# Patient Record
Sex: Female | Born: 1940 | Race: White | Hispanic: No | Smoking: Current every day smoker
Health system: Southern US, Community
[De-identification: ages and names within clinical notes are randomized; demographics above are authoritative.]

## PROBLEM LIST (undated history)

## (undated) DIAGNOSIS — E119 Type 2 diabetes mellitus without complications: Secondary | ICD-10-CM

## (undated) DIAGNOSIS — Z5189 Encounter for other specified aftercare: Secondary | ICD-10-CM

## (undated) DIAGNOSIS — I10 Essential (primary) hypertension: Secondary | ICD-10-CM

## (undated) DIAGNOSIS — I639 Cerebral infarction, unspecified: Secondary | ICD-10-CM

## (undated) DIAGNOSIS — J449 Chronic obstructive pulmonary disease, unspecified: Secondary | ICD-10-CM

## (undated) DIAGNOSIS — M1990 Unspecified osteoarthritis, unspecified site: Secondary | ICD-10-CM

## (undated) HISTORY — DX: Encounter for other specified aftercare: Z51.89

## (undated) HISTORY — DX: Type 2 diabetes mellitus without complications: E11.9

## (undated) HISTORY — DX: Unspecified osteoarthritis, unspecified site: M19.90

## (undated) HISTORY — DX: Essential (primary) hypertension: I10

## (undated) HISTORY — PX: CHOLECYSTECTOMY: SHX55

## (undated) HISTORY — PX: OTHER SURGICAL HISTORY: SHX169

---

## 2015-03-08 ENCOUNTER — Ambulatory Visit (INDEPENDENT_AMBULATORY_CARE_PROVIDER_SITE_OTHER): Payer: Medicare Other

## 2015-03-08 ENCOUNTER — Ambulatory Visit (INDEPENDENT_AMBULATORY_CARE_PROVIDER_SITE_OTHER): Payer: Medicare Other | Admitting: Family Medicine

## 2015-03-08 VITALS — BP 129/72 | HR 52 | Temp 98.1°F | Resp 20 | Ht 63.0 in | Wt 145.0 lb

## 2015-03-08 DIAGNOSIS — J189 Pneumonia, unspecified organism: Secondary | ICD-10-CM

## 2015-03-08 DIAGNOSIS — R05 Cough: Secondary | ICD-10-CM

## 2015-03-08 DIAGNOSIS — E119 Type 2 diabetes mellitus without complications: Secondary | ICD-10-CM | POA: Insufficient documentation

## 2015-03-08 DIAGNOSIS — J441 Chronic obstructive pulmonary disease with (acute) exacerbation: Secondary | ICD-10-CM | POA: Diagnosis not present

## 2015-03-08 DIAGNOSIS — I10 Essential (primary) hypertension: Secondary | ICD-10-CM | POA: Insufficient documentation

## 2015-03-08 DIAGNOSIS — Z8719 Personal history of other diseases of the digestive system: Secondary | ICD-10-CM | POA: Insufficient documentation

## 2015-03-08 DIAGNOSIS — R058 Other specified cough: Secondary | ICD-10-CM

## 2015-03-08 LAB — POCT CBC
GRANULOCYTE PERCENT: 52 % (ref 37–80)
HCT, POC: 37.1 % — AB (ref 37.7–47.9)
Hemoglobin: 12.3 g/dL (ref 12.2–16.2)
Lymph, poc: 1.9 (ref 0.6–3.4)
MCH, POC: 29.2 pg (ref 27–31.2)
MCHC: 33.1 g/dL (ref 31.8–35.4)
MCV: 88 fL (ref 80–97)
MID (CBC): 0.1 (ref 0–0.9)
MPV: 7.4 fL (ref 0–99.8)
PLATELET COUNT, POC: 162 10*3/uL (ref 142–424)
POC GRANULOCYTE: 2.1 (ref 2–6.9)
POC LYMPH %: 46.2 % (ref 10–50)
POC MID %: 1.8 % (ref 0–12)
RBC: 4.21 M/uL (ref 4.04–5.48)
RDW, POC: 13.9 %
WBC: 4.1 10*3/uL — AB (ref 4.6–10.2)

## 2015-03-08 MED ORDER — BENZONATATE 200 MG PO CAPS: 200.0000 mg | ORAL_CAPSULE | Freq: Three times a day (TID) | ORAL | Status: DC | PRN

## 2015-03-08 MED ORDER — IPRATROPIUM-ALBUTEROL 0.5-2.5 (3) MG/3ML IN SOLN
3.0000 mL | Freq: Once | RESPIRATORY_TRACT | Status: AC
  Administered 2015-03-08: 3 mL via RESPIRATORY_TRACT

## 2015-03-08 MED ORDER — METHYLPREDNISOLONE ACETATE 80 MG/ML IJ SUSP
80.0000 mg | Freq: Once | INTRAMUSCULAR | Status: AC
  Administered 2015-03-08: 80 mg via INTRAMUSCULAR

## 2015-03-08 MED ORDER — ALBUTEROL SULFATE HFA 108 (90 BASE) MCG/ACT IN AERS: 2.0000 | INHALATION_SPRAY | Freq: Four times a day (QID) | RESPIRATORY_TRACT | Status: DC | PRN

## 2015-03-08 MED ORDER — PREDNISONE 20 MG PO TABS: 20.0000 mg | ORAL_TABLET | Freq: Every day | ORAL | Status: DC

## 2015-03-08 MED ORDER — LEVOFLOXACIN 500 MG PO TABS: 500.0000 mg | ORAL_TABLET | Freq: Every day | ORAL | Status: DC

## 2015-03-08 MED ORDER — IPRATROPIUM BROMIDE 0.02 % IN SOLN
0.5000 mg | Freq: Once | RESPIRATORY_TRACT | Status: AC
  Administered 2015-03-08: 0.5 mg via RESPIRATORY_TRACT

## 2015-03-08 NOTE — Progress Notes (Signed)
Stefanie Vincent - 74 y.o. female MRN [id number]  Date of birth: 1941/01/06  SUBJECTIVE:  Including CC & ROS.  Patient is a 74 year old Caucasian female with past medical history significant for hypertension, type 2 diabetes poorly controlled, arthritis, seasonal allergies, and 50 year pack history of tobacco abuse. Patient presents office today complaining of upper respiratory congestion, lower respiratory cough productive in nature with thick sputum. History of pneumonia in the past last in September 2015 aren't hospitalization. Patient reports symptoms started on Sunday with increased fatigue, malaise, and cough and respiratory congestion. Continued to have the symptoms along with rhinorrhea, mild fever of 100.1, poor by mouth intake, nausea, and brief episode of vomiting on Monday. Patient is not taking any medications over-the-counter for the symptoms. Denies any severe shortness of breath or dyspnea on exertion, denies any chest pain, does report some pleuritic chest pain with productive cough. Patient has discontinue smoking briefly.   ROS:  Constitutional:  Yes fever, chills, malaise, and fatigue.  Respiratory:  No shortness of breath, yes cough, yes wheezing Cardiovascular:  No palpitations, chest pain or syncope Gastrointestinal: some vomiting, yes nausea, no abdominal pain Review of systems otherwise negative except for what is stated in HPI  HISTORY: Past Medical, Surgical, Social, and Family History Reviewed & Updated per EMR. Pertinent Historical Findings include: See history of present illness  PHYSICAL EXAM:  VS: BP:129/72 mmHg  HR:(!) 52bpm  TEMP:98.1 F (36.7 C)(Oral)  RESP:92 %  HT:5\' 3"  (160 cm)   WT:145 lb (65.772 kg)  BMI:25.7 PHYSICAL EXAM: General:  Alert and oriented, No acute distress.   HENT:  Normocephalic, Oral mucosa is moist. Eyes are equal and reactive to light, normal conjunctivae, normal hearing, mucous membrane is moist, no erythema, no exudate.  Bilateral  ears have fluid with bulging but no erythema, TMs are intact.  Both nasal passages are inflamed, erythematous, with clear drainage and inflamed turbinate.  Sinus passages are tender to palpation.  Submandibular glands are fluctuant mobile and soft.  Respiratory:  Bilateral lung auscultation reveals significant rhonchi, mild expiratory wheezing, mild labored breathing, symmetric lung expansion  Cardiovascular:  Normal rate, Regular rhythm, No murmur, Good pulses equal in all extremities, No edema.   Gastrointestinal:  Soft, Non-tender, Non-distended, Normal bowel sounds, No organomegaly.   Integumentary:  Warm, Dry, No rash.  Really not febrile Neurologic:  Alert, Oriented, No focal defects Psychiatric:  Cooperative, Appropriate mood & affect.    [facility] reading (PRIMARY) by Dr. Ollen Barges 2 view of the chest: Bilateral lower lobe pneumonia specifically in the lingula.   Results for orders placed or performed in visit on 03/08/15  POCT CBC  Result Value Ref Range   WBC 4.1 (A) 4.6 - 10.2 K/uL   Lymph, poc 1.9 0.6 - 3.4   POC LYMPH PERCENT 46.2 10 - 50 %L   MID (cbc) 0.1 0 - 0.9   POC MID % 1.8 0 - 12 %M   POC Granulocyte 2.1 2 - 6.9   Granulocyte percent 52.0 37 - 80 %G   RBC 4.21 4.04 - 5.48 M/uL   Hemoglobin 12.3 12.2 - 16.2 g/dL   HCT, POC 37.1 (A) 37.7 - 47.9 %   MCV 88.0 80 - 97 fL   MCH, POC 29.2 27 - 31.2 pg   MCHC 33.1 31.8 - 35.4 g/dL   RDW, POC 13.9 %   Platelet Count, POC 162 142 - 424 K/uL   MPV 7.4 0 - 99.8 fL    ASSESSMENT &  PLAN:  In office patient was treated with a DuoNeb and treated with 80 mg IM Depo-Medrol. Following DuoNeb treatment patient's oxygen level remained between 93 and 96% with ambulation.  Impression: Bilateral lower lobe pneumonia community-acquired No severe respiratory distress COPD acute exacerbation History of poorly controlled diabetes  Recommendations: -Started patient on community-acquired pneumonia treatment with Levaquin broad-spectrum  given patient's comorbidities type 2 diabetes and COPD. -Started on a prednisone dose obstructive lung disease. Also started on albuterol inhaler treatment every 6 hours. -Given precautions signs of worsening pneumonia and shortness of breath that would prompt emergency room evaluation -Patient will follow-up in 24-48 hours for reevaluation in our office. -CBC obtained shows no leukocytosis, normal H&H.

## 2015-03-08 NOTE — Patient Instructions (Addendum)
Antibiotic treatment with Levaquin 500 mg twice a day for 10 days  Obstructive lung disease treatment with prednisone 20 mg daily for 7 days  Obstructive lung disease treatment with albuterol inhaler give 2 puffs every 6 hours for 5 days  Precautions of symptoms worsening include increased fever, body aches, chills. Worsening cough respiratory breathing with shortness of breath.

## 2015-03-12 ENCOUNTER — Inpatient Hospital Stay (HOSPITAL_COMMUNITY)
Admission: EM | Admit: 2015-03-12 | Discharge: 2015-03-14 | DRG: 190 | Disposition: A | Discharge status: Home / Self Care | Payer: Medicare Other | Attending: Internal Medicine | Admitting: Internal Medicine

## 2015-03-12 ENCOUNTER — Encounter (HOSPITAL_COMMUNITY): Payer: Self-pay | Admitting: Emergency Medicine

## 2015-03-12 ENCOUNTER — Emergency Department (HOSPITAL_COMMUNITY): Payer: Medicare Other

## 2015-03-12 DIAGNOSIS — E11649 Type 2 diabetes mellitus with hypoglycemia without coma: Secondary | ICD-10-CM | POA: Diagnosis present

## 2015-03-12 DIAGNOSIS — E785 Hyperlipidemia, unspecified: Secondary | ICD-10-CM | POA: Diagnosis present

## 2015-03-12 DIAGNOSIS — E871 Hypo-osmolality and hyponatremia: Secondary | ICD-10-CM | POA: Diagnosis present

## 2015-03-12 DIAGNOSIS — F17210 Nicotine dependence, cigarettes, uncomplicated: Secondary | ICD-10-CM | POA: Diagnosis present

## 2015-03-12 DIAGNOSIS — M1990 Unspecified osteoarthritis, unspecified site: Secondary | ICD-10-CM | POA: Diagnosis present

## 2015-03-12 DIAGNOSIS — Z9049 Acquired absence of other specified parts of digestive tract: Secondary | ICD-10-CM | POA: Diagnosis present

## 2015-03-12 DIAGNOSIS — T502X5A Adverse effect of carbonic-anhydrase inhibitors, benzothiadiazides and other diuretics, initial encounter: Secondary | ICD-10-CM | POA: Diagnosis present

## 2015-03-12 DIAGNOSIS — Z794 Long term (current) use of insulin: Secondary | ICD-10-CM

## 2015-03-12 DIAGNOSIS — I10 Essential (primary) hypertension: Secondary | ICD-10-CM | POA: Diagnosis present

## 2015-03-12 DIAGNOSIS — J441 Chronic obstructive pulmonary disease with (acute) exacerbation: Secondary | ICD-10-CM | POA: Diagnosis not present

## 2015-03-12 DIAGNOSIS — J189 Pneumonia, unspecified organism: Secondary | ICD-10-CM | POA: Diagnosis present

## 2015-03-12 LAB — CBC WITH DIFFERENTIAL/PLATELET
Basophils Absolute: 0 10*3/uL (ref 0.0–0.1)
Basophils Relative: 0 % (ref 0–1)
EOS ABS: 0 10*3/uL (ref 0.0–0.7)
EOS PCT: 0 % (ref 0–5)
HCT: 35.7 % — ABNORMAL LOW (ref 36.0–46.0)
HEMOGLOBIN: 12.1 g/dL (ref 12.0–15.0)
LYMPHS ABS: 1.1 10*3/uL (ref 0.7–4.0)
Lymphocytes Relative: 16 % (ref 12–46)
MCH: 28.9 pg (ref 26.0–34.0)
MCHC: 33.9 g/dL (ref 30.0–36.0)
MCV: 85.2 fL (ref 78.0–100.0)
MONO ABS: 0.3 10*3/uL (ref 0.1–1.0)
MONOS PCT: 5 % (ref 3–12)
Neutro Abs: 5.2 10*3/uL (ref 1.7–7.7)
Neutrophils Relative %: 79 % — ABNORMAL HIGH (ref 43–77)
Platelets: 179 10*3/uL (ref 150–400)
RBC: 4.19 MIL/uL (ref 3.87–5.11)
RDW: 13.3 % (ref 11.5–15.5)
WBC: 6.6 10*3/uL (ref 4.0–10.5)

## 2015-03-12 LAB — BASIC METABOLIC PANEL
Anion gap: 12 (ref 5–15)
BUN: 15 mg/dL (ref 6–20)
CO2: 26 mmol/L (ref 22–32)
Calcium: 9 mg/dL (ref 8.9–10.3)
Chloride: 90 mmol/L — ABNORMAL LOW (ref 101–111)
Creatinine, Ser: 0.96 mg/dL (ref 0.44–1.00)
GFR calc non Af Amer: 56 mL/min — ABNORMAL LOW (ref >60)
GLUCOSE: 207 mg/dL — AB (ref 70–99)
POTASSIUM: 3.7 mmol/L (ref 3.5–5.1)
SODIUM: 128 mmol/L — AB (ref 135–145)

## 2015-03-12 LAB — BRAIN NATRIURETIC PEPTIDE: B NATRIURETIC PEPTIDE 5: 175.1 pg/mL — AB (ref 0.0–100.0)

## 2015-03-12 LAB — I-STAT TROPONIN, ED: Troponin i, poc: 0 ng/mL (ref 0.00–0.08)

## 2015-03-12 LAB — GLUCOSE, CAPILLARY
GLUCOSE-CAPILLARY: 228 mg/dL — AB (ref 70–99)
Glucose-Capillary: 295 mg/dL — ABNORMAL HIGH (ref 70–99)

## 2015-03-12 MED ORDER — ALBUTEROL SULFATE (2.5 MG/3ML) 0.083% IN NEBU: 2.5000 mg | INHALATION_SOLUTION | RESPIRATORY_TRACT | Status: DC | PRN

## 2015-03-12 MED ORDER — PREDNISONE 20 MG PO TABS
40.0000 mg | ORAL_TABLET | Freq: Every day | ORAL | Status: DC
  Administered 2015-03-13 – 2015-03-14 (×2): 40 mg via ORAL
  Filled 2015-03-12 (×4): qty 2

## 2015-03-12 MED ORDER — METHYLPREDNISOLONE SODIUM SUCC 125 MG IJ SOLR
125.0000 mg | Freq: Once | INTRAMUSCULAR | Status: AC
  Administered 2015-03-12: 125 mg via INTRAVENOUS
  Filled 2015-03-12: qty 2

## 2015-03-12 MED ORDER — ATENOLOL 50 MG PO TABS
50.0000 mg | ORAL_TABLET | Freq: Every day | ORAL | Status: DC
  Administered 2015-03-13 – 2015-03-14 (×2): 50 mg via ORAL
  Filled 2015-03-12 (×2): qty 1

## 2015-03-12 MED ORDER — ATENOLOL-CHLORTHALIDONE 50-25 MG PO TABS: 1.0000 | ORAL_TABLET | Freq: Every day | ORAL | Status: DC

## 2015-03-12 MED ORDER — TIOTROPIUM BROMIDE MONOHYDRATE 18 MCG IN CAPS: 18.0000 ug | ORAL_CAPSULE | Freq: Every day | RESPIRATORY_TRACT | Status: DC

## 2015-03-12 MED ORDER — INSULIN ASPART 100 UNIT/ML ~~LOC~~ SOLN
0.0000 [IU] | SUBCUTANEOUS | Status: DC
  Administered 2015-03-12: 3 [IU] via SUBCUTANEOUS
  Administered 2015-03-12 – 2015-03-13 (×2): 5 [IU] via SUBCUTANEOUS

## 2015-03-12 MED ORDER — SODIUM CHLORIDE 0.9 % IV BOLUS (SEPSIS)
1000.0000 mL | Freq: Once | INTRAVENOUS | Status: AC
  Administered 2015-03-12: 1000 mL via INTRAVENOUS

## 2015-03-12 MED ORDER — SIMVASTATIN 20 MG PO TABS
20.0000 mg | ORAL_TABLET | Freq: Every day | ORAL | Status: DC
  Administered 2015-03-12 – 2015-03-14 (×3): 20 mg via ORAL
  Filled 2015-03-12 (×3): qty 1

## 2015-03-12 MED ORDER — IPRATROPIUM-ALBUTEROL 0.5-2.5 (3) MG/3ML IN SOLN
3.0000 mL | RESPIRATORY_TRACT | Status: DC
  Administered 2015-03-12 – 2015-03-13 (×3): 3 mL via RESPIRATORY_TRACT
  Filled 2015-03-12 (×4): qty 3

## 2015-03-12 MED ORDER — ENOXAPARIN SODIUM 40 MG/0.4ML ~~LOC~~ SOLN
40.0000 mg | SUBCUTANEOUS | Status: DC
  Administered 2015-03-12 – 2015-03-14 (×3): 40 mg via SUBCUTANEOUS
  Filled 2015-03-12 (×3): qty 0.4

## 2015-03-12 MED ORDER — CHLORTHALIDONE 25 MG PO TABS
25.0000 mg | ORAL_TABLET | Freq: Every day | ORAL | Status: DC
  Filled 2015-03-12: qty 1

## 2015-03-12 MED ORDER — IPRATROPIUM-ALBUTEROL 0.5-2.5 (3) MG/3ML IN SOLN
3.0000 mL | RESPIRATORY_TRACT | Status: DC
  Administered 2015-03-12: 3 mL via RESPIRATORY_TRACT
  Filled 2015-03-12: qty 3

## 2015-03-12 NOTE — H&P (Signed)
History and Physical  Date: 03/12/2015               Patient Name:  Stefanie Vincent MRN: [id number]  DOB: [birth year] Age / Sex: 74 y.o., female   PCP: Provider Not In System         Medical Service: Internal Medicine Teaching Service         Attending Physician: Dr. Madilyn Fireman, MD    First Contact: Reynaldo Minium Pager: [phone]  Second Contact: Dr. Ronnald Ramp Pager: [phone]       After Hours (After 5p/  First Contact Pager: [phone]  weekends / holidays): Second Contact Pager: [phone]   Chief Complaint: shortness of breath  History of Present Illness: Stefanie Vincent is a 74 y.o. female with HTN, T2DM, and a 60 pack-year smoking hx who presents with an 8-day history of SOB and cough. The pt reports DOE as well as baseline SOB. She reports fever for the first 1-2 days of her illness only. She was seen 4 days ago in Urgent Care, where she was sent out with prednisone, levofloxacin, and albuterol inhaler for presumed COPD exacerbation. Her cough was initially productive of thick sputum but since being seen at Urgent Care she denies productive cough. She reports her overall SOB has been stable over the past 8 days. She endorses sick contacts (grandson) with similar illness.  Per the patient's son, the pt has also had difficulty with correct administration of her insulin lately. He reports that at times she will intend to take 3 units and will inject 30, and that on multiple occasions she has become confused and tremulous after administration of insulin. The pt reports one episode of passing out recently; unclear if this was associated with insulin administration.  Meds: Current Facility-Administered Medications  Medication Dose Route Frequency Provider Last Rate Last Dose  . ipratropium-albuterol (DUONEB) 0.5-2.5 (3) MG/3ML nebulizer solution 3 mL  3 mL Nebulization Q4H Benjamin Cartner, PA-C   3 mL at 03/12/15 1436  . ipratropium-albuterol (DUONEB) 0.5-2.5 (3) MG/3ML nebulizer solution 3 mL   3 mL Nebulization Q4H Comer Locket, PA-C   3 mL at 03/12/15 1541   Current Outpatient Prescriptions  Medication Sig Dispense Refill  . albuterol (PROVENTIL HFA;VENTOLIN HFA) 108 (90 BASE) MCG/ACT inhaler Inhale 2 puffs into the lungs every 6 (six) hours as needed for wheezing or shortness of breath (cough, shortness of breath or wheezing.). 1 Inhaler 1  . atenolol-chlorthalidone (TENORETIC) 50-25 MG per tablet Take 1 tablet by mouth daily.    . benzonatate (TESSALON) 200 MG capsule Take 1 capsule (200 mg total) by mouth 3 (three) times daily as needed for cough. 20 capsule 0  . insulin aspart protamine- aspart (NOVOLOG MIX 70/30) (70-30) 100 UNIT/ML injection Inject 30-40 Units into the skin 2 (two) times daily with a meal. TAKES 40 UNITS IN AM AND 30 UNITS IN PM    . levofloxacin (LEVAQUIN) 500 MG tablet Take 1 tablet (500 mg total) by mouth daily. 10 tablet 0  . loperamide (IMODIUM A-D) 2 MG tablet Take 2 mg by mouth 4 (four) times daily as needed for diarrhea or loose stools.    Marland Kitchen loratadine (CLARITIN) 10 MG tablet Take 10 mg by mouth daily as needed for allergies.    . Potassium 99 MG TABS Take 1 tablet by mouth daily as needed (FOR CRAMPING).    Marland Kitchen predniSONE (DELTASONE) 20 MG tablet Take 1 tablet (20 mg total) by mouth daily with breakfast. 7 tablet 0  .  rOPINIRole (REQUIP) 0.5 MG tablet Take 0.5 mg by mouth at bedtime as needed (FOR RESTLESS LEGS).    Marland Kitchen simvastatin (ZOCOR) 20 MG tablet Take 20 mg by mouth daily.    . vitamin D, CHOLECALCIFEROL, 400 UNITS tablet Take 400 Units by mouth daily.      Allergies: Allergies as of 03/12/2015  . (No Known Allergies)   Past Medical History  Diagnosis Date  . Arthritis   . Blood transfusion without reported diagnosis   . Hypertension   . Diabetes mellitus without complication    Past Surgical History  Procedure Laterality Date  . Cholecystectomy     Family History  Problem Relation Age of Onset  . Stroke Mother   . Cancer Father     History   Social History  . Marital Status: Widowed    Spouse Name: N/A  . Number of Children: N/A  . Years of Education: N/A   Occupational History  . Not on file.   Social History Main Topics  . Smoking status: Current Every Day Smoker -- 1.00 packs/day for 60 years    Types: Cigarettes  . Smokeless tobacco: Never Used  . Alcohol Use: No  . Drug Use: No  . Sexual Activity: Not on file   Other Topics Concern  . Not on file   Social History Narrative    Review of Systems: Pertinent items are noted in HPI.  Physical Exam: Blood pressure 154/62, pulse 57, temperature 97.9 F (36.6 C), temperature source Oral, resp. rate 17, height 5\' 2"  (1.575 m), weight 145 lb (65.772 kg), SpO2 92 %. BP 154/62 mmHg  Pulse 57  Temp(Src) 97.9 F (36.6 C) (Oral)  Resp 17  Ht 5\' 2"  (1.575 m)  Wt 145 lb (65.772 kg)  BMI 26.51 kg/m2  SpO2 92%  General Appearance:    Alert, conversant woman in no apparent distress, intermittently coughing  Head:    Normocephalic, without obvious abnormality, atraumatic  Eyes:    PERRL, conjunctiva/corneas clear  Throat:   Lips, mucosa, and tongue normal; teeth and gums normal  Neck:   No JVD, no hepatojugular reflux  Lungs:     Scattered rales and wheezing. Cough with deep inspiration   Heart:    Regular rate and rhythm, S1 and S2 normal, no murmur, rub   or gallop  Abdomen:     Soft, NT/ND  Extremities:   No peripheral edema   Lab results: Basic Metabolic Panel:  Recent Labs  03/12/15 1301  NA 128*  K 3.7  CL 90*  CO2 26  GLUCOSE 207*  BUN 15  CREATININE 0.96  CALCIUM 9.0   CBC:  Recent Labs  03/12/15 1301  WBC 6.6  NEUTROABS 5.2  HGB 12.1  HCT 35.7*  MCV 85.2  PLT 179   Imaging results:  Dg Chest 2 View  03/12/2015   CLINICAL DATA:  Weakness.  Productive cough.  EXAM: CHEST  2 VIEW  COMPARISON:  None.  FINDINGS: Normal sized heart. Clear lungs. Minimal diffuse peribronchial thickening and accentuation of the interstitial  markings. Mild scoliosis. Upper lumbar spine degenerative changes. Cholecystectomy clips.  IMPRESSION: Minimal bronchitic changes.   Electronically Signed   By: Claudie Revering M.D.   On: 03/12/2015 13:29   Other results: EKG: normal sinus rhythm, 59 BPM.  Assessment & Plan by Problem: Active Problems:   COPD exacerbation  COPD Exacerbation: Pt has 60 pack-year smoking history and approximately 1 similar episode per year. She has never had PFTs.  She is s/p 5 days of daily 500 mg levofloxacin and 20 mg prednisone with improvement in her cough but no change in her shortness of breath. No chest pain, no DVT sx, no hemoptysis, no tachycardia make PE unlikely. CXR demonstrates minimal bronchitic changes, no effusion/mass/infiltrate. - Methylprednisolone 125 mg x 1, then prednisone burst 40 mg x 5 days - Duonebs q4hrs - Spiriva inhaler daily, to be continued after discharge - Albuterol rescue inhaler PRN - Continuous SpO2 monitoring; O2 via [place] for SpO2 > 92% - Pt has no local primary care - establish primary care for PFTs after d/c  Type II Diabetes Mellitus: Pt previously taking Novolog 70/30, 30-40 units BID with meals. Per pt's son, pt does not have good understanding of how much insulin to take and has had episodes of confusion and tremor after insulin injection. - Hgb A1c - SSI-R while admitted, reassess insulin requirement - Diabetes educator c/s for teaching  Hyponatremia: Pt presented with serum sodium of 128. s/p 1L NS in ED. Etiology unclear - pt clinically euvolemic, potentially SIADH. No previous serum sodium values in EHR. No e/o intraparenchymal lung pathology a/w SIADH. - Serum osmolality, urine osmolality, urine sodium, re-check BMP  Hypertension: Continue home Tenoretic 50-25 mg daily  Dispo: Disposition is deferred at this time, awaiting improvement of current medical problems. Anticipated discharge in approximately 1-2 day(s).   The patient does not have a current PCP (Provider  Not In System) and does need an [facility] hospital follow-up appointment after discharge.  The patient does not have transportation limitations that hinder transportation to clinic appointments.  Signed: Susa Day, Med Student 03/12/2015, 4:44 PM

## 2015-03-12 NOTE — ED Provider Notes (Signed)
CSN: [id number]     Arrival date & time 03/12/15  1221 History   First MD Initiated Contact with Patient 03/12/15 1234     Chief Complaint  Patient presents with  . Pneumonia     (Consider location/radiation/quality/duration/timing/severity/associated sxs/prior Treatment) HPI Stefanie Vincent is a 74 y.o. female with a history of type 2 diabetes, COPD who comes in for evaluation for pneumonia. Patient was seen at urgent care facility on 5/4 and diagnosed with "pneumonia. She was started on Levaquin, steroid taper and discharged with albuterol inhaler. Patient states she has not improved and is still having a productive cough, shortness of breath, rhinorrhea and nasal congestion and "general cruminess". States that she has had pneumonia in the past and has been hospitalized for it. Denies fevers chills, chest pain, nausea or vomiting, leg swelling, dizziness, significantly, rash, headache, facial pain. No other aggravating or modifying factors.  Past Medical History  Diagnosis Date  . Arthritis   . Blood transfusion without reported diagnosis   . Hypertension   . Diabetes mellitus without complication    Past Surgical History  Procedure Laterality Date  . Cholecystectomy     Family History  Problem Relation Age of Onset  . Stroke Mother   . Cancer Father    History  Substance Use Topics  . Smoking status: Current Every Day Smoker -- 1.00 packs/day for 60 years    Types: Cigarettes  . Smokeless tobacco: Never Used  . Alcohol Use: No   OB History    No data available     Review of Systems A 10 point review of systems was completed and was negative except for pertinent positives and negatives as mentioned in the history of present illness     Allergies  Review of patient's allergies indicates no known allergies.  Home Medications   Prior to Admission medications   Medication Sig Start Date End Date Taking? Authorizing Provider  albuterol (PROVENTIL HFA;VENTOLIN HFA) 108  (90 BASE) MCG/ACT inhaler Inhale 2 puffs into the lungs every 6 (six) hours as needed for wheezing or shortness of breath (cough, shortness of breath or wheezing.). 03/08/15  Yes Deanna M Didiano, DO  atenolol-chlorthalidone (TENORETIC) 50-25 MG per tablet Take 1 tablet by mouth daily.   Yes Historical Provider, MD  benzonatate (TESSALON) 200 MG capsule Take 1 capsule (200 mg total) by mouth 3 (three) times daily as needed for cough. 03/08/15  Yes Deanna M Didiano, DO  insulin aspart protamine- aspart (NOVOLOG MIX 70/30) (70-30) 100 UNIT/ML injection Inject 30-40 Units into the skin 2 (two) times daily with a meal. TAKES 40 UNITS IN AM AND 30 UNITS IN PM   Yes Historical Provider, MD  levofloxacin (LEVAQUIN) 500 MG tablet Take 1 tablet (500 mg total) by mouth daily. 03/08/15  Yes Deanna M Didiano, DO  loperamide (IMODIUM A-D) 2 MG tablet Take 2 mg by mouth 4 (four) times daily as needed for diarrhea or loose stools.   Yes Historical Provider, MD  loratadine (CLARITIN) 10 MG tablet Take 10 mg by mouth daily as needed for allergies.   Yes Historical Provider, MD  Potassium 99 MG TABS Take 1 tablet by mouth daily as needed (FOR CRAMPING).   Yes Historical Provider, MD  predniSONE (DELTASONE) 20 MG tablet Take 1 tablet (20 mg total) by mouth daily with breakfast. 03/08/15  Yes Deanna M Didiano, DO  rOPINIRole (REQUIP) 0.5 MG tablet Take 0.5 mg by mouth at bedtime as needed (FOR RESTLESS LEGS).  Yes Historical Provider, MD  simvastatin (ZOCOR) 20 MG tablet Take 20 mg by mouth daily.   Yes Historical Provider, MD  vitamin D, CHOLECALCIFEROL, 400 UNITS tablet Take 400 Units by mouth daily.   Yes Historical Provider, MD   BP 137/53 mmHg  Pulse 58  Temp(Src) 97.9 F (36.6 C) (Oral)  Resp 17  Ht 5\' 2"  (1.575 m)  Wt 145 lb (65.772 kg)  BMI 26.51 kg/m2  SpO2 91% Physical Exam  Constitutional: She is oriented to person, place, and time. She appears well-developed and well-nourished. No distress.  HENT:  Head:  Normocephalic and atraumatic.  Mouth/Throat: Oropharynx is clear and moist.  Eyes: Conjunctivae are normal. Pupils are equal, round, and reactive to light. Right eye exhibits no discharge. Left eye exhibits no discharge. No scleral icterus.  Neck: Normal range of motion. Neck supple.  Cardiovascular: Normal rate, regular rhythm and normal heart sounds.   Pulmonary/Chest: Effort normal. No respiratory distress. She has wheezes. She has rales.  Abdominal: Soft. She exhibits no distension and no mass. There is no tenderness. There is no rebound and no guarding.  Musculoskeletal: Normal range of motion. She exhibits no edema or tenderness.  Neurological: She is alert and oriented to person, place, and time.  Cranial Nerves II-XII grossly intact  Skin: Skin is warm and dry. No rash noted. She is not diaphoretic.  Psychiatric: She has a normal mood and affect.  Nursing note and vitals reviewed.   ED Course  Procedures (including critical care time) Labs Review Labs Reviewed  BASIC METABOLIC PANEL - Abnormal; Notable for the following:    Sodium 128 (*)    Chloride 90 (*)    Glucose, Bld 207 (*)    GFR calc non Af Amer 56 (*)    All other components within normal limits  CBC WITH DIFFERENTIAL/PLATELET - Abnormal; Notable for the following:    HCT 35.7 (*)    Neutrophils Relative % 79 (*)    All other components within normal limits  BRAIN NATRIURETIC PEPTIDE - Abnormal; Notable for the following:    B Natriuretic Peptide 175.1 (*)    All other components within normal limits  I-STAT TROPOININ, ED    Imaging Review Dg Chest 2 View  03/12/2015   CLINICAL DATA:  Weakness.  Productive cough.  EXAM: CHEST  2 VIEW  COMPARISON:  None.  FINDINGS: Normal sized heart. Clear lungs. Minimal diffuse peribronchial thickening and accentuation of the interstitial markings. Mild scoliosis. Upper lumbar spine degenerative changes. Cholecystectomy clips.  IMPRESSION: Minimal bronchitic changes.    Electronically Signed   By: Claudie Revering M.D.   On: 03/12/2015 13:29     EKG Interpretation   Date/Time:  Sunday Mar 12 2015 12:49:11 EDT Ventricular Rate:  59 PR Interval:  160 QRS Duration: 90 QT Interval:  435 QTC Calculation: 431 R Axis:   60 Text Interpretation:  Sinus rhythm Low voltage, precordial leads No old  tracing to compare Confirmed by [facility]  MD, TREY ([id number]) on 03/12/2015  3:33:41 PM     Meds given in ED:  Medications  ipratropium-albuterol (DUONEB) 0.5-2.5 (3) MG/3ML nebulizer solution 3 mL (3 mLs Nebulization Given 03/12/15 1436)  ipratropium-albuterol (DUONEB) 0.5-2.5 (3) MG/3ML nebulizer solution 3 mL (3 mLs Nebulization Given 03/12/15 1541)  sodium chloride 0.9 % bolus 1,000 mL (1,000 mLs Intravenous New Bag/Given 03/12/15 1437)  methylPREDNISolone sodium succinate (SOLU-MEDROL) 125 mg/2 mL injection 125 mg (125 mg Intravenous Given 03/12/15 1541)    New Prescriptions  No medications on file   Filed Vitals:   03/12/15 1245 03/12/15 1330 03/12/15 1430 03/12/15 1500  BP: 110/45 99/68 146/59 137/53  Pulse: 56 51 51 58  Temp:      TempSrc:      Resp:   20 17  Height:      Weight:      SpO2: 90% 93% 92% 91%    MDM  Vitals stable, afebrile. Oxygen saturations in low 90s. Pt resting comfortably in ED. She maintained oxygen saturations during ambulation she was increasingly dyspneic during ambulation and somewhat during conversation. Diffuse adventitious lung sounds. Labwork--labs noncontributory Imaging--chest x-ray shows  bronchitic changes.  DDX--patient feels like she is not improving on outpatient Levaquin. Persistently dyspneic with wheezing on exam. Patient with likely COPD exacerbation. Has received 2 breathing treatments, 125 mg Solu-Medrol in the ED. Will consult internal medicine for admission for COPD exacerbation. Internal medicine to see in the ED. Patient admitted.  Prior to patient admission, I discussed and reviewed this case with my attending,  Dr. Doy Mince who also saw and evaluated the patient.  Final diagnoses:  COPD exacerbation       Comer Locket, PA-C 03/13/15 [place], MD 03/16/15 [phone]

## 2015-03-12 NOTE — ED Notes (Signed)
Stefanie Vincent, [place] at bedside.

## 2015-03-12 NOTE — H&P (Signed)
Date: 03/12/2015               Patient Name:  Stefanie Vincent MRN: [id number]  DOB: 1941/03/09 Age / Sex: 74 y.o., female   PCP: Provider Not In System         Medical Service: Internal Medicine Teaching Service         Attending Physician: Dr. Madilyn Fireman, MD    First Contact: Reynaldo Minium, MS 4 Pager: [phone]  Second Contact: Dr. Ronnald Ramp Pager: [phone]       After Hours (After 5p/  First Contact Pager: [phone]  weekends / holidays): Second Contact Pager: [phone]   Chief Complaint: Short of breath  History of Present Illness: 74 y/o F w/ PMHx of DM type II, HTN, COPD, and 66 pack/year smoking history, presents to the ED w/ worsening shortness of breath. Patient states she had URI symptoms about 7 days ago (grandson sick contact) and developed worsening cough, SOB, and significant DOE. Patient states she went to urgent care about 3-4 days ago at which time she was told she had a "pneumonia" and was sent home w/ Levaquin, Prednisone 20 mg daily, and a Ventolin rescue inhaler. Patient states her cough improved, however, she still feels quite short of breath especially w/ ambulation. She denies increased sputum volume or purulence, no fever, chills, nausea, vomiting, diarrhea, chest pain, palpitations, dizziness, lightheadedness, PND, orthopnea, or recent weight gain.   In ED, patient noted to have decreased SpO2 to 88-92% on room air at rest and further decreased w/ ambulation per ED provider. Given breathing treatments x2 and SoluMedrol 125 mg x1.  Also of note, patient takes 70/30 Insulin at home, son states she has had several hypoglycemic episodes involving tremulousness, diaphoresis, and even change in mental status and difficulty ambulating. Patient was apparently started on Insulin by another provider and not given adequate instruction on use and appropriate dosing.   Meds: Current Facility-Administered Medications  Medication Dose Route Frequency Provider Last Rate Last Dose   . ipratropium-albuterol (DUONEB) 0.5-2.5 (3) MG/3ML nebulizer solution 3 mL  3 mL Nebulization Q4H Benjamin Cartner, PA-C   3 mL at 03/12/15 1436  . ipratropium-albuterol (DUONEB) 0.5-2.5 (3) MG/3ML nebulizer solution 3 mL  3 mL Nebulization Q4H Comer Locket, PA-C   3 mL at 03/12/15 1541   Current Outpatient Prescriptions  Medication Sig Dispense Refill  . albuterol (PROVENTIL HFA;VENTOLIN HFA) 108 (90 BASE) MCG/ACT inhaler Inhale 2 puffs into the lungs every 6 (six) hours as needed for wheezing or shortness of breath (cough, shortness of breath or wheezing.). 1 Inhaler 1  . atenolol-chlorthalidone (TENORETIC) 50-25 MG per tablet Take 1 tablet by mouth daily.    . benzonatate (TESSALON) 200 MG capsule Take 1 capsule (200 mg total) by mouth 3 (three) times daily as needed for cough. 20 capsule 0  . insulin aspart protamine- aspart (NOVOLOG MIX 70/30) (70-30) 100 UNIT/ML injection Inject 30-40 Units into the skin 2 (two) times daily with a meal. TAKES 40 UNITS IN AM AND 30 UNITS IN PM    . levofloxacin (LEVAQUIN) 500 MG tablet Take 1 tablet (500 mg total) by mouth daily. 10 tablet 0  . loperamide (IMODIUM A-D) 2 MG tablet Take 2 mg by mouth 4 (four) times daily as needed for diarrhea or loose stools.    Marland Kitchen loratadine (CLARITIN) 10 MG tablet Take 10 mg by mouth daily as needed for allergies.    . Potassium 99 MG TABS Take 1 tablet by mouth daily  as needed (FOR CRAMPING).    Marland Kitchen predniSONE (DELTASONE) 20 MG tablet Take 1 tablet (20 mg total) by mouth daily with breakfast. 7 tablet 0  . rOPINIRole (REQUIP) 0.5 MG tablet Take 0.5 mg by mouth at bedtime as needed (FOR RESTLESS LEGS).    Marland Kitchen simvastatin (ZOCOR) 20 MG tablet Take 20 mg by mouth daily.    . vitamin D, CHOLECALCIFEROL, 400 UNITS tablet Take 400 Units by mouth daily.      Allergies: Allergies as of 03/12/2015  . (No Known Allergies)   Past Medical History  Diagnosis Date  . Arthritis   . Blood transfusion without reported diagnosis     . Hypertension   . Diabetes mellitus without complication    Past Surgical History  Procedure Laterality Date  . Cholecystectomy     Family History  Problem Relation Age of Onset  . Stroke Mother   . Cancer Father    History   Social History  . Marital Status: Widowed    Spouse Name: N/A  . Number of Children: N/A  . Years of Education: N/A   Occupational History  . Not on file.   Social History Main Topics  . Smoking status: Current Every Day Smoker -- 1.00 packs/day for 60 years    Types: Cigarettes  . Smokeless tobacco: Never Used  . Alcohol Use: No  . Drug Use: No  . Sexual Activity: Not on file   Other Topics Concern  . Not on file   Social History Narrative   Review of Systems  General: Positive for fatigue. Denies fever, diaphoresis, appetite change.  Respiratory: Positive for SOB/DOE, and wheezing. Denies cough, PND, or orthopnea.  Cardiovascular: Denies chest pain and palpitations.  Gastrointestinal: Denies nausea, vomiting, abdominal pain, and diarrhea Musculoskeletal: Denies myalgias, arthralgias, back pain, and gait problem.  Neurological: Denies dizziness, syncope, weakness, lightheadedness, and headaches.  Psychiatric/Behavioral: Denies mood changes, sleep disturbance, and agitation.   Physical Exam: Blood pressure 137/53, pulse 58, temperature 97.9 F (36.6 C), temperature source Oral, resp. rate 17, height 5\' 2"  (1.575 m), weight 145 lb (65.772 kg), SpO2 91 %.  General: Elderly white female, alert, cooperative, NAD. HEENT: PERRL, EOMI. Moist mucus membranes Neck: Full range of motion without pain, supple, no lymphadenopathy or carotid bruits Lungs: Air entry equal bilaterally, scattered rhonchi and wheezes. Transmitted sounds from upper airway.  Heart: RRR, no murmurs, gallops, or rubs Abdomen: Soft, non-tender, non-distended, BS + Extremities: No cyanosis, clubbing, or edema Neurologic: Alert & oriented x3, cranial nerves II-XII intact,  strength grossly intact, sensation intact to light touch   Lab results: Basic Metabolic Panel:  Recent Labs  03/12/15 1301  NA 128*  K 3.7  CL 90*  CO2 26  GLUCOSE 207*  BUN 15  CREATININE 0.96  CALCIUM 9.0   CBC:  Recent Labs  03/12/15 1301  WBC 6.6  NEUTROABS 5.2  HGB 12.1  HCT 35.7*  MCV 85.2  PLT 179    Imaging results:  Dg Chest 2 View  03/12/2015   CLINICAL DATA:  Weakness.  Productive cough.  EXAM: CHEST  2 VIEW  COMPARISON:  None.  FINDINGS: Normal sized heart. Clear lungs. Minimal diffuse peribronchial thickening and accentuation of the interstitial markings. Mild scoliosis. Upper lumbar spine degenerative changes. Cholecystectomy clips.  IMPRESSION: Minimal bronchitic changes.   Electronically Signed   By: Claudie Revering M.D.   On: 03/12/2015 13:29    Other results: EKG: NSR; low voltage  Assessment & Plan by Problem:  74 y/o F w/ PMHx of HTN, DM type II, COPD, and extensive smoking history, admitted for COPD exacerbation.   COPD Exacerbation: Patient w/ recent URI symptoms, treated at urgent care for pneumonia and COPD exacerbation 5 days ago, sent home w/ Prednisone 20 mg daily + Levaquin. Has taken ~5 days of Levaquin. No longer w/ cough. Denies increased sputum production or purulence. States she has about 1-2 exacerbations of her COPD per year, typically brought on by upper respiratory illness. Denies fever, chills, vomiting, myalgias, or diarrhea. Also denies recent increase in weight, PND, or orthopnea. CXR in ED significant only for minor bronchitic changes. No findings suggestive of infiltrate or CHF. Patient w/ no tachycardia, no lower extremity swelling. On exam, scattered wheezes and rhonchi. SpO2 88% on room air, improved to 100% w/ 2-3L while examining. Given SoluMedrol 125 mg once in ED. No leukocytosis.  -Admit to med-surg -Continue Prednisone 40 mg daily starting tomorrow -Hold further ABx therapy; given CXR findings and clinical exam, do not feel  this is necessary -Duonebs q4h + Albuterol neb q2h prn for SOB -Start Spiriva qd -Supplemental O2 prn; SpO2 >92%. No evidence of chronic CO2 retention -Ambulate w/out O2 in AM -Recommend outpatient low dose CT scan of the chest given age and 55 pack/year smoking history  -Outpatient PFT's; has not had in the past  Hyponatremia: Na 128 on admission. No previous labs to suggest hyponatremia in the past. Patient does admit to recent fatigue and weakness. No apparent medications that could be responsible for hyponatremia. Chlorthalidone has been shown to cause low Na in <1% of patients. Patient appears euvolemic, no previous h/o liver disease, Cr wnl. Some concern of SIADH in the setting of Lung CA given her 59 pack/year smoking history, however, this is relatively unlikely.  -Repeat BMP in AM -Urine Na, urine Osm -Serum Osm  DM type II: Per son, patient has had several issues w/ hypoglycemia since started on Insulin 70/30 and says she has received little to no education on how to use insulin properly at home. States she is supposed to take 30/40 units, however, son is sure she is only to take 3/4 units. He states she has had tremulousness, diaphoresis, and change in mental status following insulin use in the recent past.  -HOLD Insulin 70/30 for now -ISS-S w/ CBG's AC/HS -HbA1c pending -Consult diabetes coordinator for assistance w/ insulin use (if necessary pending CBG trend) and hypoglycemia awareness.  -May also consider only po medications if CBG's + HBA1c are moderately well controlled.   HTN: Moderately elevated BP on admission.  -Continue Atenolol-Chlorthalidone 50-25 mg daily.   HLD: Stable.  -Continue Zocor  DVT/PE PPx: Lovenox [place]  Dispo: Disposition is deferred at this time, awaiting improvement of current medical problems. Anticipated discharge in approximately 1-2 day(s).   The patient does not have a current PCP (Provider Not In System) and does not need an [facility] hospital  follow-up appointment after discharge.  The patient does not have transportation limitations that hinder transportation to clinic appointments.  Signed: Corky Sox, MD 03/12/2015, 4:09 PM

## 2015-03-12 NOTE — Progress Notes (Signed)
Report attempted. Waited for transfer to RN. No answer.

## 2015-03-12 NOTE — ED Notes (Signed)
Pt c/o has pneumonia and not getting any better.

## 2015-03-12 NOTE — ED Notes (Signed)
Report attempted 

## 2015-03-12 NOTE — ED Notes (Signed)
Ambulated in hallway. 02 @ 97% HR 67. Did very well

## 2015-03-13 LAB — BASIC METABOLIC PANEL
Anion gap: 12 (ref 5–15)
BUN: 16 mg/dL (ref 6–20)
CHLORIDE: 89 mmol/L — AB (ref 101–111)
CO2: 25 mmol/L (ref 22–32)
Calcium: 8.9 mg/dL (ref 8.9–10.3)
Creatinine, Ser: 0.94 mg/dL (ref 0.44–1.00)
GFR calc Af Amer: >60 mL/min (ref >60)
GFR calc non Af Amer: 58 mL/min — ABNORMAL LOW (ref >60)
Glucose, Bld: 253 mg/dL — ABNORMAL HIGH (ref 70–99)
Potassium: 3.5 mmol/L (ref 3.5–5.1)
Sodium: 126 mmol/L — ABNORMAL LOW (ref 135–145)

## 2015-03-13 LAB — HEMOGLOBIN A1C
Hgb A1c MFr Bld: 8.2 % — ABNORMAL HIGH (ref 4.8–5.6)
MEAN PLASMA GLUCOSE: 189 mg/dL

## 2015-03-13 LAB — GLUCOSE, CAPILLARY
Glucose-Capillary: 263 mg/dL — ABNORMAL HIGH (ref 70–99)
Glucose-Capillary: 326 mg/dL — ABNORMAL HIGH (ref 70–99)
Glucose-Capillary: 278 mg/dL — ABNORMAL HIGH (ref 70–99)

## 2015-03-13 LAB — OSMOLALITY: Osmolality: 280 mOsm/kg (ref 275–300)

## 2015-03-13 LAB — SODIUM, URINE, RANDOM: SODIUM UR: 82 mmol/L

## 2015-03-13 LAB — OSMOLALITY, URINE: OSMOLALITY UR: 561 mosm/kg (ref 390–1090)

## 2015-03-13 LAB — TSH: TSH: 0.484 u[IU]/mL (ref 0.350–4.500)

## 2015-03-13 MED ORDER — RAMELTEON 8 MG PO TABS
8.0000 mg | ORAL_TABLET | Freq: Every day | ORAL | Status: DC
  Filled 2015-03-13 (×2): qty 1

## 2015-03-13 MED ORDER — INSULIN GLARGINE 100 UNIT/ML ~~LOC~~ SOLN
15.0000 [IU] | Freq: Every day | SUBCUTANEOUS | Status: DC
  Administered 2015-03-13: 15 [IU] via SUBCUTANEOUS
  Filled 2015-03-13 (×2): qty 0.15

## 2015-03-13 MED ORDER — INSULIN ASPART 100 UNIT/ML ~~LOC~~ SOLN
0.0000 [IU] | Freq: Three times a day (TID) | SUBCUTANEOUS | Status: DC
  Administered 2015-03-13 – 2015-03-14 (×2): 8 [IU] via SUBCUTANEOUS
  Administered 2015-03-14: 3 [IU] via SUBCUTANEOUS
  Administered 2015-03-14: 5 [IU] via SUBCUTANEOUS

## 2015-03-13 MED ORDER — INSULIN ASPART 100 UNIT/ML ~~LOC~~ SOLN
0.0000 [IU] | Freq: Every day | SUBCUTANEOUS | Status: DC
  Administered 2015-03-13: 3 [IU] via SUBCUTANEOUS

## 2015-03-13 MED ORDER — IPRATROPIUM-ALBUTEROL 0.5-2.5 (3) MG/3ML IN SOLN
3.0000 mL | Freq: Four times a day (QID) | RESPIRATORY_TRACT | Status: DC
  Administered 2015-03-13 – 2015-03-14 (×5): 3 mL via RESPIRATORY_TRACT
  Filled 2015-03-13 (×5): qty 3

## 2015-03-13 MED ORDER — INSULIN ASPART 100 UNIT/ML ~~LOC~~ SOLN
0.0000 [IU] | Freq: Three times a day (TID) | SUBCUTANEOUS | Status: DC
  Administered 2015-03-13: 9 [IU] via SUBCUTANEOUS

## 2015-03-13 MED ORDER — INSULIN ASPART 100 UNIT/ML ~~LOC~~ SOLN: 0.0000 [IU] | Freq: Every day | SUBCUTANEOUS | Status: DC

## 2015-03-13 NOTE — Progress Notes (Signed)
Pt satting 90% on 2L O2 via n.c.  When O2 removed, pt satting 85-86% on RA.  Pt recovers to 90% when O2 replaced at 2L o2 via n.c.

## 2015-03-13 NOTE — Progress Notes (Signed)
Rt protocol assessment done. Pt scored a 5. I modified the frequency of HHN treatments to QID accordingly. The order for Q2 hours PRN has NOT been modified. Please see assessment documentation.

## 2015-03-13 NOTE — Progress Notes (Signed)
Subjective: Doing well this AM. Says her breathing is about the same as yesterday, maybe somewhat improved. Decreased wheezing on exam.   Objective: Vital signs in last 24 hours: Filed Vitals:   03/12/15 1945 03/13/15 0556 03/13/15 0740 03/13/15 0840  BP: 150/39 126/48  124/41  Pulse: 59 59  70  Temp: 97.7 F (36.5 C) 97.7 F (36.5 C)  97.3 F (36.3 C)  TempSrc: Oral Oral  Oral  Resp: 18   18  Height:      Weight:      SpO2: 89% 92% 89% 97%   Weight change:   Intake/Output Summary (Last 24 hours) at 03/13/15 1150 Last data filed at 03/13/15 0800  Gross per 24 hour  Intake    240 ml  Output    400 ml  Net   -160 ml   Physical Exam: General: Elderly white female, alert, cooperative, NAD. HEENT: PERRL, EOMI. Moist mucus membranes Neck: Full range of motion without pain, supple, no lymphadenopathy or carotid bruits Lungs: Air entry equal bilaterally, faint scattered rhonchi and wheezes, improved from yesterday.  Heart: RRR, no murmurs, gallops, or rubs Abdomen: Soft, non-tender, non-distended, BS + Extremities: No cyanosis, clubbing, or edema Neurologic: Alert & oriented x3, cranial nerves II-XII intact, strength grossly intact, sensation intact to light touch    Lab Results: Basic Metabolic Panel:  Recent Labs Lab 03/12/15 1301 03/13/15 0527  NA 128* 126*  K 3.7 3.5  CL 90* 89*  CO2 26 25  GLUCOSE 207* 253*  BUN 15 16  CREATININE 0.96 0.94  CALCIUM 9.0 8.9   CBC:  Recent Labs Lab 03/08/15 2207 03/12/15 1301  WBC 4.1* 6.6  NEUTROABS  --  5.2  HGB 12.3 12.1  HCT 37.1* 35.7*  MCV 88.0 85.2  PLT  --  179   CBG:  Recent Labs Lab 03/12/15 1821 03/12/15 2130 03/13/15 0740 03/13/15 1109  GLUCAP 228* 295* 263* 326*    Studies/Results: Dg Chest 2 View  03/12/2015   CLINICAL DATA:  Weakness.  Productive cough.  EXAM: CHEST  2 VIEW  COMPARISON:  None.  FINDINGS: Normal sized heart. Clear lungs. Minimal diffuse peribronchial thickening and  accentuation of the interstitial markings. Mild scoliosis. Upper lumbar spine degenerative changes. Cholecystectomy clips.  IMPRESSION: Minimal bronchitic changes.   Electronically Signed   By: Claudie Revering M.D.   On: 03/12/2015 13:29   Medications: I have reviewed the patient's current medications. Scheduled Meds: . atenolol  50 mg Oral Daily  . enoxaparin (LOVENOX) injection  40 mg Subcutaneous Q24H  . insulin aspart  0-5 Units Subcutaneous QHS  . insulin aspart  0-9 Units Subcutaneous TID WC  . insulin glargine  15 Units Subcutaneous Daily  . ipratropium-albuterol  3 mL Nebulization QID  . predniSONE  40 mg Oral Q breakfast  . simvastatin  20 mg Oral q1800   Continuous Infusions:  PRN Meds:.albuterol   Assessment/Plan: 74 y/o F w/ PMHx of HTN, DM type II, COPD, and extensive smoking history, admitted for COPD exacerbation.   COPD Exacerbation: Breathing somewhat improved, lung exam better than yesterday. Decreased wheezing bilaterally. -Continue Prednisone 40 mg daily (end date 03/16/15) -Ambulate w/ pulse oximetry -Duonebs q4h + Albuterol neb q2h prn for SOB -Start Spiriva qd on discharge  -Supplemental O2 prn; SpO2 >92%. No evidence of chronic CO2 retention -Recommend outpatient low dose CT scan of the chest given age and 1 pack/year smoking history  -Outpatient PFT's; has not had in the past  Hyponatremia: Na 128 on admission, decreased to 126 this AM. No previous labs to suggest hyponatremia in the past. Patient does admit to recent fatigue and weakness. Chlorthalidone has been shown to cause low Na in <1% of patients. Patient appears euvolemic, no previous h/o liver disease, Cr wnl. Some concern of SIADH in the setting of Lung CA given her 66 pack/year smoking history, however, this is relatively unlikely. Urine Osm 561, Urine Na 82. Most likely related to diuretic use at this time.  -Hold Chlorthalidone -Repeat BMP in AM -Serum Osm pending  DM type II: HbA1c 8.2. CBG's  trended as follows:   Recent Labs Lab 03/12/15 1821 03/12/15 2130 03/13/15 0740 03/13/15 1109  GLUCAP 228* 295* 263* 326*  -Start Lantus 15 units daily -Change to ISS-M + HS coverge -Appreciate DM coordinator assistance  HTN: BP stable.  -Hold Chlorthalidone -Continue Atenolol  HLD: Stable.  -Continue Zocor  DVT/PE PPx: Lovenox   Dispo: Disposition is deferred at this time, awaiting improvement of current medical problems.  Anticipated discharge in approximately 1 day(s).   The patient does not have a current PCP (Provider Not In System) and does need an [facility] hospital follow-up appointment after discharge.  The patient does not have transportation limitations that hinder transportation to clinic appointments.  .Services Needed at time of discharge: Y = Yes, Blank = No PT:   OT:   RN:   Equipment:   Other:     LOS: 1 day   Corky Sox, MD 03/13/2015, 11:50 AM

## 2015-03-13 NOTE — Progress Notes (Signed)
Inpatient Progress Note - Internal Medicine  Subjective: Today the pt is resting comfortably and has no acute complaints. She feels stable from yesterday. She denies any change in her SOB; she does not feel SOB in the bed and has been unable to assess DOE because she has been in bed since coming up to her room yesterday. She denies cough, chest pain, abdominal pain.   Objective: Vital signs in last 24 hours: Filed Vitals:   03/12/15 1945 03/13/15 0556 03/13/15 0740 03/13/15 0840  BP: 150/39 126/48  124/41  Pulse: 59 59  70  Temp: 97.7 F (36.5 C) 97.7 F (36.5 C)  97.3 F (36.3 C)  TempSrc: Oral Oral  Oral  Resp: 18   18  Height:      Weight:      SpO2: 89% 92% 89% 97%   Weight change:   Intake/Output Summary (Last 24 hours) at 03/13/15 1129 Last data filed at 03/13/15 0800  Gross per 24 hour  Intake    240 ml  Output    400 ml  Net   -160 ml   BP 124/41 mmHg  Pulse 70  Temp(Src) 97.3 F (36.3 C) (Oral)  Resp 18  Ht 5\' 2"  (1.575 m)  Wt 145 lb (65.772 kg)  BMI 26.51 kg/m2  SpO2 97%  General Appearance:    Alert, cooperative, no distress, appears stated age  Lungs:     Scattered rhonchi, mildly distant breath sounds bilaterally.  Normal WOB.   Heart:    Regular rate and rhythm, S1 and S2 normal, no murmur, rub   or gallop  Abdomen:     Soft, non-tender, non-distended, +BS  Extremities:   Extremities normal, atraumatic, no cyanosis or edema   Lab Results: Basic Metabolic Panel:  Recent Labs Lab 03/12/15 1301 03/13/15 0527  NA 128* 126*  K 3.7 3.5  CL 90* 89*  CO2 26 25  GLUCOSE 207* 253*  BUN 15 16  CREATININE 0.96 0.94  CALCIUM 9.0 8.9   CBG:  Recent Labs Lab 03/12/15 1821 03/12/15 2130 03/13/15 0740 03/13/15 1109  GLUCAP 228* 295* 263* 326*   Studies/Results: Dg Chest 2 View  03/12/2015   CLINICAL DATA:  Weakness.  Productive cough.  EXAM: CHEST  2 VIEW  COMPARISON:  None.  FINDINGS: Normal sized heart. Clear lungs. Minimal diffuse peribronchial  thickening and accentuation of the interstitial markings. Mild scoliosis. Upper lumbar spine degenerative changes. Cholecystectomy clips.  IMPRESSION: Minimal bronchitic changes.   Electronically Signed   By: Claudie Revering M.D.   On: 03/12/2015 13:29   Medications: I have reviewed the patient's current medications. Scheduled Meds: . atenolol  50 mg Oral Daily  . enoxaparin (LOVENOX) injection  40 mg Subcutaneous Q24H  . insulin aspart  0-5 Units Subcutaneous QHS  . insulin aspart  0-9 Units Subcutaneous TID WC  . insulin glargine  15 Units Subcutaneous Daily  . ipratropium-albuterol  3 mL Nebulization QID  . predniSONE  40 mg Oral Q breakfast  . simvastatin  20 mg Oral q1800   Continuous Infusions:  PRN Meds:.albuterol Assessment/Plan: Active Problems:   COPD exacerbation  COPD Exacerbation: Pt has 60 pack-year smoking history and approximately 1 similar episode per year. She has never had PFTs. She is s/p 5 days of daily 500 mg levofloxacin and 20 mg prednisone with improvement in her cough but no change in her shortness of breath. CXR demonstrates minimal bronchitic changes, no effusion/mass/infiltrate. - s/p Methylprednisolone 125 mg x 1,  -  prednisone burst 40 mg x 5 days - Duonebs q4hrs - Albuterol rescue inhaler q2hrs PRN - O2 via [place] for SpO2 >92% - ambulate w/o O2 today, check ambulatory SpO2 - Pt has no local primary care - establish primary care for: PFTs, LDCT yearly, +/- pulmonary rehab - At d/c, start Spiriva inhaler daily, continue Albuterol rescue inhaler PRN  Type II Diabetes Mellitus: Pt previously taking Novolog 70/30, 30-40 units BID with meals. Per pt's son, pt does not have good understanding of how much insulin to take and has had episodes of confusion and tremor after insulin injection. - Hgb A1c pending - 15 units insulin glargine daily, + SSI-R - Diabetes educator c/s for teaching - CC diet  Hyponatremia: Pt presented with serum sodium of 128. Clinically  euvolemic. Urine sodium inappropriately high at 82, urine osmolality 561. DDx includes medication effect, SIADH/reset osmostat, hypothyroidism, adrenal insufficiency. - Hold home chlorthalidone, recheck BMP this evening - TSH to r/o hypothyroidism  HTN: Home meds = Tenoretic 50-25 mg daily - Continue atenolol 50 mg daily, hold chlorthalidone for hyponatremia  HLD: Continue home simvastatin 20 mg daily  PPX: lovenox for DVT PPX  Dispo: Disposition is deferred at this time, awaiting improvement of current medical problems.  Anticipated discharge in approximately 1 day.   The patient does not have a current PCP (Provider Not In System) and does need an [facility] hospital follow-up appointment after discharge.  The patient does not have transportation limitations that hinder transportation to clinic appointments.  .Services Needed at time of discharge: Y = Yes, Blank = No PT:   OT:   RN:   Equipment:   Other:     LOS: 1 day   Susa Day, Med Student 03/13/2015, 11:29 AM

## 2015-03-13 NOTE — Progress Notes (Signed)
Inpatient Diabetes Program Recommendations  AACE/ADA: New Consensus Statement on Inpatient Glycemic Control (2013)  Target Ranges:  Prepandial:   less than 140 mg/dL      Peak postprandial:   less than 180 mg/dL (1-2 hours)      Critically ill patients:  140 - 180 mg/dL     Results for SHARAE, ZAPPULLA (MRN [id number]) as of 03/13/2015 12:02  Ref. Range 03/13/2015 07:40 03/13/2015 11:09  Glucose-Capillary Latest Ref Range: 70-99 mg/dL 263 (H) 326 (H)     Admit with: COPD Flare  History: DM, HTN  Home DM Meds: 70/30 insulin- 40 units AM/ 30 units PM  Current DM Orders: Lantus 15 units daily (started today)            Novolog Moderate SSI tid ac + HS (increased today)     **Per MD notes, patient has been having issues with Hypoglycemia at home.  Patient and son have many questions about 70/30 insulin (how to take, when to take, Hypoglycemia, etc).  **Will speak with patient and son today to answer questions.  **Note Lantus 15 units daily started today.  Note MD plans to assess patient's insulin requirements in an attempt to make sure patient is on a appropriate dose of insulin at home.  **I assume patient has Medicare coverage even though no insurance is listed in [facility] at this time.    Will follow Wyn Quaker RN, MSN, CDE Diabetes Coordinator Inpatient Diabetes Program Team Pager: [phone] (8a-5p)

## 2015-03-13 NOTE — Progress Notes (Signed)
Inpatient Diabetes Program Recommendations  AACE/ADA: New Consensus Statement on Inpatient Glycemic Control (2013)  Target Ranges:  Prepandial:   less than 140 mg/dL      Peak postprandial:   less than 180 mg/dL (1-2 hours)      Critically ill patients:  140 - 180 mg/dL    Results for Stefanie Vincent, Stefanie Vincent (MRN [id number]) as of 03/13/2015 15:13  Ref. Range 03/13/2015 07:40 03/13/2015 11:09  Glucose-Capillary Latest Ref Range: 70-99 mg/dL 263 (H) 326 (H)     Results for Stefanie Vincent, Stefanie Vincent (MRN [id number]) as of 03/13/2015 15:13  Ref. Range 03/12/2015 18:26  Hemoglobin A1C Latest Ref Range: 4.8-5.6 % 8.2 (H)     Admit with: COPD Flare  History: DM, HTN  Home DM Meds: 70/30 insulin- 40 units AM/ 30 units PM  Current DM Orders: Lantus 15 units daily (started today)  Novolog Moderate SSI tid ac + HS (increased today)    **Per MD notes, patient has been having issues with Hypoglycemia at home. Patient and son have many questions about 70/30 insulin (how to take, when to take, Hypoglycemia, etc).  **Note Lantus 15 units daily started today. Note MD plans to assess patient's insulin requirements in an attempt to make sure patient is on a appropriate dose of insulin at home.    Spoke with patient this afternoon about her insulin regimen at home.  Patient told me she was diagnosed with DM "a long time ago" and that she initially started on Metformin to control her CBGs.  Patient told me she was shocked when she found out she had DM b/c no one in her family ever had a diagnosis of DM.  Patient went on to tell me that she has been taking 70/30 insulin for a long time (could not remember exactly how many years) and that her doctor at home adjusted her 70/30 insulin to their current doses about 1 year ago when she was hospitalized. (Stefanie Vincent with [facility]).  Attempted to speak with Stefanie Vincent office to confirm patient's current insulin doses,  however, I could not reach any one today.  Stefanie Vincent- [phone].  MD- Patient did state she was concerned about the cost of medicines with her [employer] and would like to stay on 70/30 insulin at home if possible.  Explained to patient that the team may want to assess her insulin needs to make sure she goes home on the right amount of insulin.  Reviewed Hypoglycemia signs and symptoms with patient and appropriate treatment.  Patient told me she usually eats a [place] cookie when she feels shaky.  Discussed with patient the appropriate treatment options (fruit juice, regular soda, glucose tablets, jelly or honey) and encouraged patient to keep a record of all her blood sugars at home.  Discussed normal blood sugar goals for home and also reviewed the importance of rotation of her insulin injection sites when giving insulin at home.  Patient stated she just recently started rotating her sites and was glad that I reminded her to rotate during this admission.  Gave patient two educational pamphlets to use at home and to share with her family (one handout on Hypoglycemia- Signs/Symptoms and Treatment and one handout on insulin injection, blood sugar goals, etc).  Also encouraged patient to get established with a PCP here in [place] if she is planning to stay here permanently.  Patient is currently living with her daughter in [place].  Have placed a care management consult so  that patient and her daughter can get some assistance finding a PCP in the area.  Also wrote down the names of several local Endocrinology practice here in [place] as well (Stefanie Vincent with [facility], [facility]ie [facility] with [facility], and Stefanie Vincent with [facility]).    Will follow Stefanie Quaker RN, MSN, CDE Diabetes Coordinator Inpatient Diabetes Program Team Pager: [phone] (8a-5p)

## 2015-03-14 LAB — BASIC METABOLIC PANEL
ANION GAP: 12 (ref 5–15)
BUN: 21 mg/dL — AB (ref 6–20)
CHLORIDE: 91 mmol/L — AB (ref 101–111)
CO2: 26 mmol/L (ref 22–32)
Calcium: 9.4 mg/dL (ref 8.9–10.3)
Creatinine, Ser: 0.92 mg/dL (ref 0.44–1.00)
GFR calc Af Amer: >60 mL/min (ref >60)
GFR calc non Af Amer: 59 mL/min — ABNORMAL LOW (ref >60)
GLUCOSE: 210 mg/dL — AB (ref 70–99)
Potassium: 3.3 mmol/L — ABNORMAL LOW (ref 3.5–5.1)
Sodium: 129 mmol/L — ABNORMAL LOW (ref 135–145)

## 2015-03-14 LAB — GLUCOSE, CAPILLARY
Glucose-Capillary: 203 mg/dL — ABNORMAL HIGH (ref 70–99)
Glucose-Capillary: 191 mg/dL — ABNORMAL HIGH (ref 70–99)
Glucose-Capillary: 255 mg/dL — ABNORMAL HIGH (ref 70–99)

## 2015-03-14 LAB — MAGNESIUM: Magnesium: 1.4 mg/dL — ABNORMAL LOW (ref 1.7–2.4)

## 2015-03-14 MED ORDER — TIOTROPIUM BROMIDE MONOHYDRATE 18 MCG IN CAPS: 18.0000 ug | ORAL_CAPSULE | Freq: Every day | RESPIRATORY_TRACT | Status: AC

## 2015-03-14 MED ORDER — PREDNISONE 20 MG PO TABS: 40.0000 mg | ORAL_TABLET | Freq: Every day | ORAL | Status: DC

## 2015-03-14 MED ORDER — LOSARTAN POTASSIUM 25 MG PO TABS: 25.0000 mg | ORAL_TABLET | Freq: Every day | ORAL | Status: DC

## 2015-03-14 MED ORDER — POTASSIUM CHLORIDE CRYS ER 20 MEQ PO TBCR
40.0000 meq | EXTENDED_RELEASE_TABLET | Freq: Once | ORAL | Status: AC
Start: 2015-03-14 — End: 2015-03-14
  Administered 2015-03-14: 40 meq via ORAL
  Filled 2015-03-14: qty 2

## 2015-03-14 MED ORDER — TIOTROPIUM BROMIDE MONOHYDRATE 18 MCG IN CAPS
18.0000 ug | ORAL_CAPSULE | Freq: Every day | RESPIRATORY_TRACT | Status: DC
  Filled 2015-03-14: qty 5

## 2015-03-14 MED ORDER — ATENOLOL 50 MG PO TABS: 50.0000 mg | ORAL_TABLET | Freq: Every day | ORAL | Status: DC

## 2015-03-14 MED ORDER — LOSARTAN POTASSIUM 25 MG PO TABS
25.0000 mg | ORAL_TABLET | Freq: Every day | ORAL | Status: DC
  Administered 2015-03-14: 25 mg via ORAL
  Filled 2015-03-14: qty 1

## 2015-03-14 MED ORDER — MAGNESIUM SULFATE 2 GM/50ML IV SOLN
2.0000 g | Freq: Once | INTRAVENOUS | Status: AC
  Administered 2015-03-14: 2 g via INTRAVENOUS
  Filled 2015-03-14: qty 50

## 2015-03-14 MED ORDER — INSULIN ASPART PROT & ASPART (70-30 MIX) 100 UNIT/ML ~~LOC~~ SUSP: 10.0000 [IU] | Freq: Two times a day (BID) | SUBCUTANEOUS | Status: AC

## 2015-03-14 MED ORDER — INSULIN ASPART PROT & ASPART (70-30 MIX) 100 UNIT/ML ~~LOC~~ SUSP
10.0000 [IU] | Freq: Two times a day (BID) | SUBCUTANEOUS | Status: DC
  Administered 2015-03-14 (×2): 10 [IU] via SUBCUTANEOUS
  Filled 2015-03-14: qty 10

## 2015-03-14 NOTE — Evaluation (Signed)
Physical Therapy Evaluation Patient Details Name: Stefanie Vincent MRN: [id number] DOB: 1941-01-06 Today's Date: 03/14/2015   History of Present Illness  74 y/o F w/ PMHx of HTN, DM type II, COPD, and extensive smoking history, admitted for COPD exacerbation.   Clinical Impression  Patient evaluated by Physical Therapy with no further acute PT needs identified. All education has been completed and the patient has no further questions.  PT is signing off. Thank you for this referral.  Session conducted on Room Air and O2 sats remained greater than or equal to 93%; much of the session she was close to 100%      Follow Up Recommendations No PT follow up    Equipment Recommendations  None recommended by PT    Recommendations for Other Services       Precautions / Restrictions        Mobility  Bed Mobility Overal bed mobility: Modified Independent                Transfers Overall transfer level: Modified independent Equipment used: None (and pushing Dinamap)                Ambulation/Gait Ambulation/Gait assistance: Modified independent (Device/Increase time) Ambulation Distance (Feet): 300 Feet (greater tahn) Assistive device: None (and pushing Dinamap) Gait Pattern/deviations: WFL(Within Functional Limits)     General Gait Details: Cues to self-monitor for activity tolerance  Stairs            Wheelchair Mobility    Modified Rankin (Stroke Patients Only)       Balance Overall balance assessment: No apparent balance deficits (not formally assessed)                                           Pertinent Vitals/Pain Pain Assessment: No/denies pain    Home Living Family/patient expects to be discharged to:: Private residence Living Arrangements: Children Available Help at Discharge: Family;Available PRN/intermittently Type of Home: House (Daughter's home) Home Access: Level entry     Home Layout: One level         Prior Function Level of Independence: Independent         Comments: did report 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     Hand Dominance        Extremity/Trunk Assessment   Upper Extremity Assessment: Overall WFL for tasks assessed           Lower Extremity Assessment: Overall WFL for tasks assessed         Communication   Communication: No difficulties  Cognition Arousal/Alertness: Awake/alert Behavior During Therapy: WFL for tasks assessed/performed Overall Cognitive Status: Within Functional Limits for tasks assessed                      General Comments General comments (skin integrity, edema, etc.): Session conducted on Room Air and O2 sats remained greater than or equal to 93%; much of the session she was close to 100%    Exercises        Assessment/Plan    PT Assessment Patent does not need any further PT services  PT Diagnosis Generalized weakness   PT Problem List    PT Treatment Interventions     PT Goals (Current goals can be found in the Care Plan section) Acute Rehab PT Goals Patient Stated Goal: Hopes to  get home today PT Goal Formulation: All assessment and education complete, DC therapy    Frequency     Barriers to discharge        Co-evaluation               End of Session   Activity Tolerance: Patient tolerated treatment well Patient left: in chair;with call bell/phone within reach Nurse Communication: Mobility status         Time: 0263-7858 PT Time Calculation (min) (ACUTE ONLY): 20 min   Charges:   PT Evaluation $Initial PT Evaluation Tier I: 1 Procedure     PT G CodesRoney Marion Hamff 03/14/2015, 4:04 PM  Roney Marion, PT  Acute Rehabilitation Services Pager [phone] Office [phone]

## 2015-03-14 NOTE — Progress Notes (Signed)
Subjective: Frustrated this AM. Breathing more or less unchanged. Decreased SpO2 into the 80's off oxygen yesterday.   Objective: Vital signs in last 24 hours: Filed Vitals:   03/13/15 1658 03/13/15 2028 03/13/15 2113 03/14/15 0552  BP:  148/48  163/78  Pulse:  61  61  Temp:  97.6 F (36.4 C)  97.4 F (36.3 C)  TempSrc:  Oral  Oral  Resp:  16  18  Height:  5\' 2"  (1.575 m)    Weight:  142 lb 10.2 oz (64.7 kg)    SpO2: 92% 93% 94% 97%   Weight change: -2 lb 5.8 oz (-1.072 kg)  Intake/Output Summary (Last 24 hours) at 03/14/15 0751 Last data filed at 03/13/15 1747  Gross per 24 hour  Intake    720 ml  Output      0 ml  Net    720 ml   Physical Exam: General: Elderly white female, alert, cooperative, NAD. HEENT: PERRL, EOMI. Moist mucus membranes Neck: Full range of motion without pain, supple, no lymphadenopathy or carotid bruits Lungs: Air entry equal bilaterally, faint scattered rhonchi and wheezes, improved from yesterday.  Heart: RRR, no murmurs, gallops, or rubs Abdomen: Soft, non-tender, non-distended, BS + Extremities: No cyanosis, clubbing, or edema Neurologic: Alert & oriented x3, cranial nerves II-XII intact, strength grossly intact, sensation intact to light touch    Lab Results: Basic Metabolic Panel:  Recent Labs Lab 03/13/15 0527 03/14/15 0558  NA 126* 129*  K 3.5 3.3*  CL 89* 91*  CO2 25 26  GLUCOSE 253* 210*  BUN 16 21*  CREATININE 0.94 0.92  CALCIUM 8.9 9.4  MG  --  1.4*   CBC:  Recent Labs Lab 03/08/15 2207 03/12/15 1301  WBC 4.1* 6.6  NEUTROABS  --  5.2  HGB 12.3 12.1  HCT 37.1* 35.7*  MCV 88.0 85.2  PLT  --  179   CBG:  Recent Labs Lab 03/12/15 1821 03/12/15 2130 03/13/15 0740 03/13/15 1109 03/13/15 2215  GLUCAP 228* 295* 263* 326* 278*    Studies/Results: Dg Chest 2 View  03/12/2015   CLINICAL DATA:  Weakness.  Productive cough.  EXAM: CHEST  2 VIEW  COMPARISON:  None.  FINDINGS: Normal sized heart. Clear lungs.  Minimal diffuse peribronchial thickening and accentuation of the interstitial markings. Mild scoliosis. Upper lumbar spine degenerative changes. Cholecystectomy clips.  IMPRESSION: Minimal bronchitic changes.   Electronically Signed   By: Claudie Revering M.D.   On: 03/12/2015 13:29   Medications: I have reviewed the patient's current medications. Scheduled Meds: . atenolol  50 mg Oral Daily  . enoxaparin (LOVENOX) injection  40 mg Subcutaneous Q24H  . insulin aspart  0-15 Units Subcutaneous TID WC  . insulin aspart  0-5 Units Subcutaneous QHS  . insulin aspart protamine- aspart  10 Units Subcutaneous BID WC  . ipratropium-albuterol  3 mL Nebulization QID  . predniSONE  40 mg Oral Q breakfast  . ramelteon  8 mg Oral QHS  . simvastatin  20 mg Oral q1800   Continuous Infusions:  PRN Meds:.albuterol   Assessment/Plan: 74 y/o F w/ PMHx of HTN, DM type II, COPD, and extensive smoking history, admitted for COPD exacerbation.   COPD Exacerbation: Taken off O2 yesterday, SpO2 decreased to 85-86% on room air.  -Continue Prednisone 40 mg daily (end date 03/16/15) -Ambulate w/ pulse oximetry today. Patient may require home O2.  -Duonebs q4h + Albuterol neb q2h prn for SOB -Start Spiriva qd on discharge  -  Supplemental O2 prn; SpO2 >92%. No evidence of chronic CO2 retention -Recommend outpatient low dose CT scan of the chest given age and 32 pack/year smoking history  -Outpatient PFT's; has not had in the past  Euvolemic Isotonic Hyponatremia: Held Chlorthalidone, Na increased to 129 this AM. Urine osm 500's, urine sodium 80's, serum osm 280. Most likely related to diuretics.  -Repeat BMP in outpatient clinic  DM type II: HbA1c 8.2. Patient expressed interest in continuing Insulin 70/30 given cost. CBG's trended as follows:   Recent Labs Lab 03/12/15 1821 03/12/15 2130 03/13/15 0740 03/13/15 1109 03/13/15 2215  GLUCAP 228* 295* 263* 326* 278*  -Change insulin to 70/30 10 mg bid -ISS-M + HS  coverge -Appreciate DM coordinator assistance  HTN: BP increased, 180/58 this AM.  -Continue Atenolol 50 mg  -Add Losartan 25 mg daily (given h/o DM type II)  HLD: Stable.  -Continue Zocor  DVT/PE PPx: Lovenox [place]  Dispo: Disposition is deferred at this time, awaiting improvement of current medical problems.  Anticipated discharge in approximately 1 day(s).   The patient does not have a current PCP (Provider Not In System) and does need an [facility] hospital follow-up appointment after discharge.  The patient does not have transportation limitations that hinder transportation to clinic appointments.  .Services Needed at time of discharge: Y = Yes, Blank = No PT:   OT:   RN:   Equipment:   Other:     LOS: 2 days   Corky Sox, MD 03/14/2015, 7:51 AM

## 2015-03-14 NOTE — Discharge Summary (Signed)
Name: Stefanie Vincent MRN: [id number] DOB: 10/14/1941 74 y.o. PCP: Provider Not In System  Date of Admission: 03/12/2015 12:22 PM Date of Discharge: 03/14/2015 Attending Physician: Madilyn Fireman, MD  Discharge Diagnosis: 1. COPD Exacerbation 2. Hyponatremia 3. DM type II 4. HTN  Discharge Medications:   Medication List    STOP taking these medications        atenolol-chlorthalidone 50-25 MG per tablet  Commonly known as:  TENORETIC     benzonatate 200 MG capsule  Commonly known as:  TESSALON     levofloxacin 500 MG tablet  Commonly known as:  LEVAQUIN     loperamide 2 MG tablet  Commonly known as:  IMODIUM A-D     Potassium 99 MG Tabs      TAKE these medications        albuterol 108 (90 BASE) MCG/ACT inhaler  Commonly known as:  PROVENTIL HFA;VENTOLIN HFA  Inhale 2 puffs into the lungs every 6 (six) hours as needed for wheezing or shortness of breath (cough, shortness of breath or wheezing.).     atenolol 50 MG tablet  Commonly known as:  TENORMIN  Take 1 tablet (50 mg total) by mouth daily.     insulin aspart protamine- aspart (70-30) 100 UNIT/ML injection  Commonly known as:  NOVOLOG MIX 70/30  Inject 0.1 mLs (10 Units total) into the skin 2 (two) times daily with a meal.     loratadine 10 MG tablet  Commonly known as:  CLARITIN  Take 10 mg by mouth daily as needed for allergies.     losartan 25 MG tablet  Commonly known as:  COZAAR  Take 1 tablet (25 mg total) by mouth daily.     predniSONE 20 MG tablet  Commonly known as:  DELTASONE  Take 2 tablets (40 mg total) by mouth daily with breakfast.     rOPINIRole 0.5 MG tablet  Commonly known as:  REQUIP  Take 0.5 mg by mouth at bedtime as needed (FOR RESTLESS LEGS).     simvastatin 20 MG tablet  Commonly known as:  ZOCOR  Take 20 mg by mouth daily.     tiotropium 18 MCG inhalation capsule  Commonly known as:  SPIRIVA  Place 1 capsule (18 mcg total) into inhaler and inhale daily.     vitamin D  (CHOLECALCIFEROL) 400 UNITS tablet  Take 400 Units by mouth daily.        Disposition and follow-up:   Stefanie Vincent was [facility] in Good condition.  At the hospital follow up visit please address:  1.  COPD; Patient needs PFT's and low dose CT scan of the chest given her extensive smoking history. How is her breathing? SOB while ambulating? Cough?   Hyponatremia; Most likely related to chlorthalidone use. Need to recheck BMP  2.  Labs / imaging needed at time of follow-up: BMP, low dose CT chest, PFT's  3.  Pending labs/ test needing follow-up: None  Follow-up Appointments:     Follow-up Information    Follow up with Clinton Gallant, MD On 03/22/2015.   Specialty:  Internal Medicine   Why:  3:15 PM   Contact information:   [place] [phone]       Discharge Instructions: Discharge Instructions    Discharge instructions    Complete by:  As directed   Please follow up with the Internal Medicine Clinic. Ground Floor [place] [phone] in 1 week  Take care  Please pick up prescriptions from [facility]     Increase activity slowly    Complete by:  As directed            Procedures Performed:  Dg Chest 2 View  03/12/2015   CLINICAL DATA:  Weakness.  Productive cough.  EXAM: CHEST  2 VIEW  COMPARISON:  None.  FINDINGS: Normal sized heart. Clear lungs. Minimal diffuse peribronchial thickening and accentuation of the interstitial markings. Mild scoliosis. Upper lumbar spine degenerative changes. Cholecystectomy clips.  IMPRESSION: Minimal bronchitic changes.   Electronically Signed   By: Claudie Revering M.D.   On: 03/12/2015 13:29   Dg Chest 2 View  03/08/2015   CLINICAL DATA:  Pneumonia  EXAM: CHEST  2 VIEW  COMPARISON:  None.  FINDINGS: The heart size and mediastinal contours are within normal limits. Both lungs are clear. The visualized skeletal structures are unremarkable.  IMPRESSION: No active  cardiopulmonary disease.   Electronically Signed   By: Andreas Newport M.D.   On: 03/08/2015 22:11    Admission HPI: Stefanie Vincent is a 74 y.o. female with HTN, T2DM, and a 60 pack-year smoking hx who presented to [facility] ED on 03/12/2015 with an 8-day history of SOB and cough. The pt reported DOE as well as baseline SOB. She reported fever for the first 1-2 days of her illness only. She was seen 4 days prior to admission in Urgent Care, where she was sent out with prednisone, levofloxacin, and albuterol inhaler for presumed COPD exacerbation. Her cough was initially productive of thick sputum but since being seen at Urgent Care she denied productive cough. She reporte her overall SOB had been stable over the 8 days prior to admission. She endorsed sick contacts (grandson) with similar illness. Per the patient's son, the pt was also having difficulty with correct administration of her insulin lately. He reported that at times she would intend to take 3 units and will inject 30, and that on multiple occasions she became confused and tremulous after administration of insulin. The pt reported one episode of passing out recently; unclear if this was associated with insulin administration. The patient was admitted to the med-surg service on 03/12/2015 for management of COPD exacerbation. The patient received methylprednisolone followed by initiation of prednisone burst, as well as DuoNebs and albuterol nebulizer as needed. She received oxygen by nasal cannula for maintenance of adequate oxygen saturation. She also received diabetes education for insulin administration. On the day of discharge, the patient was breathing comfortably with no cough and had no acute complaints. She was able to ambulate well with adequate oxygen saturation and no need for supplemental oxygen. She was discharged to home on 03/14/2015.  No procedures were performed during the course of this hospitalization.  Hospital Course by problem  list:  1. COPD Exacerbation- The patient received 125 mg methylprednisolone, after which she was started on 5 day course of prednisone. She received DuoNebs and albuterol nebulizers as needed while admitted. She will be discharged with Spiriva, Albuterol rescue inhaler, as well as the remainder of her prednisone course. On the day of discharge, she ambulated without oxygen and demonstrated no need for supplemental oxygen.   2. Hyponatremia- On admission the patient was found to be hyponatremic but asymptomatic from this perspective. The patient's home chlorthalidone was discontinued, with subsequent improvement of her sodium concentration.   3. DM type II- The patient received diabetes education during this hospitalization, and her dose of 70/30 insulin was  decreased to 10 units twice a day. She will follow up as an outpatient for further management of her diabetes regimen.  4. HTN- The patient's home chlorthalidone was discontinued as this was likely causing her hyponatremia. Her home atenolol was continued. Losartan was added for blood pressure control given her diabetes.    Discharge Vitals:   BP 180/58 mmHg  Pulse 51  Temp(Src) 98.1 F (36.7 C) (Oral)  Resp 18  Ht 5\' 2"  (1.575 m)  Wt 142 lb 10.2 oz (64.7 kg)  BMI 26.08 kg/m2  SpO2 98%  Discharge Labs:  Results for orders placed or performed during the hospital encounter of 03/12/15 (from the past 24 hour(s))  Glucose, capillary     Status: Abnormal   Collection Time: 03/13/15 10:15 PM  Result Value Ref Range   Glucose-Capillary 278 (H) 70 - 99 mg/dL  Basic metabolic panel     Status: Abnormal   Collection Time: 03/14/15  5:58 AM  Result Value Ref Range   Sodium 129 (L) 135 - 145 mmol/L   Potassium 3.3 (L) 3.5 - 5.1 mmol/L   Chloride 91 (L) 101 - 111 mmol/L   CO2 26 22 - 32 mmol/L   Glucose, Bld 210 (H) 70 - 99 mg/dL   BUN 21 (H) 6 - 20 mg/dL   Creatinine, Ser 0.92 0.44 - 1.00 mg/dL   Calcium 9.4 8.9 - 10.3 mg/dL   GFR calc  non Af Amer 59 (L) >60 mL/min   GFR calc Af Amer >60 >60 mL/min   Anion gap 12 5 - 15  Magnesium     Status: Abnormal   Collection Time: 03/14/15  5:58 AM  Result Value Ref Range   Magnesium 1.4 (L) 1.7 - 2.4 mg/dL  Glucose, capillary     Status: Abnormal   Collection Time: 03/14/15  7:51 AM  Result Value Ref Range   Glucose-Capillary 203 (H) 70 - 99 mg/dL  Glucose, capillary     Status: Abnormal   Collection Time: 03/14/15 11:43 AM  Result Value Ref Range   Glucose-Capillary 191 (H) 70 - 99 mg/dL  Glucose, capillary     Status: Abnormal   Collection Time: 03/14/15  4:15 PM  Result Value Ref Range   Glucose-Capillary 255 (H) 70 - 99 mg/dL    Signed: Corky Sox, MD 03/14/2015, 4:58 PM    Services Ordered on Discharge: none Equipment Ordered on Discharge: none

## 2015-03-14 NOTE — Discharge Instructions (Signed)
1. You have a follow up appointment as follows:  Stefanie Vincent  On 03/22/2015 3:15 PM  [address] [place] [address] [phone]  2. Please take all medications as previously prescribed with the following changes:  Start using Spiriva (inhaler) one time daily  Use Albuterol when you have shortness of breath.   STOP taking Atenolol-Chlorthalidone. You will now take Atenolol 50 mg daily + Losartan 25 mg daily (2 different pills).   Use Insulin 70/30, 10 units twice daily.   3. If you have worsening of your symptoms or new symptoms arise, please call the clinic ([phone]), or go to the ER immediately if symptoms are severe.      Chronic Obstructive Pulmonary Disease Chronic obstructive pulmonary disease (COPD) is a common lung condition in which airflow from the lungs is limited. COPD is a general term that can be used to describe many different lung problems that limit airflow, including both chronic bronchitis and emphysema. If you have COPD, your lung function will probably never return to normal, but there are measures you can take to improve lung function and make yourself feel better.  CAUSES   Smoking (common).   Exposure to secondhand smoke.   Genetic problems.  Chronic inflammatory lung diseases or recurrent infections. SYMPTOMS   Shortness of breath, especially with physical activity.   Deep, persistent (chronic) cough with a large amount of thick mucus.   Wheezing.   Rapid breaths (tachypnea).   Gray or bluish discoloration (cyanosis) of the skin, especially in fingers, toes, or lips.   Fatigue.   Weight loss.   Frequent infections or episodes when breathing symptoms become much worse (exacerbations).   Chest tightness. DIAGNOSIS  Your health care provider will take a medical history and perform a physical examination to make the initial diagnosis. Additional tests for COPD may include:   Lung (pulmonary) function tests.  Chest  X-ray.  CT scan.  Blood tests. TREATMENT  Treatment available to help you feel better when you have COPD includes:   Inhaler and nebulizer medicines. These help manage the symptoms of COPD and make your breathing more comfortable.  Supplemental oxygen. Supplemental oxygen is only helpful if you have a low oxygen level in your blood.   Exercise and physical activity. These are beneficial for nearly all people with COPD. Some people may also benefit from a pulmonary rehabilitation program. HOME CARE INSTRUCTIONS   Take all medicines (inhaled or pills) as directed by your health care provider.  Avoid over-the-counter medicines or cough syrups that dry up your airway (such as antihistamines) and slow down the elimination of secretions unless instructed otherwise by your health care provider.   If you are a smoker, the most important thing that you can do is stop smoking. Continuing to smoke will cause further lung damage and breathing trouble. Ask your health care provider for help with quitting smoking. He or she can direct you to community resources or hospitals that provide support.  Avoid exposure to irritants such as smoke, chemicals, and fumes that aggravate your breathing.  Use oxygen therapy and pulmonary rehabilitation if directed by your health care provider. If you require home oxygen therapy, ask your health care provider whether you should purchase a pulse oximeter to measure your oxygen level at home.   Avoid contact with individuals who have a contagious illness.  Avoid extreme temperature and humidity changes.  Eat healthy foods. Eating smaller, more frequent meals and resting before meals may help you maintain  your strength.  Stay active, but balance activity with periods of rest. Exercise and physical activity will help you maintain your ability to do things you want to do.  Preventing infection and hospitalization is very important when you have COPD. Make sure to  receive all the vaccines your health care provider recommends, especially the pneumococcal and influenza vaccines. Ask your health care provider whether you need a pneumonia vaccine.  Learn and use relaxation techniques to manage stress.  Learn and use controlled breathing techniques as directed by your health care provider. Controlled breathing techniques include:   Pursed lip breathing. Start by breathing in (inhaling) through your nose for 1 second. Then, purse your lips as if you were going to whistle and breathe out (exhale) through the pursed lips for 2 seconds.   Diaphragmatic breathing. Start by putting one hand on your abdomen just above your waist. Inhale slowly through your nose. The hand on your abdomen should move out. Then purse your lips and exhale slowly. You should be able to feel the hand on your abdomen moving in as you exhale.   Learn and use controlled coughing to clear mucus from your lungs. Controlled coughing is a series of short, progressive coughs. The steps of controlled coughing are:  1. Lean your head slightly forward.  2. Breathe in deeply using diaphragmatic breathing.  3. Try to hold your breath for 3 seconds.  4. Keep your mouth slightly open while coughing twice.  5. Spit any mucus out into a tissue.  6. Rest and repeat the steps once or twice as needed. SEEK MEDICAL CARE IF:   You are coughing up more mucus than usual.   There is a change in the color or thickness of your mucus.   Your breathing is more labored than usual.   Your breathing is faster than usual.  SEEK IMMEDIATE MEDICAL CARE IF:   You have shortness of breath while you are resting.   You have shortness of breath that prevents you from:  Being able to talk.   Performing your usual physical activities.   You have chest pain lasting longer than 5 minutes.   Your skin color is more cyanotic than usual.  You measure low oxygen saturations for longer than 5 minutes  with a pulse oximeter. MAKE SURE YOU:   Understand these instructions.  Will watch your condition.  Will get help right away if you are not doing well or get worse. Document Released: 07/31/2005 Document Revised: 03/07/2014 Document Reviewed: 06/17/2013 [facility] Patient Information 2015 [place], [place]. This information is not intended to replace advice given to you by your health care provider. Make sure you discuss any questions you have with your health care provider.

## 2015-03-14 NOTE — Progress Notes (Signed)
Pt requested sleeping medicine. Pt has been in bed, eyes closed with no sign of distress or anxiety since medicine has come to the floor. Will continue to monitor.

## 2015-03-14 NOTE — Progress Notes (Signed)
Inpatient Progress Note - Internal Medicine  Subjective: This morning the patient still feels tired. She denies productive cough. She was able to sleep well and her appetite is good this morning. She is concerned about changes in her medication, new PCP, and insurance coverage of her hospital stay. She is amenable with discharge today if her ambulatory SpO2 is reassuring.  Objective: Vital signs in last 24 hours: Filed Vitals:   03/13/15 2028 03/13/15 2113 03/14/15 0552 03/14/15 0753  BP: 148/48  163/78 180/58  Pulse: 61  61 51  Temp: 97.6 F (36.4 C)  97.4 F (36.3 C) 98.1 F (36.7 C)  TempSrc: Oral  Oral Oral  Resp: 16  18 18   Height: 5\' 2"  (1.575 m)     Weight: 142 lb 10.2 oz (64.7 kg)     SpO2: 93% 94% 97% 98%   Weight change: -2 lb 5.8 oz (-1.072 kg)  Intake/Output Summary (Last 24 hours) at 03/14/15 0844 Last data filed at 03/14/15 0753  Gross per 24 hour  Intake    480 ml  Output      0 ml  Net    480 ml   BP 180/58 mmHg  Pulse 51  Temp(Src) 98.1 F (36.7 C) (Oral)  Resp 18  Ht 5\' 2"  (1.575 m)  Wt 142 lb 10.2 oz (64.7 kg)  BMI 26.08 kg/m2  SpO2 98% on 2L [place]  General Appearance:    Alert, conversant woman eating breakfast  Eyes:    Conjunctiva/corneas clear  Throat:   MMM  Lungs:     Mild scattered rhonchi, improved from yesterday. Normal work of breathing.   Heart:    Regular rate and rhythm, S1 and S2 normal, no murmur, rub   or gallop  Abdomen:     Soft, non-tender, non-distended  Extremities:   No pedal edema   Lab Results: Basic Metabolic Panel:  Recent Labs Lab 03/13/15 0527 03/14/15 0558  NA 126* 129*  K 3.5 3.3*  CL 89* 91*  CO2 25 26  GLUCOSE 253* 210*  BUN 16 21*  CREATININE 0.94 0.92  CALCIUM 8.9 9.4  MG  --  1.4*   CBG:  Recent Labs Lab 03/12/15 1821 03/12/15 2130 03/13/15 0740 03/13/15 1109 03/13/15 2215 03/14/15 0751  GLUCAP 228* 295* 263* 326* 278* 203*   Studies/Results: Dg Chest 2 View  03/12/2015   CLINICAL DATA:   Weakness.  Productive cough.  EXAM: CHEST  2 VIEW  COMPARISON:  None.  FINDINGS: Normal sized heart. Clear lungs. Minimal diffuse peribronchial thickening and accentuation of the interstitial markings. Mild scoliosis. Upper lumbar spine degenerative changes. Cholecystectomy clips.  IMPRESSION: Minimal bronchitic changes.   Electronically Signed   By: Claudie Revering M.D.   On: 03/12/2015 13:29   Medications: I have reviewed the patient's current medications. Scheduled Meds: . atenolol  50 mg Oral Daily  . enoxaparin (LOVENOX) injection  40 mg Subcutaneous Q24H  . insulin aspart  0-15 Units Subcutaneous TID WC  . insulin aspart  0-5 Units Subcutaneous QHS  . insulin aspart protamine- aspart  10 Units Subcutaneous BID WC  . losartan  25 mg Oral Daily  . magnesium sulfate 1 - 4 g bolus IVPB  2 g Intravenous Once  . potassium chloride  40 mEq Oral Once  . predniSONE  40 mg Oral Q breakfast  . ramelteon  8 mg Oral QHS  . simvastatin  20 mg Oral q1800   Continuous Infusions:  PRN Meds:.albuterol Assessment/Plan: Active Problems:  COPD exacerbation  COPD Exacerbation: Pt has 60 pack-year smoking history and approximately 1 similar episode per year. She has never had PFTs. She is s/p 5 days of daily 500 mg levofloxacin and 20 mg prednisone with improvement in her cough but no change in her shortness of breath. CXR demonstrates minimal bronchitic changes, no effusion/mass/infiltrate. - s/p Methylprednisolone 125 mg x 1,  - prednisone burst 40 mg x 5 days (D1 = 5/9) - Duonebs q4hrs - Albuterol rescue inhaler q2hrs PRN - O2 via [place] for SpO2 >92%. Currently on 2L - ambulate w/o O2 today, check ambulatory SpO2. Potentially d/c to home if ambulation off O2 reassuring. - Pt has no local primary care - establish primary care for: PFTs, LDCT yearly, +/- pulmonary rehab - At d/c, start Spiriva inhaler daily, continue Albuterol rescue inhaler PRN  Type II Diabetes Mellitus: Pt previously taking Novolog  70/30, 30-40 units BID with meals. Per pt's son, pt does not have good understanding of how much insulin to take and has had episodes of confusion and tremor after insulin injection. - Hgb A1c 8.2 on admission - 10 units insulin 70/30 BID, + SSI-R - Diabetes educator has seen pt for insulin teaching - CC diet  Hyponatremia: Pt presented with serum sodium of 128. Clinically euvolemic. Urine sodium inappropriately high at 82, urine osmolality 561. DDx includes medication effect, SIADH/reset osmostat, hypothyroidism, adrenal insufficiency. - D/C home chlorthalidone - Repeat BMP 03/13/15 PM demonstrates low K at 3.3, low Mg at 1.4. Replete Mg (2g x 2), K (40 mEq PO x2) - TSH WNL at 0.484  HTN: Home meds = Tenoretic 50-25 mg daily - Continue atenolol 50 mg daily, hold chlorthalidone for hyponatremia - Start losartan 25 mg daily for BP control in T2DM  HLD: Continue home simvastatin 20 mg daily  PPX: lovenox for DVT PPX  Dispo: Disposition is deferred at this time, awaiting improvement of current medical problems.  Anticipated discharge in approximately 1 day or less.   The patient does not have a current PCP (Provider Not In System) and does need an [facility] hospital follow-up appointment after discharge.  The patient does not have transportation limitations that hinder transportation to clinic appointments.  .Services Needed at time of discharge: Y = Yes, Blank = No PT:   OT:   RN:   Equipment:   Other:     LOS: 2 days   Susa Day, Med Student 03/14/2015, 8:44 AM

## 2015-03-22 ENCOUNTER — Ambulatory Visit (INDEPENDENT_AMBULATORY_CARE_PROVIDER_SITE_OTHER): Payer: Medicare Other | Admitting: Internal Medicine

## 2015-03-22 ENCOUNTER — Encounter: Payer: Self-pay | Admitting: Internal Medicine

## 2015-03-22 VITALS — BP 160/50 | HR 57 | Temp 97.7°F | Wt 137.8 lb

## 2015-03-22 DIAGNOSIS — J449 Chronic obstructive pulmonary disease, unspecified: Secondary | ICD-10-CM | POA: Diagnosis not present

## 2015-03-22 DIAGNOSIS — F17210 Nicotine dependence, cigarettes, uncomplicated: Secondary | ICD-10-CM

## 2015-03-22 DIAGNOSIS — E1165 Type 2 diabetes mellitus with hyperglycemia: Secondary | ICD-10-CM

## 2015-03-22 DIAGNOSIS — I10 Essential (primary) hypertension: Secondary | ICD-10-CM

## 2015-03-22 DIAGNOSIS — E871 Hypo-osmolality and hyponatremia: Secondary | ICD-10-CM | POA: Diagnosis not present

## 2015-03-22 DIAGNOSIS — Z09 Encounter for follow-up examination after completed treatment for conditions other than malignant neoplasm: Secondary | ICD-10-CM | POA: Diagnosis not present

## 2015-03-22 DIAGNOSIS — J441 Chronic obstructive pulmonary disease with (acute) exacerbation: Secondary | ICD-10-CM

## 2015-03-22 DIAGNOSIS — E119 Type 2 diabetes mellitus without complications: Secondary | ICD-10-CM

## 2015-03-22 DIAGNOSIS — R634 Abnormal weight loss: Secondary | ICD-10-CM | POA: Diagnosis not present

## 2015-03-22 DIAGNOSIS — Z794 Long term (current) use of insulin: Secondary | ICD-10-CM | POA: Diagnosis not present

## 2015-03-22 LAB — BASIC METABOLIC PANEL
BUN: 15 mg/dL (ref 6–23)
CO2: 28 mEq/L (ref 19–32)
Calcium: 9 mg/dL (ref 8.4–10.5)
Chloride: 93 mEq/L — ABNORMAL LOW (ref 96–112)
Creat: 0.83 mg/dL (ref 0.50–1.10)
GLUCOSE: 91 mg/dL (ref 70–99)
Potassium: 3.2 mEq/L — ABNORMAL LOW (ref 3.5–5.3)
Sodium: 130 mEq/L — ABNORMAL LOW (ref 135–145)

## 2015-03-22 NOTE — Assessment & Plan Note (Signed)
Lab Results  Component Value Date   HGBA1C 8.2* 03/12/2015     Assessment: Diabetes control:   Progress toward A1C goal:    Comments: pt not following insulin correctly and has had blackouts before likely in the setting of hypoglycemia. Pt will take anywhere between 10-30 units of insulin at a time based on her own perception of control.   Plan: Medications:  ONLY NovoLog 70/3010 units twice a day Home glucose monitoring: Frequency:   Timing:   Instruction/counseling given: reminded to bring blood glucose meter & log to each visit, reminded to bring medications to each visit and discussed diet Educational resources provided:   Self management tools provided:   Other plans: Patient will follow-up in one month with meter readings for titration

## 2015-03-22 NOTE — Assessment & Plan Note (Signed)
This has resolved since her hospitalization. The patient does continue to smoke and has not established care here therefore the patient does not have any PFTs. There is significant concern for underlying malignancy given the hyponatremia, significant weight loss and ongoing smoking. The patient has not been using her inhalers as prescribed and extensive education into proper use was discussed with the patient. -Continue albuterol and Spiriva -referral to pulm for PFTs

## 2015-03-22 NOTE — Progress Notes (Signed)
Subjective:   Patient ID: Stefanie Vincent female   DOB: 1941/03/29 74 y.o.   MRN: [id number]  HPI: [name] is a 74 y.o. woman with a past medical history as listed below who presents for hospital follow-up.  Patient was discharged on 03/14/15 with COPD exacerbation, hyponatremia 2/2 chlorthalidone and this was discontinued, diabetes and hypertension. Patient has extensive smoking history and is still smoking about 1ppd now.   DM - Patient checking blood sugars 1-3 times daily, she does "whenever I feel I need to." Currently taking novolog 70/30 10-30 units BID not as prescribed. The patient feels she needs to compensate for when she "gets too high" altohough pt doesn't have a cutoff value that she uses to make this decision. No hypoglycemic episodes since last visit. denies polyuria, polydipsia, nausea, vomiting, diarrhea.  does not request refills today.  She has felt fatigued, low energy, decreased appetite and with significant weight loss (unintentional) since discharge. She is frustrated with the Spiriva inhaler given the capsule method and has her grandson doing this but not consistently. She has some on and off wheezing that is relieved with her albuterol which she is using anywhere between one to 2 times per day.  Hypertension ROS: not taking medications regularly as instructed, no medication side effects noted, patient does not perform home BP monitoring, no TIA's, no chest pain on exertion, notes stable dyspnea on exertion, no change and no swelling of ankles. She has not been taking the atenolol.    Past Medical History  Diagnosis Date  . Arthritis   . Blood transfusion without reported diagnosis   . Hypertension   . Diabetes mellitus without complication    Current Outpatient Prescriptions  Medication Sig Dispense Refill  . albuterol (PROVENTIL HFA;VENTOLIN HFA) 108 (90 BASE) MCG/ACT inhaler Inhale 2 puffs into the lungs every 6 (six) hours as needed for wheezing or  shortness of breath (cough, shortness of breath or wheezing.). 1 Inhaler 1  . atenolol (TENORMIN) 50 MG tablet Take 1 tablet (50 mg total) by mouth daily. 30 tablet 0  . insulin aspart protamine- aspart (NOVOLOG MIX 70/30) (70-30) 100 UNIT/ML injection Inject 0.1 mLs (10 Units total) into the skin 2 (two) times daily with a meal. 10 mL 11  . loratadine (CLARITIN) 10 MG tablet Take 10 mg by mouth daily as needed for allergies.    Marland Kitchen losartan (COZAAR) 25 MG tablet Take 1 tablet (25 mg total) by mouth daily. 30 tablet 0  . predniSONE (DELTASONE) 20 MG tablet Take 2 tablets (40 mg total) by mouth daily with breakfast. 2 tablet 0  . rOPINIRole (REQUIP) 0.5 MG tablet Take 0.5 mg by mouth at bedtime as needed (FOR RESTLESS LEGS).    Marland Kitchen simvastatin (ZOCOR) 20 MG tablet Take 20 mg by mouth daily.    Marland Kitchen tiotropium (SPIRIVA) 18 MCG inhalation capsule Place 1 capsule (18 mcg total) into inhaler and inhale daily. 30 capsule 2  . vitamin D, CHOLECALCIFEROL, 400 UNITS tablet Take 400 Units by mouth daily.     No current facility-administered medications for this visit.   Family History  Problem Relation Age of Onset  . Stroke Mother   . Cancer Father    History   Social History  . Marital Status: Widowed    Spouse Name: N/A  . Number of Children: N/A  . Years of Education: N/A   Social History Main Topics  . Smoking status: Current Every Day Smoker -- 1.00 packs/day for 60  years    Types: Cigarettes  . Smokeless tobacco: Never Used  . Alcohol Use: No  . Drug Use: No  . Sexual Activity: Not on file   Other Topics Concern  . Not on file   Social History Narrative   Review of Systems: Pertinent items are noted in HPI. Objective:  Physical Exam: Filed Vitals:   03/22/15 1548  BP: 160/50  Pulse: 57  Temp: 97.7 F (36.5 C)  TempSrc: Oral  Weight: 137 lb 12.8 oz (62.506 kg)  SpO2: 100%   General: sitting in chair, NAD Cardiac: RRR, no rubs, murmurs or gallops Pulm: prolonged expiratory  phase, some expiratory wheezes, moving normal volumes of air Abd: soft, nontender, nondistended, BS present Ext: warm and well perfused, no pedal edema  Assessment & Plan:  Please see problem oriented charting  Pt discussed with Dr. Daryll Drown

## 2015-03-22 NOTE — Assessment & Plan Note (Addendum)
BMP Latest Ref Rng 03/14/2015 03/13/2015 03/12/2015  Glucose 70 - 99 mg/dL 210(H) 253(H) 207(H)  BUN 6 - 20 mg/dL 21(H) 16 15  Creatinine 0.44 - 1.00 mg/dL 0.92 0.94 0.96  Sodium 135 - 145 mmol/L 129(L) 126(L) 128(L)  Potassium 3.5 - 5.1 mmol/L 3.3(L) 3.5 3.7  Chloride 101 - 111 mmol/L 91(L) 89(L) 90(L)  CO2 22 - 32 mmol/L 26 25 26   Calcium 8.9 - 10.3 mg/dL 9.4 8.9 9.0   Pt has had low Na. This was originally thought to be secondary to chlorthalidone which was stopped inpatient. The patient is unsure if she has stopped taking this medication. Repeat labs done today. Again this is concerning for possible paraneoplastic syndrome in the setting of 60+ pack year and current smoking status along with weight loss.

## 2015-03-22 NOTE — Assessment & Plan Note (Signed)
BP Readings from Last 3 Encounters:  03/22/15 160/50  03/14/15 142/54  03/08/15 129/72    Lab Results  Component Value Date   NA 129* 03/14/2015   K 3.3* 03/14/2015   CREATININE 0.92 03/14/2015    Assessment: Blood pressure control:   Progress toward BP goal:    Comments: pt has not been taking medications as prescribed and states "doesn't really know if she threw out the chlorthalidone that was stopped inpt"  Plan: Medications:  Continue atenolol and cozaar (mayneed to consider alternative to BB given COPD hx) Educational resources provided:   Self management tools provided:   Other plans: f/u in 1 month, repeat Bmet obtained today

## 2015-03-22 NOTE — Patient Instructions (Signed)
General Instructions:   Please bring your medicines with you each time you come to clinic.  Medicines may include prescription medications, over-the-counter medications, herbal remedies, eye drops, vitamins, or other pills.   For your diabetes: ONLY take 10 units of insulin twice a day, follow up in 1 month   For your lungs: take the Spiriva and albuterol, we will get you to a lung doctor    Progress Toward Treatment Goals:  No flowsheet data found.  Self Care Goals & Plans:  No flowsheet data found.  No flowsheet data found.   Care Management & Community Referrals:  No flowsheet data found.

## 2015-03-22 NOTE — Assessment & Plan Note (Signed)
Wt Readings from Last 3 Encounters:  03/22/15 137 lb 12.8 oz (62.506 kg)  03/13/15 142 lb 10.2 oz (64.7 kg)  03/08/15 145 lb (65.772 kg)   Pt has had significant weight loss with 5 lbs in the last 9 days. This is concerning for underlying malignancy.  -low dose CT chest for lung CA screening

## 2015-03-23 NOTE — Progress Notes (Signed)
Internal Medicine Clinic Attending  Case discussed with Dr. Sadek soon after the resident saw the patient.  We reviewed the resident's history and exam and pertinent patient test results.  I agree with the assessment, diagnosis, and plan of care documented in the resident's note. 

## 2015-03-27 ENCOUNTER — Ambulatory Visit (HOSPITAL_COMMUNITY): Payer: Medicare Other

## 2015-03-27 LAB — GLUCOSE, CAPILLARY: GLUCOSE-CAPILLARY: 121 mg/dL — AB (ref 65–99)

## 2015-03-29 ENCOUNTER — Encounter: Payer: Self-pay | Admitting: *Deleted

## 2015-03-30 ENCOUNTER — Ambulatory Visit (HOSPITAL_COMMUNITY): Payer: Medicare Other | Attending: Internal Medicine

## 2015-04-10 ENCOUNTER — Observation Stay (HOSPITAL_COMMUNITY)
Admission: EM | Admit: 2015-04-10 | Discharge: 2015-04-11 | Disposition: A | Discharge status: Home / Self Care | Payer: Medicare Other | Attending: Internal Medicine | Admitting: Internal Medicine

## 2015-04-10 ENCOUNTER — Encounter (HOSPITAL_COMMUNITY): Payer: Self-pay | Admitting: Emergency Medicine

## 2015-04-10 DIAGNOSIS — R109 Unspecified abdominal pain: Secondary | ICD-10-CM

## 2015-04-10 DIAGNOSIS — K868 Other specified diseases of pancreas: Secondary | ICD-10-CM | POA: Insufficient documentation

## 2015-04-10 DIAGNOSIS — Z794 Long term (current) use of insulin: Secondary | ICD-10-CM | POA: Diagnosis not present

## 2015-04-10 DIAGNOSIS — E1165 Type 2 diabetes mellitus with hyperglycemia: Secondary | ICD-10-CM

## 2015-04-10 DIAGNOSIS — R55 Syncope and collapse: Secondary | ICD-10-CM | POA: Diagnosis present

## 2015-04-10 DIAGNOSIS — R140 Abdominal distension (gaseous): Secondary | ICD-10-CM

## 2015-04-10 DIAGNOSIS — J449 Chronic obstructive pulmonary disease, unspecified: Secondary | ICD-10-CM | POA: Insufficient documentation

## 2015-04-10 DIAGNOSIS — E785 Hyperlipidemia, unspecified: Secondary | ICD-10-CM | POA: Diagnosis not present

## 2015-04-10 DIAGNOSIS — E871 Hypo-osmolality and hyponatremia: Secondary | ICD-10-CM | POA: Diagnosis not present

## 2015-04-10 DIAGNOSIS — D72829 Elevated white blood cell count, unspecified: Secondary | ICD-10-CM | POA: Insufficient documentation

## 2015-04-10 DIAGNOSIS — Z7951 Long term (current) use of inhaled steroids: Secondary | ICD-10-CM | POA: Insufficient documentation

## 2015-04-10 DIAGNOSIS — K5900 Constipation, unspecified: Secondary | ICD-10-CM | POA: Diagnosis not present

## 2015-04-10 DIAGNOSIS — Z886 Allergy status to analgesic agent status: Secondary | ICD-10-CM | POA: Diagnosis not present

## 2015-04-10 DIAGNOSIS — Z79899 Other long term (current) drug therapy: Secondary | ICD-10-CM | POA: Insufficient documentation

## 2015-04-10 DIAGNOSIS — E119 Type 2 diabetes mellitus without complications: Secondary | ICD-10-CM | POA: Diagnosis not present

## 2015-04-10 DIAGNOSIS — J441 Chronic obstructive pulmonary disease with (acute) exacerbation: Secondary | ICD-10-CM | POA: Diagnosis present

## 2015-04-10 DIAGNOSIS — I10 Essential (primary) hypertension: Secondary | ICD-10-CM | POA: Insufficient documentation

## 2015-04-10 DIAGNOSIS — Z9049 Acquired absence of other specified parts of digestive tract: Secondary | ICD-10-CM | POA: Insufficient documentation

## 2015-04-10 DIAGNOSIS — F17210 Nicotine dependence, cigarettes, uncomplicated: Secondary | ICD-10-CM | POA: Insufficient documentation

## 2015-04-10 DIAGNOSIS — R197 Diarrhea, unspecified: Secondary | ICD-10-CM | POA: Insufficient documentation

## 2015-04-10 DIAGNOSIS — E876 Hypokalemia: Principal | ICD-10-CM | POA: Insufficient documentation

## 2015-04-10 MED ORDER — SODIUM CHLORIDE 0.9 % IV BOLUS (SEPSIS)
1000.0000 mL | Freq: Once | INTRAVENOUS | Status: AC
Start: 2015-04-11 — End: 2015-04-11
  Administered 2015-04-11: 1000 mL via INTRAVENOUS

## 2015-04-10 NOTE — ED Provider Notes (Signed)
CSN: [id number]     Arrival date & time 04/10/15  2325 History  This chart was scribed for Stefanie Hacker, MD by Stefanie Vincent, ED Scribe. This patient was seen in room B14C/B14C and the patient's care was started at 11:49 PM.    Chief Complaint  Patient presents with  . Bloated   The history is provided by the patient. No language interpreter was used.     HPI Comments: Stefanie Vincent is a 74 y.o. female with past medical history of DM, HTN who presents to the Emergency Department complaining of near-syncope with nausea and vomiting just PTA this evening. Patient explains her symptoms began with sudden onset right calf cramping after rising to standing this evening. She states she became dizzy, lightheaded, and nauseated just after her pain began; she notes a small episode of vomiting at that time. She reports mild abdominal discomfort throughout the day today which she attributes to constipation; states she has not had a bowel movement in several days which is unusual for her. She believes that her abdomen is distended. Patient states she feels well at present. Denies CP, SOB.  She is a 1ppd smoker; has been for the past 60 years. Prior surgical history includes cholecystectomy.   Past Medical History  Diagnosis Date  . Arthritis   . Blood transfusion without reported diagnosis   . Hypertension   . Diabetes mellitus without complication    Past Surgical History  Procedure Laterality Date  . Cholecystectomy     Family History  Problem Relation Age of Onset  . Stroke Mother   . Cancer Father    History  Substance Use Topics  . Smoking status: Current Every Day Smoker -- 1.00 packs/day for 60 years    Types: Cigarettes  . Smokeless tobacco: Never Used     Comment: Has cut back lately.  . Alcohol Use: No   OB History    No data available     Review of Systems  Constitutional: Negative for fever.  Respiratory: Negative for chest tightness and shortness of breath.    Cardiovascular: Negative for chest pain and leg swelling.  Gastrointestinal: Positive for nausea, vomiting and abdominal distention. Negative for abdominal pain and diarrhea.  Genitourinary: Negative for dysuria.  Musculoskeletal: Positive for myalgias. Negative for back pain.  Neurological: Positive for light-headedness. Negative for headaches.  Psychiatric/Behavioral: Negative for confusion.  All other systems reviewed and are negative.     Allergies  Codeine  Home Medications   Prior to Admission medications   Medication Sig Start Date End Date Taking? Authorizing Provider  albuterol (PROVENTIL HFA;VENTOLIN HFA) 108 (90 BASE) MCG/ACT inhaler Inhale 2 puffs into the lungs every 6 (six) hours as needed for wheezing or shortness of breath (cough, shortness of breath or wheezing.). 03/08/15  Yes Deanna M Didiano, DO  atenolol-chlorthalidone (TENORETIC) 50-25 MG per tablet Take 1 tablet by mouth daily.   Yes Historical Provider, MD  insulin aspart protamine- aspart (NOVOLOG MIX 70/30) (70-30) 100 UNIT/ML injection Inject 0.1 mLs (10 Units total) into the skin 2 (two) times daily with a meal. 03/14/15  Yes Corky Sox, MD  loratadine (CLARITIN) 10 MG tablet Take 10 mg by mouth daily.    Yes Historical Provider, MD  omeprazole (PRILOSEC) 20 MG capsule Take 20 mg by mouth daily as needed (allergies).   Yes Historical Provider, MD  rOPINIRole (REQUIP) 0.5 MG tablet Take 0.5 mg by mouth at bedtime as needed (FOR RESTLESS LEGS).  Yes Historical Provider, MD  simvastatin (ZOCOR) 20 MG tablet Take 20 mg by mouth daily.   Yes Historical Provider, MD  tiotropium (SPIRIVA) 18 MCG inhalation capsule Place 1 capsule (18 mcg total) into inhaler and inhale daily. 03/14/15  Yes Cresenciano Genre, MD  vitamin D, CHOLECALCIFEROL, 400 UNITS tablet Take 400 Units by mouth daily.   Yes Historical Provider, MD  atenolol (TENORMIN) 50 MG tablet Take 1 tablet (50 mg total) by mouth daily. Patient not taking: Reported  on 04/11/2015 03/14/15   Cresenciano Genre, MD  losartan (COZAAR) 25 MG tablet Take 1 tablet (25 mg total) by mouth daily. Patient not taking: Reported on 04/11/2015 03/14/15   Cresenciano Genre, MD   BP 129/53 mmHg  Pulse 68  Temp(Src) 97.7 F (36.5 C) (Oral)  Resp 17  SpO2 100% Physical Exam  Constitutional: She is oriented to person, place, and time. No distress.  HENT:  Head: Normocephalic and atraumatic.  Eyes: Pupils are equal, round, and reactive to light.  Cardiovascular: Normal rate, regular rhythm and normal heart sounds.   No murmur heard. Pulmonary/Chest: Effort normal. No respiratory distress. She has wheezes.  Abdominal: Soft. Bowel sounds are normal. She exhibits distension. There is no tenderness. There is no rebound and no guarding.  Mild bruising noted over the mid to lower abdomen  Musculoskeletal: She exhibits no edema.  Neurological: She is alert and oriented to person, place, and time.  No dysmetria to finger-nose-finger, cranial nerves II through XII intact  Skin: Skin is warm and dry.  Psychiatric: She has a normal mood and affect.  Nursing note and vitals reviewed.   ED Course  Procedures   DIAGNOSTIC STUDIES: Oxygen Saturation is 100% on RA, normal by my interpretation.    COORDINATION OF CARE: 11:55 PM Discussed treatment plan with pt at bedside and pt agreed to plan.   Labs Review Labs Reviewed  CBC WITH DIFFERENTIAL/PLATELET - Abnormal; Notable for the following:    WBC 17.7 (*)    Neutrophils Relative % 79 (*)    Neutro Abs 13.8 (*)    All other components within normal limits  COMPREHENSIVE METABOLIC PANEL - Abnormal; Notable for the following:    Sodium 131 (*)    Potassium 2.5 (*)    Chloride 95 (*)    Glucose, Bld 117 (*)    Calcium 8.5 (*)    Total Protein 6.3 (*)    GFR calc non Af Amer 54 (*)    All other components within normal limits  URINALYSIS, ROUTINE W REFLEX MICROSCOPIC (NOT AT [facility]) - Abnormal; Notable for the following:     Leukocytes, UA MODERATE (*)    All other components within normal limits  URINE MICROSCOPIC-ADD ON  I-STAT CG4 LACTIC ACID, ED  I-STAT TROPOININ, ED    Imaging Review Ct Abdomen Pelvis W Contrast  04/11/2015   CLINICAL DATA:  Nausea, vomiting and bloating.  Constipation.  EXAM: CT ABDOMEN AND PELVIS WITH CONTRAST  TECHNIQUE: Multidetector CT imaging of the abdomen and pelvis was performed using the standard protocol following bolus administration of intravenous contrast.  CONTRAST:  110mL OMNIPAQUE IOHEXOL 300 MG/ML  SOLN  COMPARISON:  Radiographs earlier this day.  FINDINGS: No acute pleural parenchymal abnormality. Vascular calcifications are seen.  Clips in the gallbladder fossa from cholecystectomy. Minimal expected biliary prominence. No calcified choledocholithiasis. There is a tiny 3 mm hypodensity in the left lobe of the liver, too small to characterize. The spleen and adrenal glands are  normal. Multiple calcifications about the pancreatic tail without ductal dilatation. Pancreatic atrophy.  Kidneys demonstrate symmetric enhancement and excretion. There is a 1.4 cm cyst in the interpolar left kidney.  Stomach is physiologically distended with contrast. There are no dilated or thickened bowel loops. The appendix is normal. There is small volume of stool in the ascending colon, with liquid stool at the hepatic and splenic flexures and transverse colon. Small volume of stool and distal colon. No colonic wall thickening. No free air, free fluid, or intra-abdominal fluid collection.  No retroperitoneal adenopathy. Abdominal aorta is normal in caliber. Dense atherosclerosis without aneurysm.  Within the pelvis the bladder is distended. The uterus is atrophic, normal for age. The ovaries are atrophic, normal for age. There is a peripherally calcified density in the right ovary. No pelvic free fluid. No pelvic adenopathy.  Scattered soft tissue stranding in the anterior abdominal wall in a pattern suggestive  of injection sites.  Multilevel degenerative disc disease and facet arthropathy throughout the lumbar spine. There are no acute or suspicious osseous abnormalities. Diffuse bony under mineralization.  IMPRESSION: 1. Fluid-filled transverse colon without associated colonic wall thickening. Findings may reflect a diarrheal illness. 2. There is otherwise no acute abnormality in the abdomen/pelvis. Pancreatic calcifications suggesting sequela of prior pancreatitis. Diffuse atherosclerosis. Postcholecystectomy without disproportionate biliary dilatation.   Electronically Signed   By: Jeb Levering M.D.   On: 04/11/2015 02:58   Dg Abd Acute W/chest  04/11/2015   CLINICAL DATA:  Mid abdominal pain with bloating today.  EXAM: DG ABDOMEN ACUTE W/ 1V CHEST  COMPARISON:  Chest radiograph 03/12/2015  FINDINGS: The cardiomediastinal contours are normal. The lungs are clear. There is no free intra-abdominal air. Scattered air-fluid levels throughout bowel loops in the upper abdomen, likely within colon. Surgical clips in the right upper quadrant of the abdomen likely from appendectomy. Pelvic phleboliths and vascular calcifications are seen. Small volume of stool throughout the colon. No radiopaque calculi. No acute osseous abnormalities are seen. There is mild scoliotic curvature and degenerative change in the lumbar spine.  IMPRESSION: 1. Scattered air-fluid levels throughout bowel in the upper abdomen, likely colon. This can be seen in the setting of diarrhea/liquid stool or mild colitis. No free intra-abdominal air. 2. No acute pulmonary process.   Electronically Signed   By: Jeb Levering M.D.   On: 04/11/2015 00:57     EKG Interpretation   Date/Time:  Monday April 10 2015 23:58:26 EDT Ventricular Rate:  58 PR Interval:  185 QRS Duration: 99 QT Interval:  454 QTC Calculation: 446 R Axis:   27 Text Interpretation:  Sinus rhythm No significant change was found  Confirmed by HORTON  MD, COURTNEY ([id number]) on  04/11/2015 12:25:52 AM      MDM   Final diagnoses:  Hypokalemia  Near syncope  Abdominal distention    Patient presents with a near syncopal episode followed by emesis and abdominal bloating.  Vital signs are reassuring.  Orthostatic per EMS. No evidence of orthostasis here.  Patient given fluids.  Workup was notable for leukocytosis to 17. Also notable for a potassium of 2.5.  EKG is nonischemic without evidence of arrhythmia.  Patient given 20 mEq IV potassium with 2 g of magnesium and was started on normal saline with potassium.  This could be why patient had leg cramping prior to near-syncopal episode. Given leukocytosis, acute abdominal series and urinalysis obtained. No obvious infectious source. There are mild air-fluid levels on acute abdominal series. For this reason,  CT scan the abdomen obtained given bloating. No acute intra-abdominal pathology. Mild dilation and thickening of bowel wall which could be suggestive of a diarrheal illness; however, patient actually reports constipation over the last several days.  Patient continues to feel better. However, given the severity of her hypokalemia and the fact that she is aready taking potassium at home, will admit for potassium supplementation. Suspect this may be contributing to her near-syncopal event.  I personally performed the services described in this documentation, which was scribed in my presence. The recorded information has been reviewed and is accurate.      Stefanie Hacker, MD 04/11/15 [phone]

## 2015-04-11 ENCOUNTER — Encounter (HOSPITAL_COMMUNITY): Payer: Self-pay | Admitting: Radiology

## 2015-04-11 ENCOUNTER — Emergency Department (HOSPITAL_COMMUNITY): Payer: Medicare Other

## 2015-04-11 DIAGNOSIS — Z794 Long term (current) use of insulin: Secondary | ICD-10-CM

## 2015-04-11 DIAGNOSIS — R42 Dizziness and giddiness: Secondary | ICD-10-CM

## 2015-04-11 DIAGNOSIS — I10 Essential (primary) hypertension: Secondary | ICD-10-CM

## 2015-04-11 DIAGNOSIS — J449 Chronic obstructive pulmonary disease, unspecified: Secondary | ICD-10-CM

## 2015-04-11 DIAGNOSIS — E871 Hypo-osmolality and hyponatremia: Secondary | ICD-10-CM

## 2015-04-11 DIAGNOSIS — Z7951 Long term (current) use of inhaled steroids: Secondary | ICD-10-CM

## 2015-04-11 DIAGNOSIS — R55 Syncope and collapse: Secondary | ICD-10-CM | POA: Diagnosis present

## 2015-04-11 DIAGNOSIS — K861 Other chronic pancreatitis: Secondary | ICD-10-CM

## 2015-04-11 DIAGNOSIS — E119 Type 2 diabetes mellitus without complications: Secondary | ICD-10-CM

## 2015-04-11 DIAGNOSIS — E785 Hyperlipidemia, unspecified: Secondary | ICD-10-CM

## 2015-04-11 DIAGNOSIS — B9689 Other specified bacterial agents as the cause of diseases classified elsewhere: Secondary | ICD-10-CM

## 2015-04-11 DIAGNOSIS — D72829 Elevated white blood cell count, unspecified: Secondary | ICD-10-CM

## 2015-04-11 DIAGNOSIS — E876 Hypokalemia: Secondary | ICD-10-CM | POA: Diagnosis not present

## 2015-04-11 DIAGNOSIS — F17210 Nicotine dependence, cigarettes, uncomplicated: Secondary | ICD-10-CM

## 2015-04-11 DIAGNOSIS — R109 Unspecified abdominal pain: Secondary | ICD-10-CM

## 2015-04-11 DIAGNOSIS — K529 Noninfective gastroenteritis and colitis, unspecified: Secondary | ICD-10-CM

## 2015-04-11 DIAGNOSIS — Z9049 Acquired absence of other specified parts of digestive tract: Secondary | ICD-10-CM

## 2015-04-11 DIAGNOSIS — N390 Urinary tract infection, site not specified: Secondary | ICD-10-CM

## 2015-04-11 DIAGNOSIS — J309 Allergic rhinitis, unspecified: Secondary | ICD-10-CM

## 2015-04-11 LAB — CBC WITH DIFFERENTIAL/PLATELET
BASOS PCT: 0 % (ref 0–1)
Basophils Absolute: 0 10*3/uL (ref 0.0–0.1)
EOS ABS: 0.3 10*3/uL (ref 0.0–0.7)
EOS PCT: 1 % (ref 0–5)
HCT: 37.2 % (ref 36.0–46.0)
Hemoglobin: 13 g/dL (ref 12.0–15.0)
Lymphocytes Relative: 15 % (ref 12–46)
Lymphs Abs: 2.7 10*3/uL (ref 0.7–4.0)
MCH: 30.1 pg (ref 26.0–34.0)
MCHC: 34.9 g/dL (ref 30.0–36.0)
MCV: 86.1 fL (ref 78.0–100.0)
MONOS PCT: 5 % (ref 3–12)
Monocytes Absolute: 0.9 10*3/uL (ref 0.1–1.0)
NEUTROS ABS: 13.8 10*3/uL — AB (ref 1.7–7.7)
Neutrophils Relative %: 79 % — ABNORMAL HIGH (ref 43–77)
Platelets: 283 10*3/uL (ref 150–400)
RBC: 4.32 MIL/uL (ref 3.87–5.11)
RDW: 13.6 % (ref 11.5–15.5)
WBC: 17.7 10*3/uL — ABNORMAL HIGH (ref 4.0–10.5)

## 2015-04-11 LAB — BASIC METABOLIC PANEL
Anion gap: 8 (ref 5–15)
BUN: 9 mg/dL (ref 6–20)
CALCIUM: 8 mg/dL — AB (ref 8.9–10.3)
CO2: 25 mmol/L (ref 22–32)
Chloride: 98 mmol/L — ABNORMAL LOW (ref 101–111)
Creatinine, Ser: 0.86 mg/dL (ref 0.44–1.00)
GFR calc Af Amer: >60 mL/min (ref >60)
GFR calc non Af Amer: >60 mL/min (ref >60)
Glucose, Bld: 222 mg/dL — ABNORMAL HIGH (ref 65–99)
Potassium: 3.4 mmol/L — ABNORMAL LOW (ref 3.5–5.1)
Sodium: 131 mmol/L — ABNORMAL LOW (ref 135–145)

## 2015-04-11 LAB — URINALYSIS, ROUTINE W REFLEX MICROSCOPIC
BILIRUBIN URINE: NEGATIVE
GLUCOSE, UA: NEGATIVE mg/dL
Hgb urine dipstick: NEGATIVE
Ketones, ur: NEGATIVE mg/dL
NITRITE: NEGATIVE
PH: 6.5 (ref 5.0–8.0)
Protein, ur: NEGATIVE mg/dL
Specific Gravity, Urine: 1.009 (ref 1.005–1.030)
UROBILINOGEN UA: 0.2 mg/dL (ref 0.0–1.0)

## 2015-04-11 LAB — COMPREHENSIVE METABOLIC PANEL
ALT: 22 U/L (ref 14–54)
AST: 22 U/L (ref 15–41)
Albumin: 3.6 g/dL (ref 3.5–5.0)
Alkaline Phosphatase: 75 U/L (ref 38–126)
Anion gap: 10 (ref 5–15)
BUN: 14 mg/dL (ref 6–20)
CO2: 26 mmol/L (ref 22–32)
Calcium: 8.5 mg/dL — ABNORMAL LOW (ref 8.9–10.3)
Chloride: 95 mmol/L — ABNORMAL LOW (ref 101–111)
Creatinine, Ser: 1 mg/dL (ref 0.44–1.00)
GFR calc Af Amer: >60 mL/min (ref >60)
GFR calc non Af Amer: 54 mL/min — ABNORMAL LOW (ref >60)
GLUCOSE: 117 mg/dL — AB (ref 65–99)
Potassium: 2.5 mmol/L — CL (ref 3.5–5.1)
Sodium: 131 mmol/L — ABNORMAL LOW (ref 135–145)
TOTAL PROTEIN: 6.3 g/dL — AB (ref 6.5–8.1)
Total Bilirubin: 0.4 mg/dL (ref 0.3–1.2)

## 2015-04-11 LAB — MAGNESIUM: MAGNESIUM: 1.9 mg/dL (ref 1.7–2.4)

## 2015-04-11 LAB — I-STAT TROPONIN, ED: Troponin i, poc: 0 ng/mL (ref 0.00–0.08)

## 2015-04-11 LAB — URINE MICROSCOPIC-ADD ON

## 2015-04-11 LAB — CBG MONITORING, ED
GLUCOSE-CAPILLARY: 140 mg/dL — AB (ref 65–99)
Glucose-Capillary: 203 mg/dL — ABNORMAL HIGH (ref 65–99)

## 2015-04-11 LAB — TROPONIN I

## 2015-04-11 LAB — I-STAT CG4 LACTIC ACID, ED: LACTIC ACID, VENOUS: 1.47 mmol/L (ref 0.5–2.0)

## 2015-04-11 MED ORDER — ACETAMINOPHEN 325 MG PO TABS: 650.0000 mg | ORAL_TABLET | Freq: Four times a day (QID) | ORAL | Status: DC | PRN

## 2015-04-11 MED ORDER — TIOTROPIUM BROMIDE MONOHYDRATE 18 MCG IN CAPS
18.0000 ug | ORAL_CAPSULE | Freq: Every day | RESPIRATORY_TRACT | Status: DC
  Administered 2015-04-11: 18 ug via RESPIRATORY_TRACT
  Filled 2015-04-11: qty 5

## 2015-04-11 MED ORDER — POTASSIUM CHLORIDE IN NACL 20-0.9 MEQ/L-% IV SOLN
Freq: Once | INTRAVENOUS | Status: AC
  Administered 2015-04-11: 05:00:00 via INTRAVENOUS
  Filled 2015-04-11: qty 1000

## 2015-04-11 MED ORDER — CHOLESTYRAMINE 4 G PO PACK: 4.0000 g | PACK | Freq: Two times a day (BID) | ORAL | Status: DC

## 2015-04-11 MED ORDER — POTASSIUM CHLORIDE 10 MEQ/100ML IV SOLN
10.0000 meq | Freq: Once | INTRAVENOUS | Status: AC
  Administered 2015-04-11: 10 meq via INTRAVENOUS
  Filled 2015-04-11: qty 100

## 2015-04-11 MED ORDER — ALBUTEROL SULFATE HFA 108 (90 BASE) MCG/ACT IN AERS: 2.0000 | INHALATION_SPRAY | Freq: Four times a day (QID) | RESPIRATORY_TRACT | Status: DC | PRN

## 2015-04-11 MED ORDER — SULFAMETHOXAZOLE-TRIMETHOPRIM 800-160 MG PO TABS: 1.0000 | ORAL_TABLET | Freq: Two times a day (BID) | ORAL | Status: DC

## 2015-04-11 MED ORDER — ATENOLOL 50 MG PO TABS
50.0000 mg | ORAL_TABLET | Freq: Every day | ORAL | Status: AC
Start: 2015-04-11 — End: ?

## 2015-04-11 MED ORDER — MAGNESIUM SULFATE 2 GM/50ML IV SOLN
2.0000 g | Freq: Once | INTRAVENOUS | Status: AC
  Administered 2015-04-11: 2 g via INTRAVENOUS
  Filled 2015-04-11: qty 50

## 2015-04-11 MED ORDER — ACETAMINOPHEN 650 MG RE SUPP: 650.0000 mg | Freq: Four times a day (QID) | RECTAL | Status: DC | PRN

## 2015-04-11 MED ORDER — SULFAMETHOXAZOLE-TRIMETHOPRIM 800-160 MG PO TABS
1.0000 | ORAL_TABLET | Freq: Two times a day (BID) | ORAL | Status: DC
  Administered 2015-04-11: 1 via ORAL
  Filled 2015-04-11: qty 1

## 2015-04-11 MED ORDER — IOHEXOL 300 MG/ML  SOLN
100.0000 mL | Freq: Once | INTRAMUSCULAR | Status: AC | PRN
  Administered 2015-04-11: 100 mL via INTRAVENOUS

## 2015-04-11 MED ORDER — FLUTICASONE PROPIONATE 50 MCG/ACT NA SUSP
1.0000 | Freq: Every day | NASAL | Status: DC
  Administered 2015-04-11: 1 via NASAL
  Filled 2015-04-11: qty 16

## 2015-04-11 MED ORDER — ENOXAPARIN SODIUM 40 MG/0.4ML ~~LOC~~ SOLN
40.0000 mg | SUBCUTANEOUS | Status: DC
  Administered 2015-04-11: 40 mg via SUBCUTANEOUS
  Filled 2015-04-11 (×2): qty 0.4

## 2015-04-11 MED ORDER — IOHEXOL 300 MG/ML  SOLN
25.0000 mL | Freq: Once | INTRAMUSCULAR | Status: AC | PRN
  Administered 2015-04-11: 25 mL via ORAL

## 2015-04-11 MED ORDER — PANTOPRAZOLE SODIUM 40 MG PO TBEC
40.0000 mg | DELAYED_RELEASE_TABLET | Freq: Every day | ORAL | Status: DC
  Administered 2015-04-11: 40 mg via ORAL
  Filled 2015-04-11: qty 1

## 2015-04-11 MED ORDER — SIMVASTATIN 20 MG PO TABS
20.0000 mg | ORAL_TABLET | Freq: Every day | ORAL | Status: DC
  Administered 2015-04-11: 20 mg via ORAL
  Filled 2015-04-11: qty 1

## 2015-04-11 MED ORDER — POTASSIUM CHLORIDE CRYS ER 20 MEQ PO TBCR
40.0000 meq | EXTENDED_RELEASE_TABLET | Freq: Once | ORAL | Status: AC
  Administered 2015-04-11: 40 meq via ORAL
  Filled 2015-04-11: qty 2

## 2015-04-11 MED ORDER — FLUTICASONE PROPIONATE 50 MCG/ACT NA SUSP: 1.0000 | Freq: Every day | NASAL | Status: DC

## 2015-04-11 MED ORDER — POTASSIUM CHLORIDE ER 20 MEQ PO TBCR: 40.0000 meq | EXTENDED_RELEASE_TABLET | Freq: Every day | ORAL | Status: DC

## 2015-04-11 MED ORDER — INSULIN ASPART 100 UNIT/ML ~~LOC~~ SOLN
0.0000 [IU] | Freq: Three times a day (TID) | SUBCUTANEOUS | Status: DC
  Administered 2015-04-11: 3 [IU] via SUBCUTANEOUS
  Filled 2015-04-11 (×2): qty 1

## 2015-04-11 MED ORDER — CHOLESTYRAMINE 4 G PO PACK
4.0000 g | PACK | Freq: Two times a day (BID) | ORAL | Status: DC
  Administered 2015-04-11: 4 g via ORAL
  Filled 2015-04-11 (×2): qty 1

## 2015-04-11 MED ORDER — LOSARTAN POTASSIUM 25 MG PO TABS: 25.0000 mg | ORAL_TABLET | Freq: Every day | ORAL | Status: AC

## 2015-04-11 MED ORDER — POTASSIUM CHLORIDE CRYS ER 20 MEQ PO TBCR
40.0000 meq | EXTENDED_RELEASE_TABLET | ORAL | Status: DC
  Administered 2015-04-11: 40 meq via ORAL
  Filled 2015-04-11: qty 2

## 2015-04-11 MED ORDER — ATENOLOL 50 MG PO TABS
50.0000 mg | ORAL_TABLET | Freq: Every day | ORAL | Status: DC
  Administered 2015-04-11: 50 mg via ORAL
  Filled 2015-04-11: qty 1

## 2015-04-11 MED ORDER — INSULIN ASPART PROT & ASPART (70-30 MIX) 100 UNIT/ML ~~LOC~~ SUSP: 7.0000 [IU] | Freq: Two times a day (BID) | SUBCUTANEOUS | Status: DC

## 2015-04-11 NOTE — ED Notes (Signed)
Report attempted 

## 2015-04-11 NOTE — ED Notes (Signed)
Pt is stable upon d/c and ambulates from ED. Pt to wait on family in the waiting room.

## 2015-04-11 NOTE — ED Notes (Signed)
CBG 140, Notified RN

## 2015-04-11 NOTE — ED Notes (Signed)
Jarrett Soho from social work at bedside to see about getting pt a taxi voucher home. Pt was resistant to being admitted and now says she is unable to leave until 6. Pt states that if she does get a ride home she has no house key. Social work states there isn't anything we can do. Pt has been encouraged to call her family to come and get her. Pt hasn't placed any calls at this time. Pt gave Jarrett Soho daughters work and cell number. Social work to continue to try and locate daughter to come and p/u pt.

## 2015-04-11 NOTE — H&P (Signed)
Date: 04/11/2015               Patient Name:  Stefanie Vincent MRN: [id number]  DOB: October 22, 1941 Age / Sex: 74 y.o., female   PCP: Provider Not In System         Medical Service: Internal Medicine Teaching Service         Attending Physician: Dr. Eppie Gibson    First Contact: Dr. Trudee Kuster Pager: [phone]  Second Contact: Dr. Naaman Plummer Pager: [phone]       After Hours (After 5p/  First Contact Pager: [phone]  weekends / holidays): Second Contact Pager: [phone]   Chief Complaint: rt foot pain, stomach pain, and dizziness  History of Present Illness: Pt is a 74 y/o F w/ PMHx of HTN, COPD, and DM2 who presents w/ dizziness, rt foot pain, and stomach pain. She has been feeling on/off for the past few days. This evening she was standing up when she developed pain in her right foot that shot up to her rt calf, she then felt dizzy and sat down on the bed. She also had one episode of vomiting that her daughter thought was blood but pt believes it was her candy that she ate. She does have a hx of stomach ulcers but denies taking any NSAIDs. Foot pain went away after 5 minutes and felt like the usual thigh cramps she gets due to her low potassium. Denies redness or swelling of her legs. She also states she had knots in he stomach from where she injects insulin. She normally has 3-4 BMs daily since having her gallbladder removed however the past 3 days she has been constipated. She finally had a BM today w/o much relieve in stomach pain. She states she feels bloated.   She was last admitted on 5/8 for COPD exacerbation. She states her breathing has progressively gotten worse over the years. States breathing has been stable since discharge.   Meds: Current Facility-Administered Medications  Medication Dose Route Frequency Provider Last Rate Last Dose  . 0.9 % NaCl with KCl 20 mEq/ L  infusion   Intravenous Once [name], MD 100 mL/hr at 04/11/15 0444     Current Outpatient Prescriptions  Medication  Sig Dispense Refill  . albuterol (PROVENTIL HFA;VENTOLIN HFA) 108 (90 BASE) MCG/ACT inhaler Inhale 2 puffs into the lungs every 6 (six) hours as needed for wheezing or shortness of breath (cough, shortness of breath or wheezing.). 1 Inhaler 1  . atenolol-chlorthalidone (TENORETIC) 50-25 MG per tablet Take 1 tablet by mouth daily.    . insulin aspart protamine- aspart (NOVOLOG MIX 70/30) (70-30) 100 UNIT/ML injection Inject 0.1 mLs (10 Units total) into the skin 2 (two) times daily with a meal. 10 mL 11  . loratadine (CLARITIN) 10 MG tablet Take 10 mg by mouth daily.     Marland Kitchen omeprazole (PRILOSEC) 20 MG capsule Take 20 mg by mouth daily as needed (allergies).    Marland Kitchen rOPINIRole (REQUIP) 0.5 MG tablet Take 0.5 mg by mouth at bedtime as needed (FOR RESTLESS LEGS).    Marland Kitchen simvastatin (ZOCOR) 20 MG tablet Take 20 mg by mouth daily.    Marland Kitchen tiotropium (SPIRIVA) 18 MCG inhalation capsule Place 1 capsule (18 mcg total) into inhaler and inhale daily. 30 capsule 2  . vitamin D, CHOLECALCIFEROL, 400 UNITS tablet Take 400 Units by mouth daily.    Marland Kitchen atenolol (TENORMIN) 50 MG tablet Take 1 tablet (50 mg total) by mouth daily. (Patient not taking: Reported on 04/11/2015)  30 tablet 0  . losartan (COZAAR) 25 MG tablet Take 1 tablet (25 mg total) by mouth daily. (Patient not taking: Reported on 04/11/2015) 30 tablet 0    Allergies: Allergies as of 04/10/2015 - Review Complete 04/10/2015  Allergen Reaction Noted  . Codeine  03/12/2015   Past Medical History  Diagnosis Date  . Arthritis   . Blood transfusion without reported diagnosis   . Hypertension   . Diabetes mellitus without complication    Past Surgical History  Procedure Laterality Date  . Cholecystectomy     Family History  Problem Relation Age of Onset  . Stroke Mother   . Cancer Father    History   Social History  . Marital Status: Widowed    Spouse Name: N/A  . Number of Children: N/A  . Years of Education: N/A   Occupational History  . Not on  file.   Social History Main Topics  . Smoking status: Current Every Day Smoker -- 1.00 packs/day for 60 years    Types: Cigarettes  . Smokeless tobacco: Never Used     Comment: Has cut back lately.  . Alcohol Use: No  . Drug Use: No  . Sexual Activity: Not on file   Other Topics Concern  . Not on file   Social History Narrative    Review of Systems  Constitutional: Positive for chills and weight loss. Negative for fever.  Respiratory: Positive for shortness of breath (stable).   Cardiovascular: Negative for chest pain and leg swelling.  Gastrointestinal: Positive for nausea, vomiting, abdominal pain and constipation. Negative for diarrhea and blood in stool.  Genitourinary: Negative for dysuria.  Neurological: Negative for weakness.   Physical Exam: Blood pressure 125/31, pulse 56, temperature 97.7 F (36.5 C), temperature source Oral, resp. rate 13, SpO2 100 %. Physical Exam  Constitutional: She appears well-developed and well-nourished. No distress.  HENT:  Head: Normocephalic.  Mouth/Throat: Oropharynx is clear and moist.  Eyes: Conjunctivae are normal.  Cardiovascular: Regular rhythm.  Bradycardia present.   No murmur heard. Pulmonary/Chest: Effort normal and breath sounds normal. No respiratory distress. She has no wheezes.  Abdominal: Soft. Bowel sounds are normal. She exhibits no distension and no mass. There is tenderness (generalized).  Musculoskeletal: She exhibits no edema or tenderness.  Neurological:  5/5 UE and LE strength   Skin: Skin is warm and dry.   Lab results: Basic Metabolic Panel:  Recent Labs  04/11/15 0009  NA 131*  K 2.5*  CL 95*  CO2 26  GLUCOSE 117*  BUN 14  CREATININE 1.00  CALCIUM 8.5*   Liver Function Tests:  Recent Labs  04/11/15 0009  AST 22  ALT 22  ALKPHOS 75  BILITOT 0.4  PROT 6.3*  ALBUMIN 3.6   CBC:  Recent Labs  04/11/15 0009  WBC 17.7*  NEUTROABS 13.8*  HGB 13.0  HCT 37.2  MCV 86.1  PLT 283     Urinalysis:  Recent Labs  04/11/15 0210  COLORURINE YELLOW  LABSPEC 1.009  PHURINE 6.5  GLUCOSEU NEGATIVE  HGBUR NEGATIVE  BILIRUBINUR NEGATIVE  KETONESUR NEGATIVE  PROTEINUR NEGATIVE  UROBILINOGEN 0.2  NITRITE NEGATIVE  LEUKOCYTESUR MODERATE*    Imaging results:  Ct Abdomen Pelvis W Contrast  04/11/2015   CLINICAL DATA:  Nausea, vomiting and bloating.  Constipation.  EXAM: CT ABDOMEN AND PELVIS WITH CONTRAST  TECHNIQUE: Multidetector CT imaging of the abdomen and pelvis was performed using the standard protocol following bolus administration of intravenous contrast.  CONTRAST:  19mL OMNIPAQUE IOHEXOL 300 MG/ML  SOLN  COMPARISON:  Radiographs earlier this day.  FINDINGS: No acute pleural parenchymal abnormality. Vascular calcifications are seen.  Clips in the gallbladder fossa from cholecystectomy. Minimal expected biliary prominence. No calcified choledocholithiasis. There is a tiny 3 mm hypodensity in the left lobe of the liver, too small to characterize. The spleen and adrenal glands are normal. Multiple calcifications about the pancreatic tail without ductal dilatation. Pancreatic atrophy.  Kidneys demonstrate symmetric enhancement and excretion. There is a 1.4 cm cyst in the interpolar left kidney.  Stomach is physiologically distended with contrast. There are no dilated or thickened bowel loops. The appendix is normal. There is small volume of stool in the ascending colon, with liquid stool at the hepatic and splenic flexures and transverse colon. Small volume of stool and distal colon. No colonic wall thickening. No free air, free fluid, or intra-abdominal fluid collection.  No retroperitoneal adenopathy. Abdominal aorta is normal in caliber. Dense atherosclerosis without aneurysm.  Within the pelvis the bladder is distended. The uterus is atrophic, normal for age. The ovaries are atrophic, normal for age. There is a peripherally calcified density in the right ovary. No pelvic free  fluid. No pelvic adenopathy.  Scattered soft tissue stranding in the anterior abdominal wall in a pattern suggestive of injection sites.  Multilevel degenerative disc disease and facet arthropathy throughout the lumbar spine. There are no acute or suspicious osseous abnormalities. Diffuse bony under mineralization.  IMPRESSION: 1. Fluid-filled transverse colon without associated colonic wall thickening. Findings may reflect a diarrheal illness. 2. There is otherwise no acute abnormality in the abdomen/pelvis. Pancreatic calcifications suggesting sequela of prior pancreatitis. Diffuse atherosclerosis. Postcholecystectomy without disproportionate biliary dilatation.   Electronically Signed   By: Jeb Levering M.D.   On: 04/11/2015 02:58   Dg Abd Acute W/chest  04/11/2015   CLINICAL DATA:  Mid abdominal pain with bloating today.  EXAM: DG ABDOMEN ACUTE W/ 1V CHEST  COMPARISON:  Chest radiograph 03/12/2015  FINDINGS: The cardiomediastinal contours are normal. The lungs are clear. There is no free intra-abdominal air. Scattered air-fluid levels throughout bowel loops in the upper abdomen, likely within colon. Surgical clips in the right upper quadrant of the abdomen likely from appendectomy. Pelvic phleboliths and vascular calcifications are seen. Small volume of stool throughout the colon. No radiopaque calculi. No acute osseous abnormalities are seen. There is mild scoliotic curvature and degenerative change in the lumbar spine.  IMPRESSION: 1. Scattered air-fluid levels throughout bowel in the upper abdomen, likely colon. This can be seen in the setting of diarrhea/liquid stool or mild colitis. No free intra-abdominal air. 2. No acute pulmonary process.   Electronically Signed   By: Jeb Levering M.D.   On: 04/11/2015 00:57    Other results: EKG: unchanged from previous tracings, sinus bradycardia.  Assessment & Plan by Problem: Principal Problem:   Hypokalemia Active Problems:   Type 2 diabetes  mellitus   Essential hypertension   COPD exacerbation   Hyponatremia   Near syncope   Abdominal pain   Hypokalemia-- pt has hx of hypokalemia previously on K+ supplements that were stopped on discharge from hospital last month. Likely has hypokalemic due to chronic diarrhea s/p cholecystectomy. On presentation K 2.8 likely causing her foot cramp she experienced this evening, n/v, and abd distention 2/2 ileus. She was given 45mEq IV potassium w/ 2g of mag and started on NS w/ potassium.  - admit to tele - KDur 40 mEq x 2 doses - BMET  in the am  Hyponatremia-- pt had hyponatremia on previous admission during which chlorthalidone. Also could be due to her chronic diarrhea.TSH WNL on 03/13/15, Na improved since last admission, now 131. Likely with discontinuing chlorthalidone Na is gradually normalizing.  Concern of SIADH in setting of lung CA given 60 pack year smoking hx. She has not gotten a CT of chest. - will need CT of chest to look for possible malignancy - will cont to monitor  Dizziness-- Could be due to orthostatic hypotension as EMS noted pt was orthostatic on arrival and gave pt 600cc NS bolus with improvement of orthostatic vitals in the ED. Could also be hypoglycemia as she has not been taking insulin as directed and takes 10-30 units of 70/30 based on how she feels. However glucose was WNL on admission. Could also be vaso vagal as she vomited during dizziness episode. No longer feeling dizzy.  - repeat orthostatic vitals - cont IVF - PT/OT  Abdominal pain--Likely due to constipation x 3 days as she normally has issues with diarrhea. Denies radiation of abd pain to back. CT of abd/pelvis reveals fluid filled transverse colon w/o associated colonic wall thickening that may reflect diarrheal illness. Xray of the abdomen consistent with CT findings. LA 1.47. She has a leukocytosis ( 17.7) and is afebrile. Also she has pancreatic calcifications and pancreatic atrophy on CT abd thus possible  that chronic pancreatitis contributing to abd pain. No colonoscopy on file.  - FOBT - trop x 1  Asymptomatic bacturia-- moderate LE, neg nitraites, 7-10 WBCs on UA. Denies dysuria, neg for CVA tenderness on PE. - will continue to monitor and tx if symptomatic  COPD - cont home albuterol inhaler and spiriva  DM2: on 70/30 10 units BID at home - SSI - 70/30 7 units BID  HTN - hold home atenolol 50mg  due to low normal BP - hold losartan 25mg  as pt just received IV contrast  HLD - cont zocor  Allergies--pt feel her home loratidine is not working for her - flonase  Diet-- HH/carb mod  DVT ppx-- lovenox  CODE: FULL- confirmed w/ pt at bedside  Dispo: Disposition is deferred at this time, awaiting improvement of current medical problems.   The patient does have a current PCP (Provider Not In System) and does need an [facility] hospital follow-up appointment after discharge.  The patient does not have transportation limitations that hinder transportation to clinic appointments.  Signed: Norman Herrlich, MD 04/11/2015, 6:01 AM

## 2015-04-11 NOTE — Progress Notes (Signed)
Subjective:    Currently, the patient reports significant improvement in her abdominal pain, fatigue, lightheadedness, and foot cramping. She reports tolerating breakfast without any issues this morning, and she is excited about going home today.  Interval Events: -Patient received 20 mEq of potassium in her IV fluids, along with 2 doses of K Dur 40 mEq this morning.  -Vital signs stable overnight.    Objective:    Vital Signs:   Temp:  [97.7 F (36.5 C)] 97.7 F (36.5 C) (06/07 0005) Pulse Rate:  [54-69] 63 (06/07 1000) Resp:  [13-20] 17 (06/07 1000) BP: (121-164)/(31-90) 146/60 mmHg (06/07 1000) SpO2:  [95 %-100 %] 99 % (06/07 1000)    24-hour weight change: Weight change:   Intake/Output:  No intake or output data in the 24 hours ending 04/11/15 1059    Physical Exam: General: Well-developed, well-nourished, in no acute distress; alert, appropriate and cooperative throughout examination.  Lungs:  Normal respiratory effort. Clear to auscultation BL without crackles or wheezes.  Heart: RRR. S1 and S2 normal without gallop, murmur, or rubs.  Abdomen:  BS normoactive. Soft, Nondistended, non-tender.  No masses or organomegaly.  Extremities: No pretibial edema.     Labs:  Basic Metabolic Panel:  Recent Labs Lab 04/11/15 0009 04/11/15 0742  NA 131*  --   K 2.5*  --   CL 95*  --   CO2 26  --   GLUCOSE 117*  --   BUN 14  --   CREATININE 1.00  --   CALCIUM 8.5*  --   MG  --  1.9    Liver Function Tests:  Recent Labs Lab 04/11/15 0009  AST 22  ALT 22  ALKPHOS 75  BILITOT 0.4  PROT 6.3*  ALBUMIN 3.6   CBC:  Recent Labs Lab 04/11/15 0009  WBC 17.7*  NEUTROABS 13.8*  HGB 13.0  HCT 37.2  MCV 86.1  PLT 283    Cardiac Enzymes:  Recent Labs Lab 04/11/15 0742  TROPONINI <0.03   CBG:  Recent Labs Lab 04/11/15 0832  GLUCAP 140*   Other results: EKG: sinus bradycardia.  Imaging: Ct Abdomen Pelvis W Contrast  04/11/2015   CLINICAL  DATA:  Nausea, vomiting and bloating.  Constipation.  EXAM: CT ABDOMEN AND PELVIS WITH CONTRAST  TECHNIQUE: Multidetector CT imaging of the abdomen and pelvis was performed using the standard protocol following bolus administration of intravenous contrast.  CONTRAST:  152mL OMNIPAQUE IOHEXOL 300 MG/ML  SOLN  COMPARISON:  Radiographs earlier this day.  FINDINGS: No acute pleural parenchymal abnormality. Vascular calcifications are seen.  Clips in the gallbladder fossa from cholecystectomy. Minimal expected biliary prominence. No calcified choledocholithiasis. There is a tiny 3 mm hypodensity in the left lobe of the liver, too small to characterize. The spleen and adrenal glands are normal. Multiple calcifications about the pancreatic tail without ductal dilatation. Pancreatic atrophy.  Kidneys demonstrate symmetric enhancement and excretion. There is a 1.4 cm cyst in the interpolar left kidney.  Stomach is physiologically distended with contrast. There are no dilated or thickened bowel loops. The appendix is normal. There is small volume of stool in the ascending colon, with liquid stool at the hepatic and splenic flexures and transverse colon. Small volume of stool and distal colon. No colonic wall thickening. No free air, free fluid, or intra-abdominal fluid collection.  No retroperitoneal adenopathy. Abdominal aorta is normal in caliber. Dense atherosclerosis without aneurysm.  Within the pelvis the bladder is distended. The uterus is atrophic, normal  for age. The ovaries are atrophic, normal for age. There is a peripherally calcified density in the right ovary. No pelvic free fluid. No pelvic adenopathy.  Scattered soft tissue stranding in the anterior abdominal wall in a pattern suggestive of injection sites.  Multilevel degenerative disc disease and facet arthropathy throughout the lumbar spine. There are no acute or suspicious osseous abnormalities. Diffuse bony under mineralization.  IMPRESSION: 1.  Fluid-filled transverse colon without associated colonic wall thickening. Findings may reflect a diarrheal illness. 2. There is otherwise no acute abnormality in the abdomen/pelvis. Pancreatic calcifications suggesting sequela of prior pancreatitis. Diffuse atherosclerosis. Postcholecystectomy without disproportionate biliary dilatation.   Electronically Signed   By: Jeb Levering M.D.   On: 04/11/2015 02:58   Dg Abd Acute W/chest  04/11/2015   CLINICAL DATA:  Mid abdominal pain with bloating today.  EXAM: DG ABDOMEN ACUTE W/ 1V CHEST  COMPARISON:  Chest radiograph 03/12/2015  FINDINGS: The cardiomediastinal contours are normal. The lungs are clear. There is no free intra-abdominal air. Scattered air-fluid levels throughout bowel loops in the upper abdomen, likely within colon. Surgical clips in the right upper quadrant of the abdomen likely from appendectomy. Pelvic phleboliths and vascular calcifications are seen. Small volume of stool throughout the colon. No radiopaque calculi. No acute osseous abnormalities are seen. There is mild scoliotic curvature and degenerative change in the lumbar spine.  IMPRESSION: 1. Scattered air-fluid levels throughout bowel in the upper abdomen, likely colon. This can be seen in the setting of diarrhea/liquid stool or mild colitis. No free intra-abdominal air. 2. No acute pulmonary process.   Electronically Signed   By: Jeb Levering M.D.   On: 04/11/2015 00:57       Medications:    Infusions: . 0.9 % NaCl with KCl 20 mEq / L 100 mL/hr at 04/11/15 0444    Scheduled Medications: . cholestyramine  4 g Oral BID  . enoxaparin (LOVENOX) injection  40 mg Subcutaneous Q24H  . fluticasone  1 spray Each Nare Daily  . insulin aspart  0-9 Units Subcutaneous TID WC  . insulin aspart protamine- aspart  7 Units Subcutaneous BID WC  . pantoprazole  40 mg Oral Daily  . potassium chloride  40 mEq Oral Once  . simvastatin  20 mg Oral Daily  . sulfamethoxazole-trimethoprim   1 tablet Oral Q12H  . tiotropium  18 mcg Inhalation Daily    PRN Medications: acetaminophen **OR** acetaminophen, albuterol   Assessment/ Plan:    Principal Problem:   Hypokalemia Active Problems:   Type 2 diabetes mellitus   Essential hypertension   COPD exacerbation   Hyponatremia   Near syncope   Abdominal pain  #Hypokalemia The patient's abdominal pain, cramping, and energy have improved after repleting her potassium. She is likely chronically hypokalemic due to her chronic diarrhea. She'll need to continue potassium supplementation despite being off of chlorthalidone. -Recheck potassium at 1100. -We'll discharge home on KDur 40 mEq daily. -Treat diarrhea as below. -Continue heart healthy/carb modified diet. -Arrange follow-up in the [facility] clinic for monitoring of her potassium.  #Chronic diarrhea This appears to have started after her cholecystectomy, suggesting increased secretion about acids may be the cause. She also has some imaging findings consistent with chronic pancreatitis, raising the possibility of pancreatic insufficiency. -Start cholestyramine 4 mg twice a day. -Consider checking stool elastase as an outpatient.  #Urinary tract infection The patient reported some dizziness, and she was found to be orthostatic by EMS. She has improved with IV hydration. She  denies dysuria, but it is possible that her abdominal pain, fatigue, and dizziness may be symptoms related to a UTI. In addition, this could explain her leukocytosis. -Bactrim DS twice a day for 3 days.  #Hyponatremia This is improved since stopping chlorthalidone, suggesting that it was secondary to diuretic use. No evidence of mass on chest x-ray, so we will not pursue CT imaging at this time. Given her smoking history, she would qualify for low-dose CT screening for lung cancer. However, this can be pursued as an outpatient. -Continue to hold chlorthalidone.  #COPD -Continue home albuterol and  Spiriva.  #Type 2 diabetes -Continue sliding scale insulin. -Continue NovoLog 70/30 7 units twice a day. -Resume home insulin regimen at discharge.  #Hypertension Blood pressure slightly elevated, but the patient is off her home medications. -Restart home atenolol 50 mg daily. -Restart home losartan 25 mg daily at discharge.  #Allergies -Continue Flonase.  #Hyperlipidemia -Continue home Zocor.   DVT PPX - low molecular weight heparin  CODE STATUS - Full  CONSULTS PLACED - GI  DISPO - Discharge home today after potassium repleted.   The patient does not have a current PCP (PROVIDER NOT IN SYSTEM) and does need an [facility] hospital follow-up appointment after discharge.    Is the [facility] hospital follow-up appointment a one-time only appointment? yes.  Does the patient have transportation limitations that hinder transportation to clinic appointments? yes   SERVICE NEEDED AT [place]         Y = Yes, Blank = No PT:   OT:   RN:   Equipment:   Other:      Length of Stay:  day(s)   Signed: Charlesetta Shanks, MD  PGY-1, Internal Medicine Resident Pager: [phone] (7AM-5PM) 04/11/2015, 10:59 AM

## 2015-04-11 NOTE — ED Notes (Signed)
Pt presents to ED via EMS with c/o feeling distended. Pt reports she was lying down and felt sudden pain running down her right leg. Then noticed that her abdomen is distended and bruise/admits to insulin injection on abdomen. Positive for orthostatic per EMS-given 600mg  normal saline. Pt alerts and oriented x4 at this time, airway intact.

## 2015-04-11 NOTE — Discharge Instructions (Addendum)
·   Thank you for allowing [place] to be involved in your healthcare while you were hospitalized at [facility].   Please note that there have been changes to your home medications.  --> PLEASE LOOK AT YOUR DISCHARGE MEDICATION LIST FOR DETAILS.   Please call your PCP if you have any questions or concerns, or any difficulty getting any of your medications.  Please return to the ER if you have worsening of your symptoms or new severe symptoms arise.  We have started you back on potassium supplementation. You will need to take 1 tablet every day.  Make sure to follow up in our clinic, so we can recheck your potassium level.  We have decided to treat you for a urinary tract infection, which may have been contributing to your abdominal pain and lightheadedness. You will need to complete 3 days of antibiotics.  We have started you on a medication called cholestyramine that may help with your diarrhea.  Resume your losartan tomorrow.

## 2015-04-11 NOTE — ED Notes (Signed)
Admitting at bedside 

## 2015-04-11 NOTE — Discharge Summary (Signed)
Name: Stefanie Vincent MRN: [id number] DOB: 09/17/1941 74 y.o. PCP: Provider Not In System  Date of Admission: 04/10/2015 11:25 PM Date of Discharge: 04/11/2015 Attending Physician: Oval Linsey, MD  Discharge Diagnosis: Principal Problem:   Hypokalemia Active Problems:   Type 2 diabetes mellitus   Essential hypertension   COPD exacerbation   Hyponatremia   Near syncope   Abdominal pain  Discharge Medications:   Medication List    STOP taking these medications        atenolol-chlorthalidone 50-25 MG per tablet  Commonly known as:  TENORETIC      TAKE these medications        albuterol 108 (90 BASE) MCG/ACT inhaler  Commonly known as:  PROVENTIL HFA;VENTOLIN HFA  Inhale 2 puffs into the lungs every 6 (six) hours as needed for wheezing or shortness of breath (cough, shortness of breath or wheezing.).     atenolol 50 MG tablet  Commonly known as:  TENORMIN  Take 1 tablet (50 mg total) by mouth daily.     cholestyramine 4 G packet  Commonly known as:  QUESTRAN  Take 1 packet (4 g total) by mouth 2 (two) times daily.     fluticasone 50 MCG/ACT nasal spray  Commonly known as:  FLONASE  Place 1 spray into both nostrils daily.     insulin aspart protamine- aspart (70-30) 100 UNIT/ML injection  Commonly known as:  NOVOLOG MIX 70/30  Inject 0.1 mLs (10 Units total) into the skin 2 (two) times daily with a meal.     loratadine 10 MG tablet  Commonly known as:  CLARITIN  Take 10 mg by mouth daily.     losartan 25 MG tablet  Commonly known as:  COZAAR  Take 1 tablet (25 mg total) by mouth daily.     omeprazole 20 MG capsule  Commonly known as:  PRILOSEC  Take 20 mg by mouth daily as needed (allergies).     Potassium Chloride ER 20 MEQ Tbcr  Take 40 mEq by mouth daily.     rOPINIRole 0.5 MG tablet  Commonly known as:  REQUIP  Take 0.5 mg by mouth at bedtime as needed (FOR RESTLESS LEGS).     simvastatin 20 MG tablet  Commonly known as:  ZOCOR  Take 20 mg by  mouth daily.     sulfamethoxazole-trimethoprim 800-160 MG per tablet  Commonly known as:  BACTRIM DS,SEPTRA DS  Take 1 tablet by mouth every 12 (twelve) hours.     tiotropium 18 MCG inhalation capsule  Commonly known as:  SPIRIVA  Place 1 capsule (18 mcg total) into inhaler and inhale daily.     vitamin D (CHOLECALCIFEROL) 400 UNITS tablet  Take 400 Units by mouth daily.        Disposition and follow-up:   Stefanie Vincent was discharged from [facility] in Good condition.  At the hospital follow up visit please address:  1.  Potassium supplementation needs, completion of antibiotics, ensure patient receives low-dose CT for lung cancer screening.  2.  Labs / imaging needed at time of follow-up: BMP.  3.  Pending labs/ test needing follow-up: None.  Follow-up Appointments:     Follow-up Information    Follow up with Jessee Avers, MD On 04/18/2015.   Specialty:  Internal Medicine   Why:  3:15 pm   Contact information:   [place] [address] [phone]       Discharge Instructions: Discharge Instructions  Call MD for:  difficulty breathing, headache or visual disturbances    Complete by:  As directed      Call MD for:  extreme fatigue    Complete by:  As directed      Call MD for:  hives    Complete by:  As directed      Call MD for:  persistant dizziness or light-headedness    Complete by:  As directed      Call MD for:  persistant nausea and vomiting    Complete by:  As directed      Call MD for:  severe uncontrolled pain    Complete by:  As directed      Call MD for:  temperature >100.4    Complete by:  As directed      Diet - low sodium heart healthy    Complete by:  As directed      Diet Carb Modified    Complete by:  As directed      Increase activity slowly    Complete by:  As directed           Thank you for allowing [place] to be involved in your healthcare while you were hospitalized at [facility].   Please note that there have been changes to your home medications.  --> PLEASE LOOK AT YOUR DISCHARGE MEDICATION LIST FOR DETAILS.   Please call your PCP if you have any questions or concerns, or any difficulty getting any of your medications.  Please return to the ER if you have worsening of your symptoms or new severe symptoms arise.  We have started you back on potassium supplementation. You will need to take 1 tablet every day.  Make sure to follow up in our clinic, so we can recheck your potassium level.  We have decided to treat you for a urinary tract infection, which may have been contributing to your abdominal pain and lightheadedness. You will need to complete 3 days of antibiotics.  We have started you on a medication called cholestyramine that may help with your diarrhea.  Resume your losartan tomorrow.  Consultations: None.  Procedures Performed:  Dg Chest 2 View  03/12/2015   CLINICAL DATA:  Weakness.  Productive cough.  EXAM: CHEST  2 VIEW  COMPARISON:  None.  FINDINGS: Normal sized heart. Clear lungs. Minimal diffuse peribronchial thickening and accentuation of the interstitial markings. Mild scoliosis. Upper lumbar spine degenerative changes. Cholecystectomy clips.  IMPRESSION: Minimal bronchitic changes.   Electronically Signed   By: Claudie Revering M.D.   On: 03/12/2015 13:29   Ct Abdomen Pelvis W Contrast  04/11/2015   CLINICAL DATA:  Nausea, vomiting and bloating.  Constipation.  EXAM: CT ABDOMEN AND PELVIS WITH CONTRAST  TECHNIQUE: Multidetector CT imaging of the abdomen and pelvis was performed using the standard protocol following bolus administration of intravenous contrast.  CONTRAST:  127mL OMNIPAQUE IOHEXOL 300 MG/ML  SOLN  COMPARISON:  Radiographs earlier this day.  FINDINGS: No acute pleural parenchymal abnormality. Vascular calcifications are seen.  Clips in the gallbladder fossa from cholecystectomy. Minimal expected biliary prominence. No calcified  choledocholithiasis. There is a tiny 3 mm hypodensity in the left lobe of the liver, too small to characterize. The spleen and adrenal glands are normal. Multiple calcifications about the pancreatic tail without ductal dilatation. Pancreatic atrophy.  Kidneys demonstrate symmetric enhancement and excretion. There is a 1.4 cm cyst in the interpolar left kidney.  Stomach is physiologically distended  with contrast. There are no dilated or thickened bowel loops. The appendix is normal. There is small volume of stool in the ascending colon, with liquid stool at the hepatic and splenic flexures and transverse colon. Small volume of stool and distal colon. No colonic wall thickening. No free air, free fluid, or intra-abdominal fluid collection.  No retroperitoneal adenopathy. Abdominal aorta is normal in caliber. Dense atherosclerosis without aneurysm.  Within the pelvis the bladder is distended. The uterus is atrophic, normal for age. The ovaries are atrophic, normal for age. There is a peripherally calcified density in the right ovary. No pelvic free fluid. No pelvic adenopathy.  Scattered soft tissue stranding in the anterior abdominal wall in a pattern suggestive of injection sites.  Multilevel degenerative disc disease and facet arthropathy throughout the lumbar spine. There are no acute or suspicious osseous abnormalities. Diffuse bony under mineralization.  IMPRESSION: 1. Fluid-filled transverse colon without associated colonic wall thickening. Findings may reflect a diarrheal illness. 2. There is otherwise no acute abnormality in the abdomen/pelvis. Pancreatic calcifications suggesting sequela of prior pancreatitis. Diffuse atherosclerosis. Postcholecystectomy without disproportionate biliary dilatation.   Electronically Signed   By: Jeb Levering M.D.   On: 04/11/2015 02:58   Dg Abd Acute W/chest  04/11/2015   CLINICAL DATA:  Mid abdominal pain with bloating today.  EXAM: DG ABDOMEN ACUTE W/ 1V CHEST   COMPARISON:  Chest radiograph 03/12/2015  FINDINGS: The cardiomediastinal contours are normal. The lungs are clear. There is no free intra-abdominal air. Scattered air-fluid levels throughout bowel loops in the upper abdomen, likely within colon. Surgical clips in the right upper quadrant of the abdomen likely from appendectomy. Pelvic phleboliths and vascular calcifications are seen. Small volume of stool throughout the colon. No radiopaque calculi. No acute osseous abnormalities are seen. There is mild scoliotic curvature and degenerative change in the lumbar spine.  IMPRESSION: 1. Scattered air-fluid levels throughout bowel in the upper abdomen, likely colon. This can be seen in the setting of diarrhea/liquid stool or mild colitis. No free intra-abdominal air. 2. No acute pulmonary process.   Electronically Signed   By: Jeb Levering M.D.   On: 04/11/2015 00:57   Admission HPI:  Pt is a 74 y/o F w/ PMHx of HTN, COPD, and DM2 who presents w/ dizziness, rt foot pain, and stomach pain. She has been feeling on/off for the past few days. This evening she was standing up when she developed pain in her right foot that shot up to her rt calf, she then felt dizzy and sat down on the bed. She also had one episode of vomiting that her daughter thought was blood but pt believes it was her candy that she ate. She does have a hx of stomach ulcers but denies taking any NSAIDs. Foot pain went away after 5 minutes and felt like the usual thigh cramps she gets due to her low potassium. Denies redness or swelling of her legs. She also states she had knots in he stomach from where she injects insulin. She normally has 3-4 BMs daily since having her gallbladder removed however the past 3 days she has been constipated. She finally had a BM today w/o much relieve in stomach pain. She states she feels bloated.   She was last admitted on 5/8 for COPD exacerbation. She states her breathing has progressively gotten worse over the  years. States breathing has been stable since discharge.   Hospital Course by problem list: Principal Problem:   Hypokalemia Active Problems:  Type 2 diabetes mellitus   Essential hypertension   COPD exacerbation   Hyponatremia   Near syncope   Abdominal pain   #Hypokalemia The patient had recently been taken off of her home potassium supplementation after stopping chlorthalidone during a recent hospitalization for a COPD exacerbation. She reported cramping and fatigue associated with abdominal pain on presentation. Her potassium was noted to be 2.5, and she was supplemented with IV and by mouth potassium with improvement of her potassium to 3.4 and resolution of her symptoms. She was discharged home on KDur 40 mEq daily. A follow-up, please recheck her potassium and determine if her dose of potassium supplementation needs to be modified.  #Chronic diarrhea The patient reported chronic diarrhea since her cholecystectomy several years ago, which is likely the cause of her chronic hypokalemia. As a result, she was started on cholestyramine 4 mg twice a day in case her diarrhea is secondary to excess bile salt excretion. In addition, CT abdomen demonstrated pancreatic calcifications, consistent with prior pancreatitis. At follow-up, consider checking a fecal elastase to rule out pancreatic insufficiency as the cause of her diarrhea.  #Urinary tract infection The patient denied dysuria, but she reported some dizziness and was orthostatic upon EMS arrival, which improved with IV hydration. She was noted to have a leukocytosis of 17.7. Urinalysis showed 7-10 white blood cells, concerning for urinary tract infection. The patient was treated with Bactrim DS twice a day for 3 days.  #Tobacco abuse The patient has an extensive smoking history of over a pack per day for the last 60 years. She would benefit from lung cancer screening by low-dose CT, and this was ordered during her previous hospital  follow-up. At follow-up, check on the preauthorization of this study and help the patient schedule this scan.  #Hyponatremia The patient was hyponatremic to 126 during her recent hospitalization, likely due to chlorthalidone use. This was discontinued, and her sodium was noted to be improved during this admission to 131.  #Hypertension The patient was initially normotensive in the ER. The following morning, her blood pressure was elevated, so her home atenolol 50 mg daily was restarted. She was told to resume her home losartan 25 mg daily the day after discharge given the IV contrast she received for her abdominal CT.  #Allergies The patient reported that her home loratadine was not completely working for her allergies. She was started on Flonase, and she was given a prescription for this at discharge.  Discharge Vitals:   BP 141/55 mmHg  Pulse 70  Temp(Src) 97.7 F (36.5 C) (Oral)  Resp 16  SpO2 96%  Discharge Labs:  Results for orders placed or performed during the hospital encounter of 04/10/15 (from the past 24 hour(s))  CBC with Differential     Status: Abnormal   Collection Time: 04/11/15 12:09 AM  Result Value Ref Range   WBC 17.7 (H) 4.0 - 10.5 K/uL   RBC 4.32 3.87 - 5.11 MIL/uL   Hemoglobin 13.0 12.0 - 15.0 g/dL   HCT 37.2 36.0 - 46.0 %   MCV 86.1 78.0 - 100.0 fL   MCH 30.1 26.0 - 34.0 pg   MCHC 34.9 30.0 - 36.0 g/dL   RDW 13.6 11.5 - 15.5 %   Platelets 283 150 - 400 K/uL   Neutrophils Relative % 79 (H) 43 - 77 %   Neutro Abs 13.8 (H) 1.7 - 7.7 K/uL   Lymphocytes Relative 15 12 - 46 %   Lymphs Abs 2.7 0.7 - 4.0  K/uL   Monocytes Relative 5 3 - 12 %   Monocytes Absolute 0.9 0.1 - 1.0 K/uL   Eosinophils Relative 1 0 - 5 %   Eosinophils Absolute 0.3 0.0 - 0.7 K/uL   Basophils Relative 0 0 - 1 %   Basophils Absolute 0.0 0.0 - 0.1 K/uL  Comprehensive metabolic panel     Status: Abnormal   Collection Time: 04/11/15 12:09 AM  Result Value Ref Range   Sodium 131 (L) 135  - 145 mmol/L   Potassium 2.5 (LL) 3.5 - 5.1 mmol/L   Chloride 95 (L) 101 - 111 mmol/L   CO2 26 22 - 32 mmol/L   Glucose, Bld 117 (H) 65 - 99 mg/dL   BUN 14 6 - 20 mg/dL   Creatinine, Ser 1.00 0.44 - 1.00 mg/dL   Calcium 8.5 (L) 8.9 - 10.3 mg/dL   Total Protein 6.3 (L) 6.5 - 8.1 g/dL   Albumin 3.6 3.5 - 5.0 g/dL   AST 22 15 - 41 U/L   ALT 22 14 - 54 U/L   Alkaline Phosphatase 75 38 - 126 U/L   Total Bilirubin 0.4 0.3 - 1.2 mg/dL   GFR calc non Af Amer 54 (L) >60 mL/min   GFR calc Af Amer >60 >60 mL/min   Anion gap 10 5 - 15  I-Stat Troponin, ED (not at [facility])     Status: None   Collection Time: 04/11/15 12:42 AM  Result Value Ref Range   Troponin i, poc 0.00 0.00 - 0.08 ng/mL   Comment 3          I-Stat CG4 Lactic Acid, ED     Status: None   Collection Time: 04/11/15 12:44 AM  Result Value Ref Range   Lactic Acid, Venous 1.47 0.5 - 2.0 mmol/L  Urinalysis, Routine w reflex microscopic (not at [facility])     Status: Abnormal   Collection Time: 04/11/15  2:10 AM  Result Value Ref Range   Color, Urine YELLOW YELLOW   APPearance CLEAR CLEAR   Specific Gravity, Urine 1.009 1.005 - 1.030   pH 6.5 5.0 - 8.0   Glucose, UA NEGATIVE NEGATIVE mg/dL   Hgb urine dipstick NEGATIVE NEGATIVE   Bilirubin Urine NEGATIVE NEGATIVE   Ketones, ur NEGATIVE NEGATIVE mg/dL   Protein, ur NEGATIVE NEGATIVE mg/dL   Urobilinogen, UA 0.2 0.0 - 1.0 mg/dL   Nitrite NEGATIVE NEGATIVE   Leukocytes, UA MODERATE (A) NEGATIVE  Urine microscopic-add on     Status: None   Collection Time: 04/11/15  2:10 AM  Result Value Ref Range   Squamous Epithelial / LPF RARE RARE   WBC, UA 7-10 <3 WBC/hpf   RBC / HPF 0-2 <3 RBC/hpf   Bacteria, UA RARE RARE  Magnesium     Status: None   Collection Time: 04/11/15  7:42 AM  Result Value Ref Range   Magnesium 1.9 1.7 - 2.4 mg/dL  Troponin I     Status: None   Collection Time: 04/11/15  7:42 AM  Result Value Ref Range   Troponin I <0.03 <0.031 ng/mL  CBG monitoring, ED      Status: Abnormal   Collection Time: 04/11/15  8:32 AM  Result Value Ref Range   Glucose-Capillary 140 (H) 65 - 99 mg/dL  CBG monitoring, ED     Status: Abnormal   Collection Time: 04/11/15 11:07 AM  Result Value Ref Range   Glucose-Capillary 203 (H) 65 - 99 mg/dL  Basic metabolic panel  Status: Abnormal   Collection Time: 04/11/15 11:35 AM  Result Value Ref Range   Sodium 131 (L) 135 - 145 mmol/L   Potassium 3.4 (L) 3.5 - 5.1 mmol/L   Chloride 98 (L) 101 - 111 mmol/L   CO2 25 22 - 32 mmol/L   Glucose, Bld 222 (H) 65 - 99 mg/dL   BUN 9 6 - 20 mg/dL   Creatinine, Ser 0.86 0.44 - 1.00 mg/dL   Calcium 8.0 (L) 8.9 - 10.3 mg/dL   GFR calc non Af Amer >60 >60 mL/min   GFR calc Af Amer >60 >60 mL/min   Anion gap 8 5 - 15    Signed: Charlesetta Shanks, MD 04/11/2015, 1:12 PM    Services Ordered on Discharge: None. Equipment Ordered on Discharge: None.

## 2015-04-11 NOTE — ED Notes (Signed)
Pt ambulates at 100% SpO2 with no desat

## 2015-04-13 ENCOUNTER — Telehealth: Payer: Self-pay | Admitting: *Deleted

## 2015-04-13 NOTE — Telephone Encounter (Signed)
Daughter - [name] [phone] called to c/o how mother was treated in ER. Suggest for her to call pt experience [phone]. Decided to cancel appt 04/18/15 3:15PM At [facility] Dr Alice Rieger. Daughter states mother has appt with PCP. Hilda Blades Finnick Orosz RN 04/13/15 2:45PM

## 2015-04-18 ENCOUNTER — Ambulatory Visit: Payer: Medicare Other | Admitting: Internal Medicine

## 2015-04-28 NOTE — Addendum Note (Signed)
Addended by: Hulan Fray on: 04/28/2015 02:55 PM   Modules accepted: Orders

## 2017-12-26 ENCOUNTER — Emergency Department (HOSPITAL_COMMUNITY): Payer: Medicare Other

## 2017-12-26 ENCOUNTER — Inpatient Hospital Stay (HOSPITAL_COMMUNITY)
Admission: EM | Admit: 2017-12-26 | Discharge: 2018-01-02 | DRG: 193 | Disposition: A | Discharge status: Home / Self Care | Payer: Medicare Other | Attending: Internal Medicine | Admitting: Internal Medicine

## 2017-12-26 ENCOUNTER — Other Ambulatory Visit: Payer: Self-pay

## 2017-12-26 ENCOUNTER — Encounter (HOSPITAL_COMMUNITY): Payer: Self-pay

## 2017-12-26 DIAGNOSIS — J9601 Acute respiratory failure with hypoxia: Secondary | ICD-10-CM | POA: Diagnosis not present

## 2017-12-26 DIAGNOSIS — R197 Diarrhea, unspecified: Secondary | ICD-10-CM | POA: Diagnosis present

## 2017-12-26 DIAGNOSIS — J189 Pneumonia, unspecified organism: Secondary | ICD-10-CM | POA: Diagnosis present

## 2017-12-26 DIAGNOSIS — Z79899 Other long term (current) drug therapy: Secondary | ICD-10-CM | POA: Diagnosis not present

## 2017-12-26 DIAGNOSIS — R0602 Shortness of breath: Secondary | ICD-10-CM | POA: Diagnosis present

## 2017-12-26 DIAGNOSIS — Z8673 Personal history of transient ischemic attack (TIA), and cerebral infarction without residual deficits: Secondary | ICD-10-CM

## 2017-12-26 DIAGNOSIS — E119 Type 2 diabetes mellitus without complications: Secondary | ICD-10-CM

## 2017-12-26 DIAGNOSIS — I951 Orthostatic hypotension: Secondary | ICD-10-CM | POA: Diagnosis present

## 2017-12-26 DIAGNOSIS — J101 Influenza due to other identified influenza virus with other respiratory manifestations: Secondary | ICD-10-CM | POA: Diagnosis present

## 2017-12-26 DIAGNOSIS — Z20828 Contact with and (suspected) exposure to other viral communicable diseases: Secondary | ICD-10-CM | POA: Diagnosis present

## 2017-12-26 DIAGNOSIS — I10 Essential (primary) hypertension: Secondary | ICD-10-CM | POA: Diagnosis not present

## 2017-12-26 DIAGNOSIS — J1000 Influenza due to other identified influenza virus with unspecified type of pneumonia: Principal | ICD-10-CM | POA: Diagnosis present

## 2017-12-26 DIAGNOSIS — T380X5A Adverse effect of glucocorticoids and synthetic analogues, initial encounter: Secondary | ICD-10-CM | POA: Diagnosis present

## 2017-12-26 DIAGNOSIS — E860 Dehydration: Secondary | ICD-10-CM | POA: Diagnosis present

## 2017-12-26 DIAGNOSIS — E1165 Type 2 diabetes mellitus with hyperglycemia: Secondary | ICD-10-CM | POA: Diagnosis present

## 2017-12-26 DIAGNOSIS — K5900 Constipation, unspecified: Secondary | ICD-10-CM | POA: Diagnosis present

## 2017-12-26 DIAGNOSIS — R0902 Hypoxemia: Secondary | ICD-10-CM

## 2017-12-26 DIAGNOSIS — R0600 Dyspnea, unspecified: Secondary | ICD-10-CM | POA: Diagnosis not present

## 2017-12-26 DIAGNOSIS — Z7951 Long term (current) use of inhaled steroids: Secondary | ICD-10-CM | POA: Diagnosis not present

## 2017-12-26 DIAGNOSIS — F17210 Nicotine dependence, cigarettes, uncomplicated: Secondary | ICD-10-CM | POA: Diagnosis present

## 2017-12-26 DIAGNOSIS — J441 Chronic obstructive pulmonary disease with (acute) exacerbation: Secondary | ICD-10-CM | POA: Diagnosis present

## 2017-12-26 DIAGNOSIS — J810 Acute pulmonary edema: Secondary | ICD-10-CM | POA: Diagnosis not present

## 2017-12-26 DIAGNOSIS — Z7982 Long term (current) use of aspirin: Secondary | ICD-10-CM

## 2017-12-26 DIAGNOSIS — J129 Viral pneumonia, unspecified: Secondary | ICD-10-CM | POA: Diagnosis present

## 2017-12-26 DIAGNOSIS — Z885 Allergy status to narcotic agent status: Secondary | ICD-10-CM

## 2017-12-26 DIAGNOSIS — E8770 Fluid overload, unspecified: Secondary | ICD-10-CM | POA: Diagnosis present

## 2017-12-26 DIAGNOSIS — Z794 Long term (current) use of insulin: Secondary | ICD-10-CM | POA: Diagnosis not present

## 2017-12-26 DIAGNOSIS — J440 Chronic obstructive pulmonary disease with acute lower respiratory infection: Secondary | ICD-10-CM | POA: Diagnosis present

## 2017-12-26 HISTORY — DX: Chronic obstructive pulmonary disease, unspecified: J44.9

## 2017-12-26 HISTORY — DX: Cerebral infarction, unspecified: I63.9

## 2017-12-26 LAB — COMPREHENSIVE METABOLIC PANEL
ALT: 20 U/L (ref 14–54)
AST: 40 U/L (ref 15–41)
Albumin: 3.5 g/dL (ref 3.5–5.0)
Alkaline Phosphatase: 65 U/L (ref 38–126)
Anion gap: 13 (ref 5–15)
BUN: 29 mg/dL — ABNORMAL HIGH (ref 6–20)
CO2: 24 mmol/L (ref 22–32)
Calcium: 8.8 mg/dL — ABNORMAL LOW (ref 8.9–10.3)
Chloride: 96 mmol/L — ABNORMAL LOW (ref 101–111)
Creatinine, Ser: 1.15 mg/dL — ABNORMAL HIGH (ref 0.44–1.00)
GFR calc Af Amer: 51 mL/min — ABNORMAL LOW (ref >60)
GFR calc non Af Amer: 44 mL/min — ABNORMAL LOW (ref >60)
Glucose, Bld: 118 mg/dL — ABNORMAL HIGH (ref 65–99)
Potassium: 3.6 mmol/L (ref 3.5–5.1)
Sodium: 133 mmol/L — ABNORMAL LOW (ref 135–145)
Total Bilirubin: 0.4 mg/dL (ref 0.3–1.2)
Total Protein: 6.7 g/dL (ref 6.5–8.1)

## 2017-12-26 LAB — CBC WITH DIFFERENTIAL/PLATELET
Basophils Absolute: 0 10*3/uL (ref 0.0–0.1)
Basophils Relative: 0 %
Eosinophils Absolute: 0 10*3/uL (ref 0.0–0.7)
Eosinophils Relative: 0 %
HCT: 33.4 % — ABNORMAL LOW (ref 36.0–46.0)
Hemoglobin: 11.1 g/dL — ABNORMAL LOW (ref 12.0–15.0)
Lymphocytes Relative: 18 %
Lymphs Abs: 1.1 10*3/uL (ref 0.7–4.0)
MCH: 28.5 pg (ref 26.0–34.0)
MCHC: 33.2 g/dL (ref 30.0–36.0)
MCV: 85.9 fL (ref 78.0–100.0)
Monocytes Absolute: 0.7 10*3/uL (ref 0.1–1.0)
Monocytes Relative: 12 %
Neutro Abs: 4.3 10*3/uL (ref 1.7–7.7)
Neutrophils Relative %: 70 %
Platelets: 175 10*3/uL (ref 150–400)
RBC: 3.89 MIL/uL (ref 3.87–5.11)
RDW: 14 % (ref 11.5–15.5)
WBC: 6.2 10*3/uL (ref 4.0–10.5)

## 2017-12-26 LAB — BLOOD GAS, VENOUS
Acid-Base Excess: 0.8 mmol/L (ref 0.0–2.0)
Bicarbonate: 27.9 mmol/L (ref 20.0–28.0)
O2 Saturation: 35.2 %
PH VEN: 7.331 (ref 7.250–7.430)
Patient temperature: 98.6
pCO2, Ven: 54.3 mmHg (ref 44.0–60.0)

## 2017-12-26 LAB — I-STAT CHEM 8, ED
BUN: 32 mg/dL — ABNORMAL HIGH (ref 6–20)
Calcium, Ion: 1.17 mmol/L (ref 1.15–1.40)
Chloride: 96 mmol/L — ABNORMAL LOW (ref 101–111)
Creatinine, Ser: 1.1 mg/dL — ABNORMAL HIGH (ref 0.44–1.00)
Glucose, Bld: 113 mg/dL — ABNORMAL HIGH (ref 65–99)
HCT: 35 % — ABNORMAL LOW (ref 36.0–46.0)
Hemoglobin: 11.9 g/dL — ABNORMAL LOW (ref 12.0–15.0)
Potassium: 3.9 mmol/L (ref 3.5–5.1)
Sodium: 134 mmol/L — ABNORMAL LOW (ref 135–145)
TCO2: 30 mmol/L (ref 22–32)

## 2017-12-26 LAB — GLUCOSE, CAPILLARY
GLUCOSE-CAPILLARY: 99 mg/dL (ref 65–99)
Glucose-Capillary: 109 mg/dL — ABNORMAL HIGH (ref 65–99)

## 2017-12-26 LAB — I-STAT CG4 LACTIC ACID, ED: Lactic Acid, Venous: 0.93 mmol/L (ref 0.5–1.9)

## 2017-12-26 LAB — PHOSPHORUS: Phosphorus: 3.8 mg/dL (ref 2.5–4.6)

## 2017-12-26 LAB — I-STAT TROPONIN, ED: Troponin i, poc: 0.01 ng/mL (ref 0.00–0.08)

## 2017-12-26 LAB — LIPASE, BLOOD: LIPASE: 20 U/L (ref 11–51)

## 2017-12-26 LAB — INFLUENZA PANEL BY PCR (TYPE A & B)
INFLAPCR: POSITIVE — AB
INFLBPCR: NEGATIVE

## 2017-12-26 LAB — MAGNESIUM: MAGNESIUM: 1.8 mg/dL (ref 1.7–2.4)

## 2017-12-26 MED ORDER — IPRATROPIUM-ALBUTEROL 0.5-2.5 (3) MG/3ML IN SOLN
3.0000 mL | Freq: Four times a day (QID) | RESPIRATORY_TRACT | Status: DC
  Administered 2017-12-26 – 2017-12-27 (×6): 3 mL via RESPIRATORY_TRACT
  Filled 2017-12-26 (×6): qty 3

## 2017-12-26 MED ORDER — IPRATROPIUM BROMIDE 0.02 % IN SOLN
0.5000 mg | Freq: Once | RESPIRATORY_TRACT | Status: AC
  Administered 2017-12-26: 0.5 mg via RESPIRATORY_TRACT
  Filled 2017-12-26: qty 2.5

## 2017-12-26 MED ORDER — IOPAMIDOL (ISOVUE-370) INJECTION 76%
INTRAVENOUS | Status: AC
Start: 2017-12-26 — End: 2017-12-26
  Administered 2017-12-26: 100 mL
  Filled 2017-12-26: qty 100

## 2017-12-26 MED ORDER — ALBUTEROL SULFATE (2.5 MG/3ML) 0.083% IN NEBU
5.0000 mg | INHALATION_SOLUTION | Freq: Once | RESPIRATORY_TRACT | Status: AC
  Administered 2017-12-26: 5 mg via RESPIRATORY_TRACT
  Filled 2017-12-26: qty 6

## 2017-12-26 MED ORDER — SODIUM CHLORIDE 0.9 % IV SOLN
500.0000 mg | INTRAVENOUS | Status: DC
  Administered 2017-12-27 – 2017-12-28 (×2): 500 mg via INTRAVENOUS
  Filled 2017-12-26 (×2): qty 500

## 2017-12-26 MED ORDER — INSULIN ASPART 100 UNIT/ML ~~LOC~~ SOLN
0.0000 [IU] | Freq: Three times a day (TID) | SUBCUTANEOUS | Status: DC
  Administered 2017-12-27: 3 [IU] via SUBCUTANEOUS
  Administered 2017-12-28: 7 [IU] via SUBCUTANEOUS
  Administered 2017-12-28: 5 [IU] via SUBCUTANEOUS
  Administered 2017-12-28: 7 [IU] via SUBCUTANEOUS
  Administered 2017-12-29: 5 [IU] via SUBCUTANEOUS
  Administered 2017-12-29: 7 [IU] via SUBCUTANEOUS

## 2017-12-26 MED ORDER — SODIUM CHLORIDE 0.9 % IV BOLUS (SEPSIS)
1000.0000 mL | Freq: Once | INTRAVENOUS | Status: AC
  Administered 2017-12-26: 1000 mL via INTRAVENOUS

## 2017-12-26 MED ORDER — SODIUM CHLORIDE 0.9 % IV SOLN
INTRAVENOUS | Status: DC
  Administered 2017-12-26: 16:00:00 via INTRAVENOUS
  Administered 2017-12-26: 75 mL/h via INTRAVENOUS

## 2017-12-26 MED ORDER — SODIUM CHLORIDE 0.9 % IJ SOLN
INTRAMUSCULAR | Status: AC
  Filled 2017-12-26: qty 50

## 2017-12-26 MED ORDER — SODIUM CHLORIDE 0.9 % IV SOLN
500.0000 mg | Freq: Once | INTRAVENOUS | Status: AC
  Administered 2017-12-26: 500 mg via INTRAVENOUS
  Filled 2017-12-26: qty 500

## 2017-12-26 MED ORDER — ONDANSETRON HCL 4 MG/2ML IJ SOLN
4.0000 mg | Freq: Once | INTRAMUSCULAR | Status: AC
  Administered 2017-12-26: 4 mg via INTRAVENOUS
  Filled 2017-12-26: qty 2

## 2017-12-26 MED ORDER — CHOLESTYRAMINE 4 G PO PACK
4.0000 g | PACK | ORAL | Status: DC
  Administered 2017-12-26 – 2018-01-02 (×9): 4 g via ORAL
  Filled 2017-12-26 (×14): qty 1

## 2017-12-26 MED ORDER — CEFTRIAXONE SODIUM 1 G IJ SOLR
1.0000 g | Freq: Once | INTRAMUSCULAR | Status: AC
  Administered 2017-12-26: 1 g via INTRAVENOUS
  Filled 2017-12-26: qty 10

## 2017-12-26 MED ORDER — PRAVASTATIN SODIUM 40 MG PO TABS
40.0000 mg | ORAL_TABLET | Freq: Every day | ORAL | Status: DC
  Administered 2017-12-26 – 2018-01-02 (×8): 40 mg via ORAL
  Filled 2017-12-26 (×8): qty 1

## 2017-12-26 MED ORDER — ASPIRIN 81 MG PO CHEW
81.0000 mg | CHEWABLE_TABLET | Freq: Every day | ORAL | Status: DC
  Administered 2017-12-26 – 2018-01-02 (×8): 81 mg via ORAL
  Filled 2017-12-26 (×8): qty 1

## 2017-12-26 MED ORDER — SODIUM CHLORIDE 0.9 % IV SOLN
1.0000 g | INTRAVENOUS | Status: DC
  Administered 2017-12-27 – 2017-12-31 (×5): 1 g via INTRAVENOUS
  Filled 2017-12-26 (×5): qty 1

## 2017-12-26 MED ORDER — LORATADINE 10 MG PO TABS
10.0000 mg | ORAL_TABLET | Freq: Every day | ORAL | Status: DC
  Administered 2017-12-26 – 2018-01-02 (×8): 10 mg via ORAL
  Filled 2017-12-26 (×8): qty 1

## 2017-12-26 MED ORDER — CHOLECALCIFEROL 10 MCG (400 UNIT) PO TABS
400.0000 [IU] | ORAL_TABLET | Freq: Every day | ORAL | Status: DC
  Administered 2017-12-26 – 2018-01-02 (×8): 400 [IU] via ORAL
  Filled 2017-12-26 (×8): qty 1

## 2017-12-26 MED ORDER — ALBUTEROL SULFATE (2.5 MG/3ML) 0.083% IN NEBU
2.5000 mg | INHALATION_SOLUTION | RESPIRATORY_TRACT | Status: DC | PRN
  Administered 2017-12-26: 2.5 mg via RESPIRATORY_TRACT
  Filled 2017-12-26: qty 3

## 2017-12-26 MED ORDER — OSELTAMIVIR PHOSPHATE 30 MG PO CAPS
30.0000 mg | ORAL_CAPSULE | Freq: Two times a day (BID) | ORAL | Status: AC
  Administered 2017-12-26 – 2017-12-30 (×10): 30 mg via ORAL
  Filled 2017-12-26 (×11): qty 1

## 2017-12-26 MED ORDER — INSULIN ASPART PROT & ASPART (70-30 MIX) 100 UNIT/ML ~~LOC~~ SUSP
10.0000 [IU] | Freq: Two times a day (BID) | SUBCUTANEOUS | Status: DC
  Administered 2017-12-26 – 2017-12-29 (×6): 10 [IU] via SUBCUTANEOUS
  Filled 2017-12-26: qty 10

## 2017-12-26 MED ORDER — FLUTICASONE PROPIONATE 50 MCG/ACT NA SUSP: 1.0000 | Freq: Every day | NASAL | Status: DC | PRN

## 2017-12-26 NOTE — ED Notes (Signed)
ED TO INPATIENT HANDOFF REPORT  Name/Age/Gender Stefanie Vincent 77 y.o. female  Code Status    Code Status Orders  (From admission, onward)        Start     Ordered   12/26/17 1217  Full code  Continuous     12/26/17 1217    Code Status History    Date Active Date Inactive Code Status Order ID Comments User Context   04/11/2015 06:36 04/11/2015 17:10 Full Code [id number]  Bethena Roys, MD ED   03/12/2015 17:20 03/14/2015 22:23 Full Code [id number]  Corky Sox, MD Inpatient      Home/SNF/Other Home  Chief Complaint n.v  Level of Care/Admitting Diagnosis ED Disposition    ED Disposition Condition [place]: [facility] [[id number]]  Level of Care: Med-Surg [16]  Diagnosis: Influenza A [341937]  Admitting Physician: Phillips Grout [[id number]]  Attending Physician: Derrill Kay A [[id number]]  Estimated length of stay: past midnight tomorrow  Certification:: I certify this patient will need inpatient services for at least 2 midnights  PT Class (Do Not Modify): Inpatient [101]  PT Acc Code (Do Not Modify): Private [1]       Medical History Past Medical History:  Diagnosis Date  . Arthritis   . Blood transfusion without reported diagnosis   . COPD (chronic obstructive pulmonary disease) ([place])   . Diabetes mellitus without complication ([place])   . Hypertension   . Stroke [facility])     Allergies Allergies  Allergen Reactions  . Codeine     Makes patient depressed.    IV Location/Drains/Wounds Patient Lines/Drains/Airways Status   Active Line/Drains/Airways    Name:   Placement date:   Placement time:   Site:   Days:   Peripheral IV 12/26/17 Left Antecubital   12/26/17    0851    Antecubital   less than 1          Labs/Imaging Results for orders placed or performed during the hospital encounter of 12/26/17 (from the past 48 hour(s))  CBC with Differential/Platelet     Status: Abnormal   Collection Time: 12/26/17  9:34 AM   Result Value Ref Range   WBC 6.2 4.0 - 10.5 K/uL   RBC 3.89 3.87 - 5.11 MIL/uL   Hemoglobin 11.1 (L) 12.0 - 15.0 g/dL   HCT 33.4 (L) 36.0 - 46.0 %   MCV 85.9 78.0 - 100.0 fL   MCH 28.5 26.0 - 34.0 pg   MCHC 33.2 30.0 - 36.0 g/dL   RDW 14.0 11.5 - 15.5 %   Platelets 175 150 - 400 K/uL   Neutrophils Relative % 70 %   Neutro Abs 4.3 1.7 - 7.7 K/uL   Lymphocytes Relative 18 %   Lymphs Abs 1.1 0.7 - 4.0 K/uL   Monocytes Relative 12 %   Monocytes Absolute 0.7 0.1 - 1.0 K/uL   Eosinophils Relative 0 %   Eosinophils Absolute 0.0 0.0 - 0.7 K/uL   Basophils Relative 0 %   Basophils Absolute 0.0 0.0 - 0.1 K/uL    Comment: Performed at [facility], [place] [address] [place], [place] [address]  Comprehensive metabolic panel     Status: Abnormal   Collection Time: 12/26/17  9:34 AM  Result Value Ref Range   Sodium 133 (L) 135 - 145 mmol/L   Potassium 3.6 3.5 - 5.1 mmol/L   Chloride 96 (L) 101 - 111 mmol/L   CO2 24 22 -  32 mmol/L   Glucose, Bld 118 (H) 65 - 99 mg/dL   BUN 29 (H) 6 - 20 mg/dL   Creatinine, Ser 1.15 (H) 0.44 - 1.00 mg/dL   Calcium 8.8 (L) 8.9 - 10.3 mg/dL   Total Protein 6.7 6.5 - 8.1 g/dL   Albumin 3.5 3.5 - 5.0 g/dL   AST 40 15 - 41 U/L   ALT 20 14 - 54 U/L   Alkaline Phosphatase 65 38 - 126 U/L   Total Bilirubin 0.4 0.3 - 1.2 mg/dL   GFR calc non Af Amer 44 (L) >60 mL/min   GFR calc Af Amer 51 (L) >60 mL/min    Comment: (NOTE) The eGFR has been calculated using the CKD EPI equation. This calculation has not been validated in all clinical situations. eGFR's persistently <60 mL/min signify possible Chronic Kidney Disease.    Anion gap 13 5 - 15    Comment: Performed at [facility], [place] [address] [place], [place] [address]  Lipase, blood     Status: None   Collection Time: 12/26/17  9:34 AM  Result Value Ref Range   Lipase 20 11 - 51 U/L    Comment: Performed at [facility], [place] [address]  [place], [place] [address]  Magnesium     Status: None   Collection Time: 12/26/17  9:34 AM  Result Value Ref Range   Magnesium 1.8 1.7 - 2.4 mg/dL    Comment: Performed at [facility], [place] [address], [place], [place] [address]  Phosphorus     Status: None   Collection Time: 12/26/17  9:34 AM  Result Value Ref Range   Phosphorus 3.8 2.5 - 4.6 mg/dL    Comment: Performed at [facility], [place] [address] [place], [place] [address]  Influenza panel by PCR (type A & B)     Status: Abnormal   Collection Time: 12/26/17  9:35 AM  Result Value Ref Range   Influenza A By PCR POSITIVE (A) NEGATIVE   Influenza B By PCR NEGATIVE NEGATIVE    Comment: (NOTE) The Xpert Xpress Flu assay is intended as an aid in the diagnosis of  influenza and should not be used as a sole basis for treatment.  This  assay is FDA approved for nasopharyngeal swab specimens only. Nasal  washings and aspirates are unacceptable for Xpert Xpress Flu testing. Performed at [facility], [place] [address] [place], [place] [address]   I-stat troponin, ED     Status: None   Collection Time: 12/26/17  9:41 AM  Result Value Ref Range   Troponin i, poc 0.01 0.00 - 0.08 ng/mL   Comment 3            Comment: Due to the release kinetics of cTnI, a negative result within the first hours of the onset of symptoms does not rule out myocardial infarction with certainty. If myocardial infarction is still suspected, repeat the test at appropriate intervals.   I-stat chem 8, ed     Status: Abnormal   Collection Time: 12/26/17  9:43 AM  Result Value Ref Range   Sodium 134 (L) 135 - 145 mmol/L   Potassium 3.9 3.5 - 5.1 mmol/L   Chloride 96 (L) 101 - 111 mmol/L   BUN 32 (H) 6 - 20 mg/dL   Creatinine, Ser 1.10 (H) 0.44 - 1.00 mg/dL   Glucose, Bld 113 (H) 65 - 99 mg/dL   Calcium, Ion 1.17  1.15 - 1.40 mmol/L   TCO2 30 22 - 32 mmol/L   Hemoglobin 11.9 (L) 12.0 - 15.0 g/dL   HCT 35.0  (L) 36.0 - 46.0 %  I-Stat CG4 Lactic Acid, ED     Status: None   Collection Time: 12/26/17  9:43 AM  Result Value Ref Range   Lactic Acid, Venous 0.93 0.5 - 1.9 mmol/L  Blood gas, venous     Status: None   Collection Time: 12/26/17  9:54 AM  Result Value Ref Range   pH, Ven 7.331 7.250 - 7.430   pCO2, Ven 54.3 44.0 - 60.0 mmHg   pO2, Ven  32.0 - 45.0 mmHg    CRITICAL RESULT CALLED TO, READ BACK BY AND VERIFIED WITH:    Comment: MD YAO AT [facility] [place] RRT ON 12/26/17   Bicarbonate 27.9 20.0 - 28.0 mmol/L   Acid-Base Excess 0.8 0.0 - 2.0 mmol/L   O2 Saturation 35.2 %   Patient temperature 98.6    Collection site DRAWN BY RN    Drawn by DRAWN BY RN    Sample type VEIN     Comment: Performed at [facility], [place] [address] [place], [place] [address]   Ct Angio Chest Pe W And/or Wo Contrast  Result Date: 12/26/2017 CLINICAL DATA:  77 year old female with nausea and vomiting. Shortness of breath and cough. Suspected pulmonary embolus. EXAM: CT ANGIOGRAPHY CHEST WITH CONTRAST TECHNIQUE: Multidetector CT imaging of the chest was performed using the standard protocol during bolus administration of intravenous contrast. Multiplanar CT image reconstructions and MIPs were obtained to evaluate the vascular anatomy. CONTRAST:  124m ISOVUE-370 IOPAMIDOL (ISOVUE-370) INJECTION 76% COMPARISON:  Portable chest radiograph 0905 hr today. CT Abdomen and Pelvis today reported separately. FINDINGS: Cardiovascular: Excellent contrast bolus timing in the pulmonary arterial tree. Respiratory motion in the lower lobes. No central, hilar, or segmental pulmonary artery filling defect. The bilateral upper lobe pulmonary arteries appear patent. The sub segmental and distal lower lobe pulmonary artery detail is obscured by motion. No filling defect is identified. Mild cardiomegaly. No pericardial effusion. Calcified aortic atherosclerosis. And calcified coronary artery atherosclerosis. Bulky great  vessel origin calcified plaque. No definite great vessel origin occlusion. Mediastinum/Nodes: Negative.  No lymphadenopathy. Lungs/Pleura: Atelectatic changes to the trachea and major airways. Scattered bilateral small peripheral and occasionally peribronchial areas of pulmonary ground-glass opacity. The upper lobes are most affected, but there is definite right lower lobe involvement. No consolidation. No pleural effusion. Upper Abdomen: Abdomen findings are reported separately with the CT Abdomen and Pelvis today. Musculoskeletal: Osteopenia and degenerative changes throughout the thoracic spine. No acute osseous abnormality identified. Review of the MIP images confirms the above findings. IMPRESSION: 1. Distal lower lobe pulmonary artery detail is obscured by respiratory motion, but otherwise negative for acute pulmonary embolus. 2. Scattered bilateral upper lobe and peripheral predominant pulmonary ground-glass opacity is nonspecific. Consider acute bilateral viral/atypical distal airway infection. No pleural effusion. 3. Extensive Aortic Atherosclerosis (ICD10-I70.0) and calcified coronary artery atherosclerosis. 4. See also CT Abdomen and Pelvis today reported separately. Electronically Signed   By: HGenevie AnnM.D.   On: 12/26/2017 10:59   Ct Abdomen Pelvis W Contrast  Result Date: 12/26/2017 CLINICAL DATA:  77year old female with nausea and vomiting. Shortness of breath and cough. EXAM: CT ABDOMEN AND PELVIS WITH CONTRAST TECHNIQUE: Multidetector CT imaging of the abdomen and pelvis was performed using the standard protocol following bolus administration of intravenous contrast. CONTRAST:  1066mISOVUE-370 IOPAMIDOL (ISOVUE-370) INJECTION  76% COMPARISON:  Chest CTA today reported separately. FINDINGS: Lower chest: Stable with regard to the chest CTA findings today reported separately, including small areas of peripheral ground-glass opacity in the right lower lobe. Hepatobiliary: Surgically absent  gallbladder. Liver and biliary tree within normal limits. Pancreas: Chronic calcific pancreatitis and pancreatic atrophy. Associated dilatation of the main pancreatic duct in the tail. No pancreatic mass or inflammation identified. Spleen: Negative. Adrenals/Urinary Tract: Normal adrenal glands. Bilateral renal contrast enhancement and excretion is symmetric and normal. Renal hilar vascular calcifications suspected, no definite nephrolithiasis. Small left renal midpole simple cyst. No perinephric stranding. Negative ureters. Unremarkable urinary bladder. Stomach/Bowel: Negative rectum. Redundant but otherwise negative sigmoid colon. Negative left colon and transverse colon. Negative right colon and retrocecal appendix. Negative terminal ileum. No dilated or abnormal small bowel loops identified. The stomach is largely decompressed. Negative duodenum. No abdominal free air or free fluid. Vascular/Lymphatic: Extensive aortoiliac calcified atherosclerosis. Calcified atherosclerosis involving major aortic branches including the distal SMA (series 4, image 34). Despite this, major arterial structures in the abdomen and pelvis remain patent. Portal venous system is patent. No lymphadenopathy. Reproductive: Negative. Other: No pelvic free fluid. Musculoskeletal: Advanced lumbar spine degeneration. No acute osseous abnormality identified. IMPRESSION: 1. No acute or inflammatory process identified in the abdomen or pelvis. 2. Extensive aortoiliac atherosclerosis. The major arterial structures remain patent. 3. Advanced lumbar spine degeneration. Electronically Signed   By: Genevie Ann M.D.   On: 12/26/2017 11:05   Dg Chest Port 1 View  Result Date: 12/26/2017 CLINICAL DATA:  Shortness of breath and cough EXAM: PORTABLE CHEST 1 VIEW COMPARISON:  None. FINDINGS: Lungs are clear. The heart size and pulmonary vascularity are normal. No adenopathy. There is aortic atherosclerosis. No bone lesions. IMPRESSION: Aortic  atherosclerosis.  No edema or consolidation. Aortic Atherosclerosis (ICD10-I70.0). Electronically Signed   By: Lowella Grip III M.D.   On: 12/26/2017 09:27    Pending Labs Unresulted Labs (From admission, onward)   Start     Ordered   12/27/17 8119  Basic metabolic panel  Tomorrow morning,   R     12/26/17 1217   12/27/17 0500  CBC WITH DIFFERENTIAL  Tomorrow morning,   R     12/26/17 1217   12/26/17 1216  Culture, sputum-assessment  Once,   R     12/26/17 1217   12/26/17 1216  Gram stain  Once,   R     12/26/17 1217   12/26/17 1216  Strep pneumoniae urinary antigen  Once,   R     12/26/17 1217   12/26/17 0858  Urinalysis, Routine w reflex microscopic  Once,   R     12/26/17 0857   12/26/17 0858  Urine culture  STAT,   STAT     12/26/17 0857   12/26/17 0855  Blood culture (routine x 2)  BLOOD CULTURE X 2,   STAT     12/26/17 0854      Vitals/Pain Today's Vitals   12/26/17 1357 12/26/17 1400 12/26/17 1418 12/26/17 1432  BP: (!) 128/49 135/71  109/75  Pulse: (!) 55 65  (!) 57  Resp: 17 (!) 27  16  Temp:   97.9 F (36.6 C)   TempSrc:   Oral   SpO2: 95% 96%  95%  Weight:      Height:      PainSc:        Isolation Precautions No active isolations  Medications Medications  sodium chloride 0.9 % injection (not  administered)  oseltamivir (TAMIFLU) capsule 30 mg (30 mg Oral Given 12/26/17 1312)  cholecalciferol (VITAMIN D) tablet 400 Units (not administered)  loratadine (CLARITIN) tablet 10 mg (not administered)  insulin aspart protamine- aspart (NOVOLOG MIX 70/30) injection 10 Units (not administered)  cholestyramine (QUESTRAN) packet 4 g (not administered)  fluticasone (FLONASE) 50 MCG/ACT nasal spray 1 spray (not administered)  aspirin chewable tablet 81 mg (not administered)  pravastatin (PRAVACHOL) tablet 40 mg (not administered)  0.9 %  sodium chloride infusion (75 mL/hr Intravenous New Bag/Given 12/26/17 1330)  cefTRIAXone (ROCEPHIN) 1 g in sodium chloride 0.9  % 100 mL IVPB (not administered)  azithromycin (ZITHROMAX) 500 mg in sodium chloride 0.9 % 250 mL IVPB (not administered)  insulin aspart (novoLOG) injection 0-9 Units (not administered)  albuterol (PROVENTIL) (2.5 MG/3ML) 0.083% nebulizer solution 2.5 mg (not administered)  ipratropium-albuterol (DUONEB) 0.5-2.5 (3) MG/3ML nebulizer solution 3 mL (3 mLs Nebulization Given 12/26/17 1333)  sodium chloride 0.9 % bolus 1,000 mL (0 mLs Intravenous Stopped 12/26/17 1200)  ondansetron (ZOFRAN) injection 4 mg (4 mg Intravenous Given 12/26/17 0952)  albuterol (PROVENTIL) (2.5 MG/3ML) 0.083% nebulizer solution 5 mg (5 mg Nebulization Given 12/26/17 0921)  ipratropium (ATROVENT) nebulizer solution 0.5 mg (0.5 mg Nebulization Given 12/26/17 0921)  iopamidol (ISOVUE-370) 76 % injection (100 mLs  Contrast Given 12/26/17 1029)  cefTRIAXone (ROCEPHIN) 1 g in sodium chloride 0.9 % 100 mL IVPB (0 g Intravenous Stopped 12/26/17 1205)  azithromycin (ZITHROMAX) 500 mg in sodium chloride 0.9 % 250 mL IVPB (0 mg Intravenous Stopped 12/26/17 1314)    Mobility manual wheelchair

## 2017-12-26 NOTE — H&P (Signed)
History and Physical    Stefanie Vincent GNF:[id number] DOB: 1941/08/12 DOA: 12/26/2017  PCP: Helane Rima, MD  Patient coming from: Home  Chief Complaint: Shortness of breath  HPI: Stefanie Vincent is a 77 y.o. female with medical history significant of COPD, hypertension comes in with a week of coughing progressive worsening shortness of breath.  Her grandson who lives with her as been recently diagnosed with the flu.  She been having a lot of nasal congestion.  She says she was feeling fine until yesterday when she got very weak and she could hardly even move.  She says she has been running fever for a week.  She denies any nausea vomiting or diarrhea.  She does not require oxygen supplementation at home.  She is been given a neb treatment in the emergency department and her breathing feels better.  She called EMS and her O2 sats were 80% on room air.  Patient is found to have influenza and pneumonia.  She is referred for admission for her pneumonia, influenza and acute respiratory failure with hypoxia.  Review of Systems: As per HPI otherwise 10 point review of systems negative.   Past Medical History:  Diagnosis Date  . Arthritis   . Blood transfusion without reported diagnosis   . COPD (chronic obstructive pulmonary disease) ([place])   . Diabetes mellitus without complication ([place])   . Hypertension   . Stroke [facility])     Past Surgical History:  Procedure Laterality Date  . amputation of foot    . CHOLECYSTECTOMY       reports that she has been smoking cigarettes.  She has a 60.00 pack-year smoking history. she has never used smokeless tobacco. She reports that she does not drink alcohol or use drugs.  Allergies  Allergen Reactions  . Codeine     Makes patient depressed.    Family History  Problem Relation Age of Onset  . Stroke Mother   . Cancer Father     Prior to Admission medications   Medication Sig Start Date End Date Taking? Authorizing Provider  aspirin 81 MG  chewable tablet Chew 81 mg by mouth daily. 05/18/17 05/18/18 Yes [provider]  atenolol (TENORMIN) 50 MG tablet Take 1 tablet (50 mg total) by mouth daily. 04/11/15  Yes Moding, Langley Gauss, MD  cholestyramine Lucrezia Starch) 4 G packet Take 1 packet (4 g total) by mouth 2 (two) times daily. 04/11/15  Yes Moding, Langley Gauss, MD  fluticasone (FLONASE) 50 MCG/ACT nasal spray Place 1 spray into both nostrils daily. 04/11/15  Yes Moding, Langley Gauss, MD  insulin aspart protamine- aspart (NOVOLOG MIX 70/30) (70-30) 100 UNIT/ML injection Inject 0.1 mLs (10 Units total) into the skin 2 (two) times daily with a meal. 03/14/15  Yes Corky Sox, MD  loratadine (CLARITIN) 10 MG tablet Take 10 mg by mouth daily.    Yes [provider]  losartan (COZAAR) 50 MG tablet Take 50 mg by mouth daily. 08/21/17  Yes [provider]  omeprazole (PRILOSEC) 20 MG capsule Take 20 mg by mouth daily as needed (allergies).   Yes [provider]  pravastatin (PRAVACHOL) 40 MG tablet Take 40 mg by mouth daily. 12/16/17  Yes [provider]  vitamin D, CHOLECALCIFEROL, 400 UNITS tablet Take 400 Units by mouth daily.   Yes [provider]  albuterol (PROVENTIL HFA;VENTOLIN HFA) 108 (90 BASE) MCG/ACT inhaler Inhale 2 puffs into the lungs every 6 (six) hours as needed for wheezing or shortness  of breath (cough, shortness of breath or wheezing.). Patient not taking: Reported on 12/26/2017 03/08/15   Didiano, Deanna M, DO  losartan (COZAAR) 25 MG tablet Take 1 tablet (25 mg total) by mouth daily. Patient not taking: Reported on 12/26/2017 04/11/15   Moding, Langley Gauss, MD  potassium chloride 20 MEQ TBCR Take 40 mEq by mouth daily. Patient not taking: Reported on 12/26/2017 04/11/15   Moding, Langley Gauss, MD  sulfamethoxazole-trimethoprim (BACTRIM DS,SEPTRA DS) 800-160 MG per tablet Take 1 tablet by mouth every 12 (twelve) hours. Patient not taking: Reported on 12/26/2017 04/11/15   Moding, Langley Gauss, MD    tiotropium (SPIRIVA) 18 MCG inhalation capsule Place 1 capsule (18 mcg total) into inhaler and inhale daily. Patient not taking: Reported on 12/26/2017 03/14/15   McLean-Scocuzza, Nino Glow, MD    Physical Exam: Vitals:   12/26/17 0859 12/26/17 0908 12/26/17 0930 12/26/17 1130  BP: (!) 133/50  (!) 129/40 (!) 122/55  Pulse: (!) 51  (!) 50 (!) 57  Resp: 18  20 18   Temp: 98.4 F (36.9 C)     TempSrc: Oral     SpO2: 95%  96% 97%  Weight:  66.7 kg (147 lb)    Height:  5\' 2"  (1.575 m)        Constitutional: NAD, calm, comfortable but mildly tachypneic Vitals:   12/26/17 0859 12/26/17 0908 12/26/17 0930 12/26/17 1130  BP: (!) 133/50  (!) 129/40 (!) 122/55  Pulse: (!) 51  (!) 50 (!) 57  Resp: 18  20 18   Temp: 98.4 F (36.9 C)     TempSrc: Oral     SpO2: 95%  96% 97%  Weight:  66.7 kg (147 lb)    Height:  5\' 2"  (1.575 m)     Eyes: PERRL, lids and conjunctivae normal ENMT: Mucous membranes are moist. Posterior pharynx clear of any exudate or lesions.Normal dentition.  Neck: normal, supple, no masses, no thyromegaly Respiratory: Coarse to auscultation bilaterally, mild wheezing, no crackles. Normal respiratory effort. No accessory muscle use.  Cardiovascular: Regular rate and rhythm, no murmurs / rubs / gallops. No extremity edema. 2+ pedal pulses. No carotid bruits.  Abdomen: no tenderness, no masses palpated. No hepatosplenomegaly. Bowel sounds positive.  Musculoskeletal: no clubbing / cyanosis. No joint deformity upper and lower extremities. Good ROM, no contractures. Normal muscle tone.  Skin: no rashes, lesions, ulcers. No induration Neurologic: CN 2-12 grossly intact. Sensation intact, DTR normal. Strength 5/5 in all 4.  Psychiatric: Normal judgment and insight. Alert and oriented x 3. Normal mood.    Labs on Admission: I have personally reviewed following labs and imaging studies  CBC: Recent Labs  Lab 12/26/17 0934 12/26/17 0943  WBC 6.2  --   NEUTROABS 4.3  --   HGB  11.1* 11.9*  HCT 33.4* 35.0*  MCV 85.9  --   PLT 175  --    Basic Metabolic Panel: Recent Labs  Lab 12/26/17 0934 12/26/17 0943  NA 133* 134*  K 3.6 3.9  CL 96* 96*  CO2 24  --   GLUCOSE 118* 113*  BUN 29* 32*  CREATININE 1.15* 1.10*  CALCIUM 8.8*  --   MG 1.8  --   PHOS 3.8  --    GFR: Estimated Creatinine Clearance: 37.7 mL/min (A) (by C-G formula based on SCr of 1.1 mg/dL (H)). Liver Function Tests: Recent Labs  Lab 12/26/17 0934  AST 40  ALT 20  ALKPHOS 65  BILITOT 0.4  PROT 6.7  ALBUMIN 3.5   Recent Labs  Lab 12/26/17 0934  LIPASE 20   No results for input(s): AMMONIA in the last 168 hours. Coagulation Profile: No results for input(s): INR, PROTIME in the last 168 hours. Cardiac Enzymes: No results for input(s): CKTOTAL, CKMB, CKMBINDEX, TROPONINI in the last 168 hours. BNP (last 3 results) No results for input(s): PROBNP in the last 8760 hours. HbA1C: No results for input(s): HGBA1C in the last 72 hours. CBG: No results for input(s): GLUCAP in the last 168 hours. Lipid Profile: No results for input(s): CHOL, HDL, LDLCALC, TRIG, CHOLHDL, LDLDIRECT in the last 72 hours. Thyroid Function Tests: No results for input(s): TSH, T4TOTAL, FREET4, T3FREE, THYROIDAB in the last 72 hours. Anemia Panel: No results for input(s): VITAMINB12, FOLATE, FERRITIN, TIBC, IRON, RETICCTPCT in the last 72 hours. Urine analysis:    Component Value Date/Time   COLORURINE YELLOW 04/11/2015 0210   APPEARANCEUR CLEAR 04/11/2015 0210   LABSPEC 1.009 04/11/2015 0210   PHURINE 6.5 04/11/2015 0210   GLUCOSEU NEGATIVE 04/11/2015 0210   HGBUR NEGATIVE 04/11/2015 0210   BILIRUBINUR NEGATIVE 04/11/2015 0210   KETONESUR NEGATIVE 04/11/2015 0210   PROTEINUR NEGATIVE 04/11/2015 0210   UROBILINOGEN 0.2 04/11/2015 0210   NITRITE NEGATIVE 04/11/2015 0210   LEUKOCYTESUR MODERATE (A) 04/11/2015 0210   Sepsis Labs:  !!!!!!!!!!!!!!!!!!!!!!!!!!!!!!!!!!!!!!!!!!!! @LABRCNTIP (procalcitonin:4,lacticidven:4) )No results found for this or any previous visit (from the past 240 hour(s)).   Radiological Exams on Admission: Ct Angio Chest Pe W And/or Wo Contrast  Result Date: 12/26/2017 CLINICAL DATA:  77 year old female with nausea and vomiting. Shortness of breath and cough. Suspected pulmonary embolus. EXAM: CT ANGIOGRAPHY CHEST WITH CONTRAST TECHNIQUE: Multidetector CT imaging of the chest was performed using the standard protocol during bolus administration of intravenous contrast. Multiplanar CT image reconstructions and MIPs were obtained to evaluate the vascular anatomy. CONTRAST:  114mL ISOVUE-370 IOPAMIDOL (ISOVUE-370) INJECTION 76% COMPARISON:  Portable chest radiograph 0905 hr today. CT Abdomen and Pelvis today reported separately. FINDINGS: Cardiovascular: Excellent contrast bolus timing in the pulmonary arterial tree. Respiratory motion in the lower lobes. No central, hilar, or segmental pulmonary artery filling defect. The bilateral upper lobe pulmonary arteries appear patent. The sub segmental and distal lower lobe pulmonary artery detail is obscured by motion. No filling defect is identified. Mild cardiomegaly. No pericardial effusion. Calcified aortic atherosclerosis. And calcified coronary artery atherosclerosis. Bulky great vessel origin calcified plaque. No definite great vessel origin occlusion. Mediastinum/Nodes: Negative.  No lymphadenopathy. Lungs/Pleura: Atelectatic changes to the trachea and major airways. Scattered bilateral small peripheral and occasionally peribronchial areas of pulmonary ground-glass opacity. The upper lobes are most affected, but there is definite right lower lobe involvement. No consolidation. No pleural effusion. Upper Abdomen: Abdomen findings are reported separately with the CT Abdomen and Pelvis today. Musculoskeletal: Osteopenia and degenerative changes throughout the thoracic  spine. No acute osseous abnormality identified. Review of the MIP images confirms the above findings. IMPRESSION: 1. Distal lower lobe pulmonary artery detail is obscured by respiratory motion, but otherwise negative for acute pulmonary embolus. 2. Scattered bilateral upper lobe and peripheral predominant pulmonary ground-glass opacity is nonspecific. Consider acute bilateral viral/atypical distal airway infection. No pleural effusion. 3. Extensive Aortic Atherosclerosis (ICD10-I70.0) and calcified coronary artery atherosclerosis. 4. See also CT Abdomen and Pelvis today reported separately. Electronically Signed   By: Genevie Ann M.D.   On: 12/26/2017 10:59   Ct Abdomen Pelvis W Contrast  Result Date: 12/26/2017 CLINICAL DATA:  77 year old female with nausea and vomiting. Shortness of breath and  cough. EXAM: CT ABDOMEN AND PELVIS WITH CONTRAST TECHNIQUE: Multidetector CT imaging of the abdomen and pelvis was performed using the standard protocol following bolus administration of intravenous contrast. CONTRAST:  154mL ISOVUE-370 IOPAMIDOL (ISOVUE-370) INJECTION 76% COMPARISON:  Chest CTA today reported separately. FINDINGS: Lower chest: Stable with regard to the chest CTA findings today reported separately, including small areas of peripheral ground-glass opacity in the right lower lobe. Hepatobiliary: Surgically absent gallbladder. Liver and biliary tree within normal limits. Pancreas: Chronic calcific pancreatitis and pancreatic atrophy. Associated dilatation of the main pancreatic duct in the tail. No pancreatic mass or inflammation identified. Spleen: Negative. Adrenals/Urinary Tract: Normal adrenal glands. Bilateral renal contrast enhancement and excretion is symmetric and normal. Renal hilar vascular calcifications suspected, no definite nephrolithiasis. Small left renal midpole simple cyst. No perinephric stranding. Negative ureters. Unremarkable urinary bladder. Stomach/Bowel: Negative rectum. Redundant but  otherwise negative sigmoid colon. Negative left colon and transverse colon. Negative right colon and retrocecal appendix. Negative terminal ileum. No dilated or abnormal small bowel loops identified. The stomach is largely decompressed. Negative duodenum. No abdominal free air or free fluid. Vascular/Lymphatic: Extensive aortoiliac calcified atherosclerosis. Calcified atherosclerosis involving major aortic branches including the distal SMA (series 4, image 34). Despite this, major arterial structures in the abdomen and pelvis remain patent. Portal venous system is patent. No lymphadenopathy. Reproductive: Negative. Other: No pelvic free fluid. Musculoskeletal: Advanced lumbar spine degeneration. No acute osseous abnormality identified. IMPRESSION: 1. No acute or inflammatory process identified in the abdomen or pelvis. 2. Extensive aortoiliac atherosclerosis. The major arterial structures remain patent. 3. Advanced lumbar spine degeneration. Electronically Signed   By: Genevie Ann M.D.   On: 12/26/2017 11:05   Dg Chest Port 1 View  Result Date: 12/26/2017 CLINICAL DATA:  Shortness of breath and cough EXAM: PORTABLE CHEST 1 VIEW COMPARISON:  None. FINDINGS: Lungs are clear. The heart size and pulmonary vascularity are normal. No adenopathy. There is aortic atherosclerosis. No bone lesions. IMPRESSION: Aortic atherosclerosis.  No edema or consolidation. Aortic Atherosclerosis (ICD10-I70.0). Electronically Signed   By: Lowella Grip III M.D.   On: 12/26/2017 09:27    Old chart reviewed Case discussed with EDP Chest x-ray reviewed no edema or infiltrate  Assessment/Plan 77 year old female with acute hypoxic respiratory failure secondary to influenza pneumonia Principal Problem:   Influenza A-continue Tamiflu.  Placed on contact precautions.  Active Problems:   CAP (community acquired pneumonia)-obtain blood cultures and sputum cultures.  Placed on Rocephin and azithromycin.  Patient has not had any  recent antibiotics or hospitalizations.    Acute respiratory failure with hypoxia (HCC)-supplemental oxygen is better.  Patient is improved with 3 L of oxygen per nasal cannula.    Type 2 diabetes mellitus (HCC)-placed on sliding scale insulin.    Essential hypertension-holding her blood pressure meds due to the below    Orthostatic hypotension-IV fluids.  Holding her beta-blocker and ARB.  COPD-placed on frequent nebulizer treatments.  Holding off on steroids at this time.    DVT prophylaxis: SCDs Code Status: Full Family Communication: None Disposition Plan: Per day team Consults called: None Admission status: Admission   Kadeja Granada A MD [facility]  If 7PM-7AM, please contact night-coverage [email or web address] Password [facility]  12/26/2017, 12:14 PM

## 2017-12-26 NOTE — ED Notes (Signed)
Bed: [id number] Expected date:  Expected time:  Means of arrival:  Comments: EMS-N/V

## 2017-12-26 NOTE — ED Triage Notes (Signed)
Per EMS- Patient c/o N/V/D and near syncope x 1 week. Patient had positive ortho changes (128/66-sit, 92/44-standing). Room air sats 80%. Patient placed on O2 4L/min via [place] and sats increased to 95%.

## 2017-12-26 NOTE — Progress Notes (Signed)
Patient is transferred from ED at 1550; alert and oriented x3; vital sign was taken.

## 2017-12-26 NOTE — Progress Notes (Signed)
PHARMACY NOTE:  ANTIMICROBIAL RENAL DOSAGE ADJUSTMENT  Current antimicrobial regimen includes a mismatch between antimicrobial dosage and estimated renal function.  As per policy approved by the Pharmacy & Therapeutics and Medical Executive Committees, the antimicrobial dosage will be adjusted accordingly.  Current antimicrobial dosage:  tamiflu 75mg  BID  Indication: influenza  Renal Function:  Estimated Creatinine Clearance: 37.7 mL/min (A) (by C-G formula based on SCr of 1.1 mg/dL (H)). []      On intermittent HD, scheduled: []      On CRRT    Antimicrobial dosage has been changed to:  tamiflu 30mg  BID  Additional comments:   Thank you for allowing pharmacy to be a part of this patient's care.  Doreene Eland, PharmD, BCPS.   12/26/2017 11:37 AM

## 2017-12-26 NOTE — ED Provider Notes (Signed)
[facility] DEPT Provider Note   CSN: [id number] Arrival date & time: 12/26/17  0845     History   Chief Complaint Chief Complaint  Patient presents with  . Emesis  . Diarrhea  . Near Syncope  . Shortness of Breath    HPI Stefanie Vincent is a 77 y.o. female hx of DM, HTN, here presenting with shortness of breath, flulike syndrome, cough, near syncope, diarrhea.  Patient states that she lives at home with her daughter and her grandson visits occasionally.  He was recently diagnosed with flu and is currently on medicines.  For the last week or so, she has been having nonproductive cough and low-grade temperature.  He has progressive shortness of breath as well.  She also has 2-3 episodes of diarrhea daily that is loose and abdominal pain.  Patient felt nauseated had poor appetite but no vomiting.  Today she felt lightheaded and dizzy like she is on a pass out so her daughter called EMS. Per EMS, she appears dehydrated and was hypoxic to 80% on RA (not on oxygen at home) and orthostatic (BP dropped to 90s from 120s when standing up).   The history is provided by the patient.    Past Medical History:  Diagnosis Date  . Arthritis   . Blood transfusion without reported diagnosis   . COPD (chronic obstructive pulmonary disease) ([place])   . Diabetes mellitus without complication ([place])   . Hypertension   . Stroke [facility])     Patient Active Problem List   Diagnosis Date Noted  . Hypokalemia 04/11/2015  . Near syncope 04/11/2015  . Abdominal pain 04/11/2015  . Hyponatremia 03/22/2015  . Loss of weight 03/22/2015  . COPD exacerbation ([place]) 03/12/2015  . Type 2 diabetes mellitus ([place]) 03/08/2015  . Essential hypertension 03/08/2015    Past Surgical History:  Procedure Laterality Date  . amputation of foot    . CHOLECYSTECTOMY      OB History    No data available       Home Medications    Prior to Admission medications   Medication Sig Start  Date End Date Taking? Authorizing Provider  atenolol (TENORMIN) 50 MG tablet Take 1 tablet (50 mg total) by mouth daily. 04/11/15  Yes Moding, Langley Gauss, MD  cholestyramine Lucrezia Starch) 4 G packet Take 1 packet (4 g total) by mouth 2 (two) times daily. 04/11/15  Yes Moding, Langley Gauss, MD  fluticasone (FLONASE) 50 MCG/ACT nasal spray Place 1 spray into both nostrils daily. 04/11/15  Yes Moding, Langley Gauss, MD  insulin aspart protamine- aspart (NOVOLOG MIX 70/30) (70-30) 100 UNIT/ML injection Inject 0.1 mLs (10 Units total) into the skin 2 (two) times daily with a meal. 03/14/15  Yes Corky Sox, MD  loratadine (CLARITIN) 10 MG tablet Take 10 mg by mouth daily.    Yes [provider]  losartan (COZAAR) 50 MG tablet Take 50 mg by mouth daily. 08/21/17  Yes [provider]  omeprazole (PRILOSEC) 20 MG capsule Take 20 mg by mouth daily as needed (allergies).   Yes [provider]  vitamin D, CHOLECALCIFEROL, 400 UNITS tablet Take 400 Units by mouth daily.   Yes [provider]  albuterol (PROVENTIL HFA;VENTOLIN HFA) 108 (90 BASE) MCG/ACT inhaler Inhale 2 puffs into the lungs every 6 (six) hours as needed for wheezing or shortness of breath (cough, shortness of breath or wheezing.). Patient not taking: Reported on 12/26/2017 03/08/15   Didiano, Alinda Deem, DO  losartan (COZAAR) 25 MG tablet Take 1 tablet (25 mg total) by mouth daily. Patient not taking: Reported on 12/26/2017 04/11/15   Moding, Langley Gauss, MD  potassium chloride 20 MEQ TBCR Take 40 mEq by mouth daily. Patient not taking: Reported on 12/26/2017 04/11/15   Moding, Langley Gauss, MD  rOPINIRole (REQUIP) 0.5 MG tablet Take 0.5 mg by mouth at bedtime as needed (FOR RESTLESS LEGS).    [provider]  simvastatin (ZOCOR) 20 MG tablet Take 20 mg by mouth daily.    [provider]  sulfamethoxazole-trimethoprim (BACTRIM DS,SEPTRA DS) 800-160 MG per tablet Take 1 tablet by mouth every 12 (twelve) hours. Patient not  taking: Reported on 12/26/2017 04/11/15   Moding, Langley Gauss, MD  tiotropium (SPIRIVA) 18 MCG inhalation capsule Place 1 capsule (18 mcg total) into inhaler and inhale daily. Patient not taking: Reported on 12/26/2017 03/14/15   McLean-Scocuzza, Nino Glow, MD    Family History Family History  Problem Relation Age of Onset  . Stroke Mother   . Cancer Father     Social History Social History   Tobacco Use  . Smoking status: Current Every Day Smoker    Packs/day: 1.00    Years: 60.00    Pack years: 60.00    Types: Cigarettes  . Smokeless tobacco: Never Used  . Tobacco comment: Has cut back lately.  Substance Use Topics  . Alcohol use: No    Alcohol/week: 0.0 oz  . Drug use: No     Allergies   Codeine   Review of Systems Review of Systems  Respiratory: Positive for shortness of breath.   Cardiovascular: Positive for near-syncope.  Gastrointestinal: Positive for diarrhea and vomiting.  All other systems reviewed and are negative.    Physical Exam Updated Vital Signs BP (!) 122/55   Pulse (!) 57   Temp 98.4 F (36.9 C) (Oral)   Resp 17   Ht 5\' 2"  (1.575 m)   Wt 66.7 kg (147 lb)   SpO2 94%   BMI 26.89 kg/m   Physical Exam  Constitutional:  Chronically ill, dehydrated   HENT:  Head: Normocephalic.  MM dry   Eyes: EOM are normal. Pupils are equal, round, and reactive to light.  Neck: Normal range of motion.  Cardiovascular: Normal rate.  Pulmonary/Chest:  Mildly wheezing throughout, no retractions.   Abdominal: Soft. Bowel sounds are normal.  + mild epigastric tenderness, no rebound   Musculoskeletal: Normal range of motion.       Right lower leg: Normal.       Left lower leg: Normal.  Neurological: She is alert.  Skin: Skin is warm. Capillary refill takes less than 2 seconds.  Psychiatric: She has a normal mood and affect. Her behavior is normal.  Nursing note and vitals reviewed.    ED Treatments / Results  Labs (all labs ordered are listed, but only  abnormal results are displayed) Labs Reviewed  CBC WITH DIFFERENTIAL/PLATELET - Abnormal; Notable for the following components:      Result Value   Hemoglobin 11.1 (*)    HCT 33.4 (*)    All other components within normal limits  COMPREHENSIVE METABOLIC PANEL - Abnormal; Notable for the following components:   Sodium 133 (*)    Chloride 96 (*)    Glucose, Bld 118 (*)    BUN 29 (*)    Creatinine, Ser 1.15 (*)    Calcium 8.8 (*)    GFR calc non Af Amer 44 (*)  GFR calc Af Amer 51 (*)    All other components within normal limits  INFLUENZA PANEL BY PCR (TYPE A & B) - Abnormal; Notable for the following components:   Influenza A By PCR POSITIVE (*)    All other components within normal limits  I-STAT CHEM 8, ED - Abnormal; Notable for the following components:   Sodium 134 (*)    Chloride 96 (*)    BUN 32 (*)    Creatinine, Ser 1.10 (*)    Glucose, Bld 113 (*)    Hemoglobin 11.9 (*)    HCT 35.0 (*)    All other components within normal limits  CULTURE, BLOOD (ROUTINE X 2)  CULTURE, BLOOD (ROUTINE X 2)  URINE CULTURE  LIPASE, BLOOD  MAGNESIUM  PHOSPHORUS  BLOOD GAS, VENOUS  URINALYSIS, ROUTINE W REFLEX MICROSCOPIC  I-STAT TROPONIN, ED  I-STAT CG4 LACTIC ACID, ED  I-STAT CG4 LACTIC ACID, ED    EKG  EKG Interpretation  Date/Time:  Friday December 26 2017 09:12:41 EST Ventricular Rate:  47 PR Interval:    QRS Duration: 91 QT Interval:  443 QTC Calculation: 392 R Axis:   45 Text Interpretation:  Sinus bradycardia Low voltage, extremity and precordial leads No significant change since last tracing Confirmed by Wandra Arthurs [phone]) on 12/26/2017 10:17:41 AM       Radiology Ct Angio Chest Pe W And/or Wo Contrast  Result Date: 12/26/2017 CLINICAL DATA:  77 year old female with nausea and vomiting. Shortness of breath and cough. Suspected pulmonary embolus. EXAM: CT ANGIOGRAPHY CHEST WITH CONTRAST TECHNIQUE: Multidetector CT imaging of the chest was performed using the  standard protocol during bolus administration of intravenous contrast. Multiplanar CT image reconstructions and MIPs were obtained to evaluate the vascular anatomy. CONTRAST:  135mL ISOVUE-370 IOPAMIDOL (ISOVUE-370) INJECTION 76% COMPARISON:  Portable chest radiograph 0905 hr today. CT Abdomen and Pelvis today reported separately. FINDINGS: Cardiovascular: Excellent contrast bolus timing in the pulmonary arterial tree. Respiratory motion in the lower lobes. No central, hilar, or segmental pulmonary artery filling defect. The bilateral upper lobe pulmonary arteries appear patent. The sub segmental and distal lower lobe pulmonary artery detail is obscured by motion. No filling defect is identified. Mild cardiomegaly. No pericardial effusion. Calcified aortic atherosclerosis. And calcified coronary artery atherosclerosis. Bulky great vessel origin calcified plaque. No definite great vessel origin occlusion. Mediastinum/Nodes: Negative.  No lymphadenopathy. Lungs/Pleura: Atelectatic changes to the trachea and major airways. Scattered bilateral small peripheral and occasionally peribronchial areas of pulmonary ground-glass opacity. The upper lobes are most affected, but there is definite right lower lobe involvement. No consolidation. No pleural effusion. Upper Abdomen: Abdomen findings are reported separately with the CT Abdomen and Pelvis today. Musculoskeletal: Osteopenia and degenerative changes throughout the thoracic spine. No acute osseous abnormality identified. Review of the MIP images confirms the above findings. IMPRESSION: 1. Distal lower lobe pulmonary artery detail is obscured by respiratory motion, but otherwise negative for acute pulmonary embolus. 2. Scattered bilateral upper lobe and peripheral predominant pulmonary ground-glass opacity is nonspecific. Consider acute bilateral viral/atypical distal airway infection. No pleural effusion. 3. Extensive Aortic Atherosclerosis (ICD10-I70.0) and calcified  coronary artery atherosclerosis. 4. See also CT Abdomen and Pelvis today reported separately. Electronically Signed   By: Genevie Ann M.D.   On: 12/26/2017 10:59   Ct Abdomen Pelvis W Contrast  Result Date: 12/26/2017 CLINICAL DATA:  77 year old female with nausea and vomiting. Shortness of breath and cough. EXAM: CT ABDOMEN AND PELVIS WITH CONTRAST TECHNIQUE: Multidetector CT imaging of  the abdomen and pelvis was performed using the standard protocol following bolus administration of intravenous contrast. CONTRAST:  171mL ISOVUE-370 IOPAMIDOL (ISOVUE-370) INJECTION 76% COMPARISON:  Chest CTA today reported separately. FINDINGS: Lower chest: Stable with regard to the chest CTA findings today reported separately, including small areas of peripheral ground-glass opacity in the right lower lobe. Hepatobiliary: Surgically absent gallbladder. Liver and biliary tree within normal limits. Pancreas: Chronic calcific pancreatitis and pancreatic atrophy. Associated dilatation of the main pancreatic duct in the tail. No pancreatic mass or inflammation identified. Spleen: Negative. Adrenals/Urinary Tract: Normal adrenal glands. Bilateral renal contrast enhancement and excretion is symmetric and normal. Renal hilar vascular calcifications suspected, no definite nephrolithiasis. Small left renal midpole simple cyst. No perinephric stranding. Negative ureters. Unremarkable urinary bladder. Stomach/Bowel: Negative rectum. Redundant but otherwise negative sigmoid colon. Negative left colon and transverse colon. Negative right colon and retrocecal appendix. Negative terminal ileum. No dilated or abnormal small bowel loops identified. The stomach is largely decompressed. Negative duodenum. No abdominal free air or free fluid. Vascular/Lymphatic: Extensive aortoiliac calcified atherosclerosis. Calcified atherosclerosis involving major aortic branches including the distal SMA (series 4, image 34). Despite this, major arterial structures  in the abdomen and pelvis remain patent. Portal venous system is patent. No lymphadenopathy. Reproductive: Negative. Other: No pelvic free fluid. Musculoskeletal: Advanced lumbar spine degeneration. No acute osseous abnormality identified. IMPRESSION: 1. No acute or inflammatory process identified in the abdomen or pelvis. 2. Extensive aortoiliac atherosclerosis. The major arterial structures remain patent. 3. Advanced lumbar spine degeneration. Electronically Signed   By: Genevie Ann M.D.   On: 12/26/2017 11:05   Dg Chest Port 1 View  Result Date: 12/26/2017 CLINICAL DATA:  Shortness of breath and cough EXAM: PORTABLE CHEST 1 VIEW COMPARISON:  None. FINDINGS: Lungs are clear. The heart size and pulmonary vascularity are normal. No adenopathy. There is aortic atherosclerosis. No bone lesions. IMPRESSION: Aortic atherosclerosis.  No edema or consolidation. Aortic Atherosclerosis (ICD10-I70.0). Electronically Signed   By: Lowella Grip III M.D.   On: 12/26/2017 09:27    Procedures Procedures (including critical care time)  Medications Ordered in ED Medications  sodium chloride 0.9 % injection (not administered)  cefTRIAXone (ROCEPHIN) 1 g in sodium chloride 0.9 % 100 mL IVPB (1 g Intravenous New Bag/Given 12/26/17 1129)  azithromycin (ZITHROMAX) 500 mg in sodium chloride 0.9 % 250 mL IVPB (not administered)  oseltamivir (TAMIFLU) capsule 30 mg (not administered)  sodium chloride 0.9 % bolus 1,000 mL (1,000 mLs Intravenous New Bag/Given 12/26/17 0946)  ondansetron (ZOFRAN) injection 4 mg (4 mg Intravenous Given 12/26/17 0952)  albuterol (PROVENTIL) (2.5 MG/3ML) 0.083% nebulizer solution 5 mg (5 mg Nebulization Given 12/26/17 0921)  ipratropium (ATROVENT) nebulizer solution 0.5 mg (0.5 mg Nebulization Given 12/26/17 0921)  iopamidol (ISOVUE-370) 76 % injection (100 mLs  Contrast Given 12/26/17 1029)     Initial Impression / Assessment and Plan / ED Course  I have reviewed the triage vital signs and  the nursing notes.  Pertinent labs & imaging results that were available during my care of the patient were reviewed by me and considered in my medical decision making (see chart for details).    Stefanie Vincent is a 77 y.o. female here with hypoxia, orthostasis, abdominal pain, diarrhea. Appears dehydrated, has flu exposure as well. Will do sepsis workup. Will also get CTA to r/o PE and CT ab/pel to assess her abdominal pain. Will hydrate and likely admit given hypoxia.   11:44 AM CTA showed no PE, just there  is some atypical infection. Flu A positive. Started on tamiflu, rocephin, azithromycin. VBG reassuring on 4 L [place]. WBC nl, lactate nl. Hospitalist to admit for CAP, flu A, hypoxia.    Final Clinical Impressions(s) / ED Diagnoses   Final diagnoses:  None    ED Discharge Orders    None       Drenda Freeze, MD 12/26/17 1145

## 2017-12-27 ENCOUNTER — Inpatient Hospital Stay (HOSPITAL_COMMUNITY): Payer: Medicare Other

## 2017-12-27 LAB — URINALYSIS, ROUTINE W REFLEX MICROSCOPIC
BACTERIA UA: NONE SEEN
BILIRUBIN URINE: NEGATIVE
Glucose, UA: 150 mg/dL — AB
Hgb urine dipstick: NEGATIVE
Ketones, ur: NEGATIVE mg/dL
LEUKOCYTES UA: NEGATIVE
NITRITE: NEGATIVE
PH: 5 (ref 5.0–8.0)
Protein, ur: 100 mg/dL — AB
Specific Gravity, Urine: 1.025 (ref 1.005–1.030)

## 2017-12-27 LAB — CBC WITH DIFFERENTIAL/PLATELET
BASOS PCT: 1 %
Basophils Absolute: 0 10*3/uL (ref 0.0–0.1)
Eosinophils Absolute: 0 10*3/uL (ref 0.0–0.7)
Eosinophils Relative: 0 %
HEMATOCRIT: 30.8 % — AB (ref 36.0–46.0)
Hemoglobin: 10.2 g/dL — ABNORMAL LOW (ref 12.0–15.0)
Lymphocytes Relative: 20 %
Lymphs Abs: 1.1 10*3/uL (ref 0.7–4.0)
MCH: 29.3 pg (ref 26.0–34.0)
MCHC: 33.1 g/dL (ref 30.0–36.0)
MCV: 88.5 fL (ref 78.0–100.0)
MONO ABS: 0.5 10*3/uL (ref 0.1–1.0)
MONOS PCT: 9 %
NEUTROS ABS: 4.1 10*3/uL (ref 1.7–7.7)
Neutrophils Relative %: 70 %
Platelets: 173 10*3/uL (ref 150–400)
RBC: 3.48 MIL/uL — ABNORMAL LOW (ref 3.87–5.11)
RDW: 14.6 % (ref 11.5–15.5)
WBC: 5.8 10*3/uL (ref 4.0–10.5)

## 2017-12-27 LAB — BASIC METABOLIC PANEL
ANION GAP: 9 (ref 5–15)
BUN: 20 mg/dL (ref 6–20)
CALCIUM: 8.3 mg/dL — AB (ref 8.9–10.3)
CO2: 24 mmol/L (ref 22–32)
Chloride: 103 mmol/L (ref 101–111)
Creatinine, Ser: 0.96 mg/dL (ref 0.44–1.00)
GFR calc Af Amer: >60 mL/min (ref >60)
GFR calc non Af Amer: 55 mL/min — ABNORMAL LOW (ref >60)
Glucose, Bld: 96 mg/dL (ref 65–99)
Potassium: 3.6 mmol/L (ref 3.5–5.1)
Sodium: 136 mmol/L (ref 135–145)

## 2017-12-27 LAB — GLUCOSE, CAPILLARY
GLUCOSE-CAPILLARY: 102 mg/dL — AB (ref 65–99)
GLUCOSE-CAPILLARY: 93 mg/dL (ref 65–99)
GLUCOSE-CAPILLARY: 236 mg/dL — AB (ref 65–99)
GLUCOSE-CAPILLARY: 249 mg/dL — AB (ref 65–99)

## 2017-12-27 LAB — BRAIN NATRIURETIC PEPTIDE: B Natriuretic Peptide: 261 pg/mL — ABNORMAL HIGH (ref 0.0–100.0)

## 2017-12-27 LAB — STREP PNEUMONIAE URINARY ANTIGEN: Strep Pneumo Urinary Antigen: NEGATIVE

## 2017-12-27 MED ORDER — POLYETHYLENE GLYCOL 3350 17 G PO PACK
17.0000 g | PACK | Freq: Every day | ORAL | Status: DC | PRN
  Administered 2017-12-27 – 2017-12-31 (×2): 17 g via ORAL
  Filled 2017-12-27 (×2): qty 1

## 2017-12-27 MED ORDER — FUROSEMIDE 10 MG/ML IJ SOLN
40.0000 mg | Freq: Once | INTRAMUSCULAR | Status: AC
  Administered 2017-12-27: 40 mg via INTRAVENOUS
  Filled 2017-12-27: qty 4

## 2017-12-27 MED ORDER — METHYLPREDNISOLONE SODIUM SUCC 125 MG IJ SOLR
60.0000 mg | Freq: Four times a day (QID) | INTRAMUSCULAR | Status: DC
  Administered 2017-12-27 – 2017-12-28 (×5): 60 mg via INTRAVENOUS
  Filled 2017-12-27 (×5): qty 2

## 2017-12-27 MED ORDER — BISACODYL 10 MG RE SUPP: 10.0000 mg | Freq: Every day | RECTAL | Status: DC | PRN

## 2017-12-27 MED ORDER — ONDANSETRON HCL 4 MG/2ML IJ SOLN: 4.0000 mg | Freq: Four times a day (QID) | INTRAMUSCULAR | Status: DC | PRN

## 2017-12-27 NOTE — Progress Notes (Addendum)
[facility]  PROGRESS NOTE  Stefanie Vincent KKX:[id number] DOB: 03-Oct-1941 DOA: 12/26/2017 PCP: Helane Rima, MD   Brief HPI:    77 y.o. female with medical history significant of COPD, hypertension comes in with a week of coughing progressive worsening shortness of breath.  Her grandson who lives with her as been recently diagnosed with the flu.  She been having a lot of nasal congestion Patient is found to have influenza and pneumonia.   Subjective   Patient seen and examined, breathing has improved.   Assessment/Plan:     1. Community acquired pneumonia-continue Rocephin, Zithromax, follow blood culture results. 2. Influenza A-continue Tamiflu 3. Acute hypoxic respiratory failure-secondary to community acquired pneumonia, influenza A patient requiring 3 L of oxygen via nasal cannula. Diabetes mellitus-continue sliding scale insulin with NovoLog/  CBG (last 3)  Recent Labs    12/26/17 2148 12/27/17 0736 12/27/17 1124  GLUCAP 109* 102* 93  4.  5. Essential hypertension-antihypertensive medications on hold due to orthostatic hypotension, continue IV fluids. Will restart antihypertensive meds once patient blood pressure improves. 6. COPD exacerbation-continue albuterol/Atrovent nebulizer Q6 hours, will start Solu-Medrol 60 mg IV Q6 hours. 7. Constipation-Dulcolax suppository PRN    DVT prophylaxis: *SCD's  Code Status: Full code  Family Communication: No family at bedside  Disposition Plan: likely home when medically ready for discharge   Consultants:  none  Procedures:  none   Antibiotics:   Anti-infectives (From admission, onward)   Start     Dose/Rate Route Frequency Ordered Stop   12/27/17 1000  cefTRIAXone (ROCEPHIN) 1 g in sodium chloride 0.9 % 100 mL IVPB     1 g 200 mL/hr over 30 Minutes Intravenous Every 24 hours 12/26/17 1217 01/03/18 0959   12/27/17 1000  azithromycin (ZITHROMAX) 500 mg in sodium chloride 0.9 % 250 mL IVPB     500  mg 250 mL/hr over 60 Minutes Intravenous Every 24 hours 12/26/17 1217 01/03/18 0959   12/26/17 1230  oseltamivir (TAMIFLU) capsule 30 mg     30 mg Oral 2 times daily 12/26/17 1134 12/31/17 0959   12/26/17 1115  cefTRIAXone (ROCEPHIN) 1 g in sodium chloride 0.9 % 100 mL IVPB     1 g 200 mL/hr over 30 Minutes Intravenous  Once 12/26/17 1106 12/26/17 1205   12/26/17 1115  azithromycin (ZITHROMAX) 500 mg in sodium chloride 0.9 % 250 mL IVPB     500 mg 250 mL/hr over 60 Minutes Intravenous  Once 12/26/17 1106 12/26/17 1314       Objective   Vitals:   12/26/17 2150 12/27/17 0209 12/27/17 0607 12/27/17 0923  BP: (!) 137/42  (!) 137/53   Pulse: 66  61   Resp: 12  14   Temp: 98.2 F (36.8 C)  98.6 F (37 C)   TempSrc: Oral  Oral   SpO2: 95% 95% 95% (!) 87%  Weight:      Height:        Intake/Output Summary (Last 24 hours) at 12/27/2017 1122 Last data filed at 12/27/2017 1696 Gross per 24 hour  Intake 2621.25 ml  Output 400 ml  Net 2221.25 ml   Filed Weights   12/26/17 0908  Weight: 66.7 kg (147 lb)     Physical Examination:   Physical Exam: Eyes: No icterus, extraocular muscles intact  Mouth: Oral mucosa is moist, no lesions on palate,  Neck: Supple, no deformities, masses, or tenderness Lungs: Normal respiratory effort, bilateral rhonchi auscultated Heart: Regular rate and rhythm, S1 and  S2 normal, no murmurs, rubs auscultated Abdomen: BS normoactive,soft,nondistended,non-tender to palpation,no organomegaly Extremities: No pretibial edema, no erythema, no cyanosis, no clubbing Neuro : Alert and oriented to time, place and person, No focal deficits Skin: No rashes seen on exam     Data Reviewed: I have personally reviewed following labs and imaging studies  CBG: Recent Labs  Lab 12/26/17 1728 12/26/17 2148 12/27/17 0736  GLUCAP 99 109* 102*    CBC: Recent Labs  Lab 12/26/17 0934 12/26/17 0943 12/27/17 0625  WBC 6.2  --  5.8  NEUTROABS 4.3  --  4.1   HGB 11.1* 11.9* 10.2*  HCT 33.4* 35.0* 30.8*  MCV 85.9  --  88.5  PLT 175  --  154    Basic Metabolic Panel: Recent Labs  Lab 12/26/17 0934 12/26/17 0943 12/27/17 0625  NA 133* 134* 136  K 3.6 3.9 3.6  CL 96* 96* 103  CO2 24  --  24  GLUCOSE 118* 113* 96  BUN 29* 32* 20  CREATININE 1.15* 1.10* 0.96  CALCIUM 8.8*  --  8.3*  MG 1.8  --   --   PHOS 3.8  --   --     No results found for this or any previous visit (from the past 240 hour(s)).   Liver Function Tests: Recent Labs  Lab 12/26/17 0934  AST 40  ALT 20  ALKPHOS 65  BILITOT 0.4  PROT 6.7  ALBUMIN 3.5   Recent Labs  Lab 12/26/17 0934  LIPASE 20   No results for input(s): AMMONIA in the last 168 hours.  Cardiac Enzymes: No results for input(s): CKTOTAL, CKMB, CKMBINDEX, TROPONINI in the last 168 hours. BNP (last 3 results) No results for input(s): BNP in the last 8760 hours.  ProBNP (last 3 results) No results for input(s): PROBNP in the last 8760 hours.    Studies: Ct Angio Chest Pe W And/or Wo Contrast  Result Date: 12/26/2017 CLINICAL DATA:  77 year old female with nausea and vomiting. Shortness of breath and cough. Suspected pulmonary embolus. EXAM: CT ANGIOGRAPHY CHEST WITH CONTRAST TECHNIQUE: Multidetector CT imaging of the chest was performed using the standard protocol during bolus administration of intravenous contrast. Multiplanar CT image reconstructions and MIPs were obtained to evaluate the vascular anatomy. CONTRAST:  149mL ISOVUE-370 IOPAMIDOL (ISOVUE-370) INJECTION 76% COMPARISON:  Portable chest radiograph 0905 hr today. CT Abdomen and Pelvis today reported separately. FINDINGS: Cardiovascular: Excellent contrast bolus timing in the pulmonary arterial tree. Respiratory motion in the lower lobes. No central, hilar, or segmental pulmonary artery filling defect. The bilateral upper lobe pulmonary arteries appear patent. The sub segmental and distal lower lobe pulmonary artery detail is  obscured by motion. No filling defect is identified. Mild cardiomegaly. No pericardial effusion. Calcified aortic atherosclerosis. And calcified coronary artery atherosclerosis. Bulky great vessel origin calcified plaque. No definite great vessel origin occlusion. Mediastinum/Nodes: Negative.  No lymphadenopathy. Lungs/Pleura: Atelectatic changes to the trachea and major airways. Scattered bilateral small peripheral and occasionally peribronchial areas of pulmonary ground-glass opacity. The upper lobes are most affected, but there is definite right lower lobe involvement. No consolidation. No pleural effusion. Upper Abdomen: Abdomen findings are reported separately with the CT Abdomen and Pelvis today. Musculoskeletal: Osteopenia and degenerative changes throughout the thoracic spine. No acute osseous abnormality identified. Review of the MIP images confirms the above findings. IMPRESSION: 1. Distal lower lobe pulmonary artery detail is obscured by respiratory motion, but otherwise negative for acute pulmonary embolus. 2. Scattered bilateral upper lobe and peripheral  predominant pulmonary ground-glass opacity is nonspecific. Consider acute bilateral viral/atypical distal airway infection. No pleural effusion. 3. Extensive Aortic Atherosclerosis (ICD10-I70.0) and calcified coronary artery atherosclerosis. 4. See also CT Abdomen and Pelvis today reported separately. Electronically Signed   By: Genevie Ann M.D.   On: 12/26/2017 10:59   Ct Abdomen Pelvis W Contrast  Result Date: 12/26/2017 CLINICAL DATA:  77 year old female with nausea and vomiting. Shortness of breath and cough. EXAM: CT ABDOMEN AND PELVIS WITH CONTRAST TECHNIQUE: Multidetector CT imaging of the abdomen and pelvis was performed using the standard protocol following bolus administration of intravenous contrast. CONTRAST:  133mL ISOVUE-370 IOPAMIDOL (ISOVUE-370) INJECTION 76% COMPARISON:  Chest CTA today reported separately. FINDINGS: Lower chest: Stable  with regard to the chest CTA findings today reported separately, including small areas of peripheral ground-glass opacity in the right lower lobe. Hepatobiliary: Surgically absent gallbladder. Liver and biliary tree within normal limits. Pancreas: Chronic calcific pancreatitis and pancreatic atrophy. Associated dilatation of the main pancreatic duct in the tail. No pancreatic mass or inflammation identified. Spleen: Negative. Adrenals/Urinary Tract: Normal adrenal glands. Bilateral renal contrast enhancement and excretion is symmetric and normal. Renal hilar vascular calcifications suspected, no definite nephrolithiasis. Small left renal midpole simple cyst. No perinephric stranding. Negative ureters. Unremarkable urinary bladder. Stomach/Bowel: Negative rectum. Redundant but otherwise negative sigmoid colon. Negative left colon and transverse colon. Negative right colon and retrocecal appendix. Negative terminal ileum. No dilated or abnormal small bowel loops identified. The stomach is largely decompressed. Negative duodenum. No abdominal free air or free fluid. Vascular/Lymphatic: Extensive aortoiliac calcified atherosclerosis. Calcified atherosclerosis involving major aortic branches including the distal SMA (series 4, image 34). Despite this, major arterial structures in the abdomen and pelvis remain patent. Portal venous system is patent. No lymphadenopathy. Reproductive: Negative. Other: No pelvic free fluid. Musculoskeletal: Advanced lumbar spine degeneration. No acute osseous abnormality identified. IMPRESSION: 1. No acute or inflammatory process identified in the abdomen or pelvis. 2. Extensive aortoiliac atherosclerosis. The major arterial structures remain patent. 3. Advanced lumbar spine degeneration. Electronically Signed   By: Genevie Ann M.D.   On: 12/26/2017 11:05   Dg Chest Port 1 View  Result Date: 12/26/2017 CLINICAL DATA:  Shortness of breath and cough EXAM: PORTABLE CHEST 1 VIEW COMPARISON:   None. FINDINGS: Lungs are clear. The heart size and pulmonary vascularity are normal. No adenopathy. There is aortic atherosclerosis. No bone lesions. IMPRESSION: Aortic atherosclerosis.  No edema or consolidation. Aortic Atherosclerosis (ICD10-I70.0). Electronically Signed   By: Lowella Grip III M.D.   On: 12/26/2017 09:27    Scheduled Meds: . aspirin  81 mg Oral Daily  . cholecalciferol  400 Units Oral Daily  . cholestyramine  4 g Oral 2 times per day  . insulin aspart  0-9 Units Subcutaneous TID WC  . insulin aspart protamine- aspart  10 Units Subcutaneous BID WC  . ipratropium-albuterol  3 mL Nebulization Q6H  . loratadine  10 mg Oral Daily  . oseltamivir  30 mg Oral BID  . pravastatin  40 mg Oral Daily      Time spent: 25 minutes  [facility] Pager [phone]. If 7PM-7AM, please contact night-coverage at [email or web address], Office  [phone]  password [email or web address]  12/27/2017, 11:22 AM  LOS: 1 day

## 2017-12-28 DIAGNOSIS — J810 Acute pulmonary edema: Secondary | ICD-10-CM

## 2017-12-28 LAB — BASIC METABOLIC PANEL
ANION GAP: 12 (ref 5–15)
BUN: 17 mg/dL (ref 6–20)
CHLORIDE: 98 mmol/L — AB (ref 101–111)
CO2: 25 mmol/L (ref 22–32)
Calcium: 8.9 mg/dL (ref 8.9–10.3)
Creatinine, Ser: 0.8 mg/dL (ref 0.44–1.00)
GFR calc Af Amer: >60 mL/min (ref >60)
GFR calc non Af Amer: >60 mL/min (ref >60)
Glucose, Bld: 313 mg/dL — ABNORMAL HIGH (ref 65–99)
POTASSIUM: 3.7 mmol/L (ref 3.5–5.1)
Sodium: 135 mmol/L (ref 135–145)

## 2017-12-28 LAB — CBC
HEMATOCRIT: 35.6 % — AB (ref 36.0–46.0)
HEMOGLOBIN: 11.8 g/dL — AB (ref 12.0–15.0)
MCH: 28.2 pg (ref 26.0–34.0)
MCHC: 33.1 g/dL (ref 30.0–36.0)
MCV: 85 fL (ref 78.0–100.0)
Platelets: 186 10*3/uL (ref 150–400)
RBC: 4.19 MIL/uL (ref 3.87–5.11)
RDW: 13.6 % (ref 11.5–15.5)
WBC: 3.6 10*3/uL — AB (ref 4.0–10.5)

## 2017-12-28 LAB — GLUCOSE, CAPILLARY
GLUCOSE-CAPILLARY: 306 mg/dL — AB (ref 65–99)
GLUCOSE-CAPILLARY: 321 mg/dL — AB (ref 65–99)
Glucose-Capillary: 293 mg/dL — ABNORMAL HIGH (ref 65–99)
Glucose-Capillary: 344 mg/dL — ABNORMAL HIGH (ref 65–99)

## 2017-12-28 MED ORDER — IPRATROPIUM-ALBUTEROL 0.5-2.5 (3) MG/3ML IN SOLN
3.0000 mL | Freq: Four times a day (QID) | RESPIRATORY_TRACT | Status: DC
  Administered 2017-12-28 – 2017-12-29 (×5): 3 mL via RESPIRATORY_TRACT
  Filled 2017-12-28 (×6): qty 3

## 2017-12-28 MED ORDER — METHYLPREDNISOLONE SODIUM SUCC 40 MG IJ SOLR
40.0000 mg | Freq: Three times a day (TID) | INTRAMUSCULAR | Status: DC
  Administered 2017-12-28 – 2017-12-30 (×6): 40 mg via INTRAVENOUS
  Filled 2017-12-28 (×5): qty 1

## 2017-12-28 MED ORDER — HYDRALAZINE HCL 25 MG PO TABS
25.0000 mg | ORAL_TABLET | Freq: Four times a day (QID) | ORAL | Status: DC | PRN
  Administered 2017-12-30 – 2017-12-31 (×3): 25 mg via ORAL
  Filled 2017-12-28 (×3): qty 1

## 2017-12-28 MED ORDER — AZITHROMYCIN 250 MG PO TABS
500.0000 mg | ORAL_TABLET | Freq: Every day | ORAL | Status: DC
  Administered 2017-12-29 – 2017-12-31 (×3): 500 mg via ORAL
  Filled 2017-12-28 (×3): qty 2

## 2017-12-28 MED ORDER — ENOXAPARIN SODIUM 40 MG/0.4ML ~~LOC~~ SOLN
40.0000 mg | SUBCUTANEOUS | Status: DC
  Administered 2017-12-28 – 2018-01-02 (×6): 40 mg via SUBCUTANEOUS
  Filled 2017-12-28 (×6): qty 0.4

## 2017-12-28 MED ORDER — FUROSEMIDE 10 MG/ML IJ SOLN
20.0000 mg | Freq: Two times a day (BID) | INTRAMUSCULAR | Status: DC
  Administered 2017-12-28 – 2017-12-31 (×7): 20 mg via INTRAVENOUS
  Filled 2017-12-28 (×7): qty 2

## 2017-12-28 NOTE — Progress Notes (Signed)
Patient 95% on 10 L HFNC. Decreased to 8L at this time. Patient has a continous pulse ox. RT will continue to monitor and wean as tolerated.

## 2017-12-28 NOTE — Progress Notes (Signed)
PHARMACIST - PHYSICIAN COMMUNICATION CONCERNING: Antibiotic IV to Oral Route Change Policy  RECOMMENDATION: This patient is receiving azithromycin by the intravenous route.  Based on criteria approved by the Pharmacy and Therapeutics Committee, the antibiotic(s) is/are being converted to the equivalent oral dose form(s).   DESCRIPTION: These criteria include:  Patient being treated for a respiratory tract infection, urinary tract infection, cellulitis or clostridium difficile associated diarrhea if on metronidazole  The patient is not neutropenic and does not exhibit a GI malabsorption state  The patient is eating (either orally or via tube) and/or has been taking other orally administered medications for a least 24 hours  The patient is improving clinically and has a Tmax < 100.5  If you have questions about this conversion, please contact the Pharmacy Department  []   [phone] )  [facility] []   [phone] )  [facility] []   [phone] )  [facility] []   [phone] )  [facility] [x]   [phone] )  [place], [place].D. [phone] 12/28/2017 1:15 PM

## 2017-12-28 NOTE — Progress Notes (Signed)
[facility]  PROGRESS NOTE  Stefanie Vincent UXN:[id number] DOB: 06/04/1941 DOA: 12/26/2017 PCP: Helane Rima, MD   Brief HPI:    77 y.o. female with medical history significant of COPD, hypertension comes in with a week of coughing progressive worsening shortness of breath.  Her grandson who lives with her as been recently diagnosed with the flu.  She been having a lot of nasal congestion Patient is found to have influenza and pneumonia.   Subjective   Patient seen and examined, now requiring 9 L of oxygen via nasal cannula. Good diuresis with IV Lasix yesterday.   Assessment/Plan:     1. Community acquired pneumonia-continue Rocephin, Zithromax, follow blood culture results. 2. Pulmonary edema/fluid overload-patient has significant crackles bilaterally, good diuresis with IV Lasix yesterday. Will start Lasix 20 mg IV Q 12 hours, insert Foley catheter or strict intake and output. Follow BMP in am. 3. Influenza A-continue Tamiflu 4. Acute hypoxic respiratory failure-secondary to community acquired pneumonia, influenza A patient requiring 9 L of oxygen via nasal cannula. Diabetes mellitus-continue sliding scale insulin with NovoLog/will cut down the Solu-Medrol to 40 mg IV Q8 hours due to elevated blood glucose. CBG (last 3)  Recent Labs    12/27/17 2040 12/28/17 0744 12/28/17 1122  GLUCAP 249* 293* 344*   5. Essential hypertension-antihypertensive medications on hold due to orthostatic hypotension, continue IV fluids. Will restart antihypertensive meds once patient blood pressure improves. 6. COPD exacerbation-continue albuterol/Atrovent nebulizer Q6 hours, will change Solu-Medrol to 40 mg IV Q8 hours.  7. Constipation-Dulcolax suppository PRN    DVT prophylaxis: Lovenox  Code Status: Full code  Family Communication: No family at bedside  Disposition Plan: likely home when medically ready for  discharge   Consultants:  none  Procedures:  none   Antibiotics:   Anti-infectives (From admission, onward)   Start     Dose/Rate Route Frequency Ordered Stop   12/29/17 1000  azithromycin (ZITHROMAX) tablet 500 mg     500 mg Oral Daily 12/28/17 1314 01/02/18 0959   12/27/17 1000  cefTRIAXone (ROCEPHIN) 1 g in sodium chloride 0.9 % 100 mL IVPB     1 g 200 mL/hr over 30 Minutes Intravenous Every 24 hours 12/26/17 1217 01/03/18 0959   12/27/17 1000  azithromycin (ZITHROMAX) 500 mg in sodium chloride 0.9 % 250 mL IVPB  Status:  Discontinued     500 mg 250 mL/hr over 60 Minutes Intravenous Every 24 hours 12/26/17 1217 12/28/17 1314   12/26/17 1230  oseltamivir (TAMIFLU) capsule 30 mg     30 mg Oral 2 times daily 12/26/17 1134 12/31/17 0959   12/26/17 1115  cefTRIAXone (ROCEPHIN) 1 g in sodium chloride 0.9 % 100 mL IVPB     1 g 200 mL/hr over 30 Minutes Intravenous  Once 12/26/17 1106 12/26/17 1205   12/26/17 1115  azithromycin (ZITHROMAX) 500 mg in sodium chloride 0.9 % 250 mL IVPB     500 mg 250 mL/hr over 60 Minutes Intravenous  Once 12/26/17 1106 12/26/17 1314       Objective   Vitals:   12/27/17 1551 12/27/17 2138 12/28/17 0423 12/28/17 0745  BP:   (!) 180/57   Pulse:   69   Resp:   18   Temp:   97.8 F (36.6 C)   TempSrc:   Oral   SpO2: 93% 95% 96% 93%  Weight:      Height:        Intake/Output Summary (Last 24 hours) at 12/28/2017 1347 Last  data filed at 12/28/2017 1118 Gross per 24 hour  Intake 948.08 ml  Output 1250 ml  Net -301.92 ml   Filed Weights   12/26/17 0908  Weight: 66.7 kg (147 lb)     Physical Examination:   Physical Exam: Eyes: No icterus, extraocular muscles intact  Mouth: Oral mucosa is moist, no lesions on palate,  Neck: Supple, no deformities, masses, or tenderness Lungs: Normal respiratory effort, bilateral crackles at the lung basis. Heart: Regular rate and rhythm, S1 and S2 normal, no murmurs, rubs auscultated Abdomen: BS  normoactive,soft,nondistended,non-tender to palpation,no organomegaly Extremities: No pretibial edema, no erythema, no cyanosis, no clubbing Neuro : Alert and oriented to time, place and person, No focal deficits Skin: No rashes seen on exam     Data Reviewed: I have personally reviewed following labs and imaging studies  CBG: Recent Labs  Lab 12/27/17 1124 12/27/17 1701 12/27/17 2040 12/28/17 0744 12/28/17 1122  GLUCAP 93 236* 249* 293* 344*    CBC: Recent Labs  Lab 12/26/17 0934 12/26/17 0943 12/27/17 0625 12/28/17 0621  WBC 6.2  --  5.8 3.6*  NEUTROABS 4.3  --  4.1  --   HGB 11.1* 11.9* 10.2* 11.8*  HCT 33.4* 35.0* 30.8* 35.6*  MCV 85.9  --  88.5 85.0  PLT 175  --  173 130    Basic Metabolic Panel: Recent Labs  Lab 12/26/17 0934 12/26/17 0943 12/27/17 0625 12/28/17 0621  NA 133* 134* 136 135  K 3.6 3.9 3.6 3.7  CL 96* 96* 103 98*  CO2 24  --  24 25  GLUCOSE 118* 113* 96 313*  BUN 29* 32* 20 17  CREATININE 1.15* 1.10* 0.96 0.80  CALCIUM 8.8*  --  8.3* 8.9  MG 1.8  --   --   --   PHOS 3.8  --   --   --     Recent Results (from the past 240 hour(s))  Blood culture (routine x 2)     Status: None (Preliminary result)   Collection Time: 12/26/17  9:35 AM  Result Value Ref Range Status   Specimen Description   Final    BLOOD LEFT ANTECUBITAL Performed at [facility], [place] [address] [place], [place] [address]    Special Requests   Final    BOTTLES DRAWN AEROBIC AND ANAEROBIC Blood Culture adequate volume Performed at [facility] [address] [place], [place] [address]    Culture   Final    NO GROWTH 1 DAY Performed at [facility], [place] [address] [place], [place] [address]    Report Status PENDING  Incomplete  Blood culture (routine x 2)     Status: None (Preliminary result)   Collection Time: 12/26/17  9:36 AM  Result Value Ref Range Status   Specimen Description   Final    BLOOD RIGHT  ANTECUBITAL Performed at [place] [address], [place], [place] [address]    Special Requests   Final    BOTTLES DRAWN AEROBIC AND ANAEROBIC Blood Culture adequate volume Performed at [place] [address] [place], [place] [address]    Culture   Final    NO GROWTH 1 DAY Performed at [facility], [place] [address] [place], [place] [address]    Report Status PENDING  Incomplete     Liver Function Tests: Recent Labs  Lab 12/26/17 0934  AST 40  ALT 20  ALKPHOS 65  BILITOT 0.4  PROT 6.7  ALBUMIN 3.5   Recent Labs  Lab 12/26/17 0934  LIPASE 20   No results for input(s): AMMONIA in the last 168 hours.  Cardiac Enzymes: No results for input(s): CKTOTAL, CKMB, CKMBINDEX, TROPONINI in the last 168 hours. BNP (last 3 results) Recent Labs    12/27/17 1655  BNP 261.0*    ProBNP (last 3 results) No results for input(s): PROBNP in the last 8760 hours.    Studies: Dg Chest Port 1 View  Result Date: 12/27/2017 CLINICAL DATA:  Dyspnea EXAM: PORTABLE CHEST 1 VIEW COMPARISON:  12/26/2017 chest radiograph. FINDINGS: Stable cardiomediastinal silhouette with normal heart size. No pneumothorax. No pleural effusion. No overt pulmonary edema. Mild hazy reticular opacities at the lung bases. No acute consolidative airspace disease. IMPRESSION: No acute consolidative airspace disease. Mild hazy reticular bibasilar lung opacities, favor atelectasis or bronchiolitis. Electronically Signed   By: Ilona Sorrel M.D.   On: 12/27/2017 16:58    Scheduled Meds: . aspirin  81 mg Oral Daily  . [START ON 12/29/2017] azithromycin  500 mg Oral Daily  . cholecalciferol  400 Units Oral Daily  . cholestyramine  4 g Oral 2 times per day  . furosemide  20 mg Intravenous Q12H  . insulin aspart  0-9 Units Subcutaneous TID WC  . insulin aspart protamine- aspart  10 Units Subcutaneous BID WC  . ipratropium-albuterol  3 mL Nebulization QID  .  loratadine  10 mg Oral Daily  . methylPREDNISolone (SOLU-MEDROL) injection  60 mg Intravenous Q6H  . oseltamivir  30 mg Oral BID  . pravastatin  40 mg Oral Daily      Time spent: 25 minutes  [facility] Pager [phone]. If 7PM-7AM, please contact night-coverage at [email or web address], Office  [phone]  password [email or web address]  12/28/2017, 1:47 PM  LOS: 2 days

## 2017-12-29 LAB — BASIC METABOLIC PANEL
Anion gap: 14 (ref 5–15)
BUN: 22 mg/dL — ABNORMAL HIGH (ref 6–20)
CHLORIDE: 95 mmol/L — AB (ref 101–111)
CO2: 26 mmol/L (ref 22–32)
Calcium: 9 mg/dL (ref 8.9–10.3)
Creatinine, Ser: 0.79 mg/dL (ref 0.44–1.00)
GFR calc non Af Amer: >60 mL/min (ref >60)
Glucose, Bld: 293 mg/dL — ABNORMAL HIGH (ref 65–99)
Potassium: 3.4 mmol/L — ABNORMAL LOW (ref 3.5–5.1)
Sodium: 135 mmol/L (ref 135–145)

## 2017-12-29 LAB — GLUCOSE, CAPILLARY
GLUCOSE-CAPILLARY: 276 mg/dL — AB (ref 65–99)
Glucose-Capillary: 266 mg/dL — ABNORMAL HIGH (ref 65–99)
Glucose-Capillary: 305 mg/dL — ABNORMAL HIGH (ref 65–99)
Glucose-Capillary: 298 mg/dL — ABNORMAL HIGH (ref 65–99)

## 2017-12-29 LAB — URINE CULTURE: Culture: NO GROWTH

## 2017-12-29 MED ORDER — ATENOLOL 25 MG PO TABS
50.0000 mg | ORAL_TABLET | Freq: Every day | ORAL | Status: DC
  Administered 2017-12-29 – 2018-01-02 (×5): 50 mg via ORAL
  Filled 2017-12-29 (×6): qty 2

## 2017-12-29 MED ORDER — INSULIN ASPART 100 UNIT/ML ~~LOC~~ SOLN
0.0000 [IU] | Freq: Three times a day (TID) | SUBCUTANEOUS | Status: DC
  Administered 2017-12-29 – 2017-12-30 (×4): 8 [IU] via SUBCUTANEOUS
  Administered 2017-12-31: 11 [IU] via SUBCUTANEOUS
  Administered 2017-12-31: 8 [IU] via SUBCUTANEOUS
  Administered 2017-12-31 – 2018-01-01 (×2): 5 [IU] via SUBCUTANEOUS
  Administered 2018-01-02: 3 [IU] via SUBCUTANEOUS

## 2017-12-29 MED ORDER — INSULIN ASPART PROT & ASPART (70-30 MIX) 100 UNIT/ML ~~LOC~~ SUSP
15.0000 [IU] | Freq: Two times a day (BID) | SUBCUTANEOUS | Status: DC
  Administered 2017-12-29 – 2017-12-31 (×4): 15 [IU] via SUBCUTANEOUS
  Filled 2017-12-29: qty 10

## 2017-12-29 MED ORDER — POTASSIUM CHLORIDE CRYS ER 20 MEQ PO TBCR
40.0000 meq | EXTENDED_RELEASE_TABLET | Freq: Once | ORAL | Status: AC
  Administered 2017-12-29: 40 meq via ORAL
  Filled 2017-12-29: qty 2

## 2017-12-29 MED ORDER — IPRATROPIUM-ALBUTEROL 0.5-2.5 (3) MG/3ML IN SOLN
3.0000 mL | Freq: Three times a day (TID) | RESPIRATORY_TRACT | Status: DC
  Administered 2017-12-29 – 2018-01-02 (×12): 3 mL via RESPIRATORY_TRACT
  Filled 2017-12-29 (×12): qty 3

## 2017-12-29 NOTE — Progress Notes (Signed)
Inpatient Diabetes Program Recommendations  AACE/ADA: New Consensus Statement on Inpatient Glycemic Control (2015)  Target Ranges:  Prepandial:   less than 140 mg/dL      Peak postprandial:   less than 180 mg/dL (1-2 hours)      Critically ill patients:  140 - 180 mg/dL   Results for Stefanie Vincent, Stefanie Vincent (MRN [id number]) as of 12/29/2017 14:58  Ref. Range 12/28/2017 07:44 12/28/2017 11:22 12/28/2017 17:06 12/28/2017 21:21  Glucose-Capillary Latest Ref Range: 65 - 99 mg/dL 293 (H) 344 (H) 306 (H) 321 (H)   Results for Stefanie Vincent, Stefanie Vincent (MRN [id number]) as of 12/29/2017 14:58  Ref. Range 12/29/2017 07:25 12/29/2017 12:21  Glucose-Capillary Latest Ref Range: 65 - 99 mg/dL 266 (H) 305 (H)      Home DM Meds: 70/30 Insulin- 10 units BID  Current Orders: 70/30 Insulin- 10 units BID                             Novolog Sensitive Correction Scale/ SSI (0-9 units) TID AC + HS         Note patient getting Solumedrol 40 mg Q8H.  CBGs consistently elevated > 250 mg/dl.   MD- Please consider the following in-hospital insulin adjustments while patient getting IV steroids:  1. Increase 70/30 Insulin to 15 units BID  2. Increase Novolog SSI to Moderate scale (0-15 units) TID AC + HS     --Will follow patient during hospitalization--  Wyn Quaker RN, MSN, CDE Diabetes Coordinator Inpatient Glycemic Control Team Team Pager: [phone] (8a-5p)

## 2017-12-29 NOTE — Progress Notes (Signed)
[facility]  PROGRESS NOTE  Stefanie Vincent GBT:[id number] DOB: [birth year] DOA: 12/26/2017 PCP: Helane Rima, MD   Brief HPI:    77 y.o. female with medical history significant of COPD, hypertension comes in with a week of coughing progressive worsening shortness of breath.  Her grandson who lives with her as been recently diagnosed with the flu.  She been having a lot of nasal congestion Patient is found to have influenza and pneumonia.   Subjective   Patient seen and examined, breathing is improved after diuresis with IV Lasix.   Assessment/Plan:     1. Community acquired pneumonia-continue Rocephin, Zithromax, follow blood culture results. 2. Pulmonary edema/fluid overload-patient has significant crackles bilaterally, good diuresis with IV Lasix yesterday.  Continue Lasix 20 mg IV every 12 hours.  Urine output around 3 L yesterday 3. Influenza A-continue Tamiflu 4. Acute hypoxic respiratory failure-secondary to community acquired pneumonia, influenza A patient requiring 9 L of oxygen via nasal cannula. Diabetes mellitus-continue sliding scale insulin with NovoLog/will cut down the Solu-Medrol to 40 mg IV Q8 hours due to elevated blood glucose.  Will change insulin 70/30-15 units subcu twice daily, moderate sliding scale insulin with NovoLog CBG (last 3)  Recent Labs    12/28/17 2121 12/29/17 0725 12/29/17 1221  GLUCAP 321* 266* 305*   5. Essential hypertension-antihypertensive medications on hold due to orthostatic hypotension, continue IV fluids. Will restart antihypertensive meds once patient blood pressure improves. 6. COPD exacerbation-continue albuterol/Atrovent nebulizer Q6 hours, will change Solu-Medrol to 40 mg IV Q8 hours.  7. Constipation-Dulcolax suppository PRN    DVT prophylaxis: Lovenox  Code Status: Full code  Family Communication: No family at bedside  Disposition Plan: likely home when medically ready for  discharge   Consultants:  none  Procedures:  none   Antibiotics:   Anti-infectives (From admission, onward)   Start     Dose/Rate Route Frequency Ordered Stop   12/29/17 1000  azithromycin (ZITHROMAX) tablet 500 mg     500 mg Oral Daily 12/28/17 1314 01/02/18 0959   12/27/17 1000  cefTRIAXone (ROCEPHIN) 1 g in sodium chloride 0.9 % 100 mL IVPB     1 g 200 mL/hr over 30 Minutes Intravenous Every 24 hours 12/26/17 1217 01/03/18 0959   12/27/17 1000  azithromycin (ZITHROMAX) 500 mg in sodium chloride 0.9 % 250 mL IVPB  Status:  Discontinued     500 mg 250 mL/hr over 60 Minutes Intravenous Every 24 hours 12/26/17 1217 12/28/17 1314   12/26/17 1230  oseltamivir (TAMIFLU) capsule 30 mg     30 mg Oral 2 times daily 12/26/17 1134 12/31/17 0959   12/26/17 1115  cefTRIAXone (ROCEPHIN) 1 g in sodium chloride 0.9 % 100 mL IVPB     1 g 200 mL/hr over 30 Minutes Intravenous  Once 12/26/17 1106 12/26/17 1205   12/26/17 1115  azithromycin (ZITHROMAX) 500 mg in sodium chloride 0.9 % 250 mL IVPB     500 mg 250 mL/hr over 60 Minutes Intravenous  Once 12/26/17 1106 12/26/17 1314       Objective   Vitals:   12/28/17 2139 12/29/17 0457 12/29/17 0849 12/29/17 1150  BP:  (!) 183/62    Pulse:  (!) 59    Resp:  18    Temp:  97.7 F (36.5 C)    TempSrc:  Oral    SpO2: 94% 90% 93% 93%  Weight:      Height:        Intake/Output Summary (Last 24 hours)  at 12/29/2017 1505 Last data filed at 12/29/2017 1224 Gross per 24 hour  Intake 1525.83 ml  Output 2950 ml  Net -1424.17 ml   Filed Weights   12/26/17 0908  Weight: 66.7 kg (147 lb)     Physical Examination:  Physical Exam: Eyes: No icterus, extraocular muscles intact  Mouth: Oral mucosa is moist, no lesions on palate,  Neck: Supple, no deformities, masses, or tenderness Lungs: Normal respiratory effort, bilateral crackles auscultated Heart: Regular rate and rhythm, S1 and S2 normal, no murmurs, rubs auscultated Abdomen: BS  normoactive,soft,nondistended,non-tender to palpation,no organomegaly Extremities: No pretibial edema, no erythema, no cyanosis, no clubbing Neuro : Alert and oriented to time, place and person, No focal deficits Skin: No rashes seen on exam     Data Reviewed: I have personally reviewed following labs and imaging studies  CBG: Recent Labs  Lab 12/28/17 1122 12/28/17 1706 12/28/17 2121 12/29/17 0725 12/29/17 1221  GLUCAP 344* 306* 321* 266* 305*    CBC: Recent Labs  Lab 12/26/17 0934 12/26/17 0943 12/27/17 0625 12/28/17 0621  WBC 6.2  --  5.8 3.6*  NEUTROABS 4.3  --  4.1  --   HGB 11.1* 11.9* 10.2* 11.8*  HCT 33.4* 35.0* 30.8* 35.6*  MCV 85.9  --  88.5 85.0  PLT 175  --  173 967    Basic Metabolic Panel: Recent Labs  Lab 12/26/17 0934 12/26/17 0943 12/27/17 0625 12/28/17 0621 12/29/17 0627  NA 133* 134* 136 135 135  K 3.6 3.9 3.6 3.7 3.4*  CL 96* 96* 103 98* 95*  CO2 24  --  24 25 26   GLUCOSE 118* 113* 96 313* 293*  BUN 29* 32* 20 17 22*  CREATININE 1.15* 1.10* 0.96 0.80 0.79  CALCIUM 8.8*  --  8.3* 8.9 9.0  MG 1.8  --   --   --   --   PHOS 3.8  --   --   --   --     Recent Results (from the past 240 hour(s))  Blood culture (routine x 2)     Status: None (Preliminary result)   Collection Time: 12/26/17  9:35 AM  Result Value Ref Range Status   Specimen Description   Final    BLOOD LEFT ANTECUBITAL Performed at [facility], [address], [place], [place] [address]    Special Requests   Final    BOTTLES DRAWN AEROBIC AND ANAEROBIC Blood Culture adequate volume Performed at [facility] [address], [place], [place] [address]    Culture   Final    NO GROWTH 2 DAYS Performed at [facility] [address] [place], [place] [address]    Report Status PENDING  Incomplete  Urine culture     Status: None   Collection Time: 12/26/17  9:35 AM  Result Value Ref Range Status   Specimen Description    Final    URINE, RANDOM Performed at [facility] [address], [place], [place] [address]    Special Requests   Final    NONE Performed at [facility], [place] [address] [place], [place] [address]    Culture   Final    NO GROWTH Performed at [facility], [place] [address], [place], [place] [address]    Report Status 12/29/2017 FINAL  Final  Blood culture (routine x 2)     Status: None (Preliminary result)   Collection Time: 12/26/17  9:36 AM  Result Value Ref Range Status   Specimen Description   Final    BLOOD RIGHT ANTECUBITAL Performed at [facility] [address], [place], [place] [address]    Special Requests   Final    BOTTLES DRAWN AEROBIC AND ANAEROBIC Blood Culture adequate volume Performed at [facility] [address] [place], [place] [address]    Culture   Final    NO GROWTH 2 DAYS Performed at [facility] [address], [place], [place] [address]    Report Status PENDING  Incomplete     Liver Function Tests: Recent Labs  Lab 12/26/17 0934  AST 40  ALT 20  ALKPHOS 65  BILITOT 0.4  PROT 6.7  ALBUMIN 3.5   Recent Labs  Lab 12/26/17 0934  LIPASE 20   No results for input(s): AMMONIA in the last 168 hours.  Cardiac Enzymes: No results for input(s): CKTOTAL, CKMB, CKMBINDEX, TROPONINI in the last 168 hours. BNP (last 3 results) Recent Labs    12/27/17 1655  BNP 261.0*    ProBNP (last 3 results) No results for input(s): PROBNP in the last 8760 hours.    Studies: Dg Chest Port 1 View  Result Date: 12/27/2017 CLINICAL DATA:  Dyspnea EXAM: PORTABLE CHEST 1 VIEW COMPARISON:  12/26/2017 chest radiograph. FINDINGS: Stable cardiomediastinal silhouette with normal heart size. No pneumothorax. No pleural effusion. No overt pulmonary edema. Mild hazy reticular opacities at the lung bases. No acute consolidative airspace disease. IMPRESSION: No acute  consolidative airspace disease. Mild hazy reticular bibasilar lung opacities, favor atelectasis or bronchiolitis. Electronically Signed   By: Ilona Sorrel M.D.   On: 12/27/2017 16:58    Scheduled Meds: . aspirin  81 mg Oral Daily  . atenolol  50 mg Oral Daily  . azithromycin  500 mg Oral Daily  . cholecalciferol  400 Units Oral Daily  . cholestyramine  4 g Oral 2 times per day  . enoxaparin (LOVENOX) injection  40 mg Subcutaneous Q24H  . furosemide  20 mg Intravenous Q12H  . insulin aspart  0-15 Units Subcutaneous TID WC  . insulin aspart protamine- aspart  15 Units Subcutaneous BID WC  . ipratropium-albuterol  3 mL Nebulization TID  . loratadine  10 mg Oral Daily  . methylPREDNISolone (SOLU-MEDROL) injection  40 mg Intravenous Q8H  . oseltamivir  30 mg Oral BID  . pravastatin  40 mg Oral Daily      Time spent: 25 minutes  [facility] Pager [phone]. If 7PM-7AM, please contact night-coverage at [email or web address], Office  [phone]  password [email or web address]  12/29/2017, 3:05 PM  LOS: 3 days

## 2017-12-29 NOTE — Care Management Important Message (Addendum)
Important Message  Patient Details IM letter given to Cookie/Case Manager to present to the Patient Name: Stefanie Vincent MRN: [id number] Date of Birth: 11/17/1940   Medicare Important Message Given:  Yes    [name] 12/29/2017, 11:01 AMImportant Message  Patient Details  Name: Stefanie Vincent MRN: [id number] Date of Birth: [birth year]   Medicare Important Message Given:  Yes    [name] 12/29/2017, 11:01 AM

## 2017-12-30 LAB — BASIC METABOLIC PANEL
Anion gap: 13 (ref 5–15)
BUN: 33 mg/dL — AB (ref 6–20)
CALCIUM: 9.2 mg/dL (ref 8.9–10.3)
CO2: 29 mmol/L (ref 22–32)
CREATININE: 0.99 mg/dL (ref 0.44–1.00)
Chloride: 91 mmol/L — ABNORMAL LOW (ref 101–111)
GFR calc non Af Amer: 53 mL/min — ABNORMAL LOW (ref >60)
GLUCOSE: 282 mg/dL — AB (ref 65–99)
Potassium: 3.7 mmol/L (ref 3.5–5.1)
Sodium: 133 mmol/L — ABNORMAL LOW (ref 135–145)

## 2017-12-30 LAB — GLUCOSE, CAPILLARY
GLUCOSE-CAPILLARY: 287 mg/dL — AB (ref 65–99)
Glucose-Capillary: 281 mg/dL — ABNORMAL HIGH (ref 65–99)
Glucose-Capillary: 278 mg/dL — ABNORMAL HIGH (ref 65–99)
Glucose-Capillary: 284 mg/dL — ABNORMAL HIGH (ref 65–99)

## 2017-12-30 MED ORDER — METHYLPREDNISOLONE SODIUM SUCC 40 MG IJ SOLR
40.0000 mg | Freq: Two times a day (BID) | INTRAMUSCULAR | Status: DC
  Administered 2017-12-31: 40 mg via INTRAVENOUS
  Filled 2017-12-30 (×2): qty 1

## 2017-12-30 NOTE — Progress Notes (Signed)
[facility]  PROGRESS NOTE  Stefanie Vincent VFI:[id number] DOB: 08/05/1941 DOA: 12/26/2017 PCP: Helane Rima, MD   Brief HPI:    77 y.o. female with medical history significant of COPD, hypertension comes in with a week of coughing progressive worsening shortness of breath.  Her grandson who lives with her as been recently diagnosed with the flu.  She been having a lot of nasal congestion Patient is found to have influenza and pneumonia.   Subjective   Patient seen and examined, breathing is improved with IV Lasix.   Assessment/Plan:     1. Community acquired pneumonia-continue Rocephin, Zithromax, follow blood culture results. 2. Pulmonary edema/fluid overload-patient has significant crackles bilaterally, good diuresis with IV Lasix yesterday.  Continue Lasix 20 mg IV every 12 hours.  Urine output around 2.5 L yesterday.  Will obtain echocardiogram. 3. Influenza A-continue Tamiflu 4. Acute hypoxic respiratory failure-secondary to community acquired pneumonia, influenza A ,  Diabetes mellitus-continue sliding scale insulin with NovoLog/will cut down the Solu-Medrol to 40 mg IV Q12hours due to elevated blood glucose.  Continue insulin 70/30-15 units subcu twice daily, moderate sliding scale insulin with NovoLog CBG (last 3)  Recent Labs    12/29/17 2029 12/30/17 0741 12/30/17 1119  GLUCAP 276* 287* 281*   5. Essential hypertension-antihypertensive medications were on hold due to orthostatic hypotension, blood pressure has improved, continue atenolol, hydralazine as needed 6. COPD exacerbation-continue albuterol/Atrovent nebulizer Q6 hours, will change Solu-Medrol to 40 mg IV Q8 hours.  7. Constipation-Dulcolax suppository PRN    DVT prophylaxis: Lovenox  Code Status: Full code  Family Communication: No family at bedside  Disposition Plan: likely home when medically ready for discharge   Consultants:  none  Procedures:  none   Antibiotics:    Anti-infectives (From admission, onward)   Start     Dose/Rate Route Frequency Ordered Stop   12/29/17 1000  azithromycin (ZITHROMAX) tablet 500 mg     500 mg Oral Daily 12/28/17 1314 01/02/18 0959   12/27/17 1000  cefTRIAXone (ROCEPHIN) 1 g in sodium chloride 0.9 % 100 mL IVPB     1 g 200 mL/hr over 30 Minutes Intravenous Every 24 hours 12/26/17 1217 01/03/18 0959   12/27/17 1000  azithromycin (ZITHROMAX) 500 mg in sodium chloride 0.9 % 250 mL IVPB  Status:  Discontinued     500 mg 250 mL/hr over 60 Minutes Intravenous Every 24 hours 12/26/17 1217 12/28/17 1314   12/26/17 1230  oseltamivir (TAMIFLU) capsule 30 mg     30 mg Oral 2 times daily 12/26/17 1134 12/31/17 0959   12/26/17 1115  cefTRIAXone (ROCEPHIN) 1 g in sodium chloride 0.9 % 100 mL IVPB     1 g 200 mL/hr over 30 Minutes Intravenous  Once 12/26/17 1106 12/26/17 1205   12/26/17 1115  azithromycin (ZITHROMAX) 500 mg in sodium chloride 0.9 % 250 mL IVPB     500 mg 250 mL/hr over 60 Minutes Intravenous  Once 12/26/17 1106 12/26/17 1314       Objective   Vitals:   12/30/17 0459 12/30/17 0501 12/30/17 0912 12/30/17 1338  BP: (!) 178/67 (!) 178/67  (!) 163/60  Pulse: (!) 57   (!) 54  Resp: 20   20  Temp: 97.8 F (36.6 C)   97.6 F (36.4 C)  TempSrc: Oral   Oral  SpO2: 90%  90% 94%  Weight:      Height:        Intake/Output Summary (Last 24 hours) at 12/30/2017 1448  Last data filed at 12/30/2017 1339 Gross per 24 hour  Intake 650.83 ml  Output 2525 ml  Net -1874.17 ml   Filed Weights   12/26/17 0908  Weight: 66.7 kg (147 lb)     Physical Examination:  Physical Exam: Eyes: No icterus, extraocular muscles intact  Mouth: Oral mucosa is moist, no lesions on palate,  Neck: Supple, no deformities, masses, or tenderness Lungs: Normal respiratory effort, bilateral crackles auscultated Heart: Regular rate and rhythm, S1 and S2 normal, no murmurs, rubs auscultated Abdomen: BS  normoactive,soft,nondistended,non-tender to palpation,no organomegaly Extremities: No pretibial edema, no erythema, no cyanosis, no clubbing Neuro : Alert and oriented to time, place and person, No focal deficits Skin: No rashes seen on exam     Data Reviewed: I have personally reviewed following labs and imaging studies  CBG: Recent Labs  Lab 12/29/17 1221 12/29/17 1640 12/29/17 2029 12/30/17 0741 12/30/17 1119  GLUCAP 305* 298* 276* 287* 281*    CBC: Recent Labs  Lab 12/26/17 0934 12/26/17 0943 12/27/17 0625 12/28/17 0621  WBC 6.2  --  5.8 3.6*  NEUTROABS 4.3  --  4.1  --   HGB 11.1* 11.9* 10.2* 11.8*  HCT 33.4* 35.0* 30.8* 35.6*  MCV 85.9  --  88.5 85.0  PLT 175  --  173 938    Basic Metabolic Panel: Recent Labs  Lab 12/26/17 0934 12/26/17 0943 12/27/17 0625 12/28/17 0621 12/29/17 0627  NA 133* 134* 136 135 135  K 3.6 3.9 3.6 3.7 3.4*  CL 96* 96* 103 98* 95*  CO2 24  --  24 25 26   GLUCOSE 118* 113* 96 313* 293*  BUN 29* 32* 20 17 22*  CREATININE 1.15* 1.10* 0.96 0.80 0.79  CALCIUM 8.8*  --  8.3* 8.9 9.0  MG 1.8  --   --   --   --   PHOS 3.8  --   --   --   --     Recent Results (from the past 240 hour(s))  Blood culture (routine x 2)     Status: None (Preliminary result)   Collection Time: 12/26/17  9:35 AM  Result Value Ref Range Status   Specimen Description   Final    BLOOD LEFT ANTECUBITAL Performed at [facility], [address], [place], [place] [address]    Special Requests   Final    BOTTLES DRAWN AEROBIC AND ANAEROBIC Blood Culture adequate volume Performed at [facility] [address], [place], [place] [address]    Culture   Final    NO GROWTH 4 DAYS Performed at [facility], [place] [address], [place], [place] [address]    Report Status PENDING  Incomplete  Urine culture     Status: None   Collection Time: 12/26/17  9:35 AM  Result Value Ref Range Status   Specimen Description    Final    URINE, RANDOM Performed at [facility] [address] [place], [place] [address]    Special Requests   Final    NONE Performed at [facility], [place] [address], [place], [place] [address]    Culture   Final    NO GROWTH Performed at [facility], [place] [address], [place], [place] [address]    Report Status 12/29/2017 FINAL  Final  Blood culture (routine x 2)     Status: None (Preliminary result)   Collection Time: 12/26/17  9:36 AM  Result Value  Ref Range Status   Specimen Description   Final    BLOOD RIGHT ANTECUBITAL Performed at [facility] [address] [place], [place] [address]    Special Requests   Final    BOTTLES DRAWN AEROBIC AND ANAEROBIC Blood Culture adequate volume Performed at [facility] [address] [place], [place] [address]    Culture   Final    NO GROWTH 4 DAYS Performed at [facility], [place] [address], [place], [place] [address]    Report Status PENDING  Incomplete     Liver Function Tests: Recent Labs  Lab 12/26/17 0934  AST 40  ALT 20  ALKPHOS 65  BILITOT 0.4  PROT 6.7  ALBUMIN 3.5   Recent Labs  Lab 12/26/17 0934  LIPASE 20   No results for input(s): AMMONIA in the last 168 hours.  Cardiac Enzymes: No results for input(s): CKTOTAL, CKMB, CKMBINDEX, TROPONINI in the last 168 hours. BNP (last 3 results) Recent Labs    12/27/17 1655  BNP 261.0*    ProBNP (last 3 results) No results for input(s): PROBNP in the last 8760 hours.    Studies: No results found.  Scheduled Meds: . aspirin  81 mg Oral Daily  . atenolol  50 mg Oral Daily  . azithromycin  500 mg Oral Daily  . cholecalciferol  400 Units Oral Daily  . cholestyramine  4 g Oral 2 times per day  . enoxaparin (LOVENOX) injection  40 mg Subcutaneous Q24H  . furosemide  20 mg Intravenous Q12H  . insulin aspart  0-15 Units Subcutaneous TID WC  . insulin aspart  protamine- aspart  15 Units Subcutaneous BID WC  . ipratropium-albuterol  3 mL Nebulization TID  . loratadine  10 mg Oral Daily  . methylPREDNISolone (SOLU-MEDROL) injection  40 mg Intravenous Q8H  . oseltamivir  30 mg Oral BID  . pravastatin  40 mg Oral Daily      Time spent: 25 minutes  [facility] Pager [phone]. If 7PM-7AM, please contact night-coverage at [email or web address], Office  [phone]  password [email or web address]  12/30/2017, 2:48 PM  LOS: 4 days

## 2017-12-31 ENCOUNTER — Inpatient Hospital Stay (HOSPITAL_COMMUNITY): Payer: Medicare Other

## 2017-12-31 DIAGNOSIS — J101 Influenza due to other identified influenza virus with other respiratory manifestations: Secondary | ICD-10-CM

## 2017-12-31 DIAGNOSIS — R0902 Hypoxemia: Secondary | ICD-10-CM

## 2017-12-31 DIAGNOSIS — J189 Pneumonia, unspecified organism: Secondary | ICD-10-CM

## 2017-12-31 DIAGNOSIS — I10 Essential (primary) hypertension: Secondary | ICD-10-CM

## 2017-12-31 DIAGNOSIS — R0600 Dyspnea, unspecified: Secondary | ICD-10-CM

## 2017-12-31 LAB — BASIC METABOLIC PANEL
ANION GAP: 11 (ref 5–15)
BUN: 37 mg/dL — ABNORMAL HIGH (ref 6–20)
CALCIUM: 9.1 mg/dL (ref 8.9–10.3)
CO2: 29 mmol/L (ref 22–32)
Chloride: 93 mmol/L — ABNORMAL LOW (ref 101–111)
Creatinine, Ser: 0.92 mg/dL (ref 0.44–1.00)
GFR, EST NON AFRICAN AMERICAN: 58 mL/min — AB (ref >60)
Glucose, Bld: 277 mg/dL — ABNORMAL HIGH (ref 65–99)
Potassium: 3.7 mmol/L (ref 3.5–5.1)
Sodium: 133 mmol/L — ABNORMAL LOW (ref 135–145)

## 2017-12-31 LAB — ECHOCARDIOGRAM COMPLETE
HEIGHTINCHES: 62 in
Weight: 2352 oz

## 2017-12-31 LAB — GLUCOSE, CAPILLARY
GLUCOSE-CAPILLARY: 264 mg/dL — AB (ref 65–99)
Glucose-Capillary: 308 mg/dL — ABNORMAL HIGH (ref 65–99)
Glucose-Capillary: 230 mg/dL — ABNORMAL HIGH (ref 65–99)
Glucose-Capillary: 157 mg/dL — ABNORMAL HIGH (ref 65–99)

## 2017-12-31 LAB — CULTURE, BLOOD (ROUTINE X 2)
CULTURE: NO GROWTH
CULTURE: NO GROWTH
Special Requests: ADEQUATE
Special Requests: ADEQUATE

## 2017-12-31 MED ORDER — ALUM & MAG HYDROXIDE-SIMETH 200-200-20 MG/5ML PO SUSP
15.0000 mL | ORAL | Status: DC | PRN
  Administered 2017-12-31 – 2018-01-02 (×2): 15 mL via ORAL
  Filled 2017-12-31 (×2): qty 30

## 2017-12-31 MED ORDER — INSULIN ASPART PROT & ASPART (70-30 MIX) 100 UNIT/ML ~~LOC~~ SUSP
10.0000 [IU] | Freq: Two times a day (BID) | SUBCUTANEOUS | Status: DC
  Administered 2017-12-31 – 2018-01-02 (×4): 10 [IU] via SUBCUTANEOUS

## 2017-12-31 NOTE — Progress Notes (Signed)
Foley catheter removed per MD order.  Patient tolerated well.  

## 2017-12-31 NOTE — Progress Notes (Signed)
  Echocardiogram 2D Echocardiogram has been performed.  Darlina Sicilian M 12/31/2017, 11:59 AM

## 2017-12-31 NOTE — Evaluation (Signed)
Physical Therapy Evaluation Patient Details Name: Stefanie Vincent MRN: [id number] DOB: August 13, 1941 Today's Date: 12/31/2017   History of Present Illness  77 yo female admitted with Pna, +flu. hx of HTN, DM, COPD.   Clinical Impression  On eval, pt required Mod assist for mobility. She was able to take a few steps and pivot to recliner with great effort and significant assistance. O2 sat at rest on RA: 87-88%. Replaced Robinson 4L O2 for activity-93%. Pt presents with general weakness, decreased activity tolerance, and impaired gait and balance. She is significantly weaker than baseline 2* prolonged bedrest. She is currently at high risk for falls. Pt reported she wears a special shoe for R foot however it is not in good condition. Requested daughter bring in shoe so we can see IF we can possibly get her another while she is here in hospital. It may possibly improve her balance a little as well. Will continue to follow and progress activity. Recommendation is for ST rehab at SNF at this time, if pt is agreeable.     Follow Up Recommendations SNF    Equipment Recommendations  (TBD)    Recommendations for Other Services OT consult     Precautions / Restrictions Precautions Precautions: Fall Precaution Comments: droplet; monitor O2 sat Restrictions Weight Bearing Restrictions: No      Mobility  Bed Mobility Overal bed mobility: Needs Assistance Bed Mobility: Supine to Sit     Supine to sit: Min guard;HOB elevated     General bed mobility comments: Increased time and effort. No assist given.   Transfers Overall transfer level: Needs assistance Equipment used: Rolling walker (2 wheeled) Transfers: Sit to/from [facility] Sit to Stand: Mod assist         General transfer comment: Assist to rise, stabilize, control descent. VCs safety, technique, hand placement. Posterior bias. Stand pivot to recliner with RW. High fall risk.  Ambulation/Gait Ambulation/Gait  assistance: Mod assist Ambulation Distance (Feet): 3 Feet Assistive device: Rolling walker (2 wheeled) Gait Pattern/deviations: Step-to pattern;Narrow base of support     General Gait Details: Very unsteady. Assist needed to support pt and maneuver with RW. High fall risk. O2 sat 93% on 4L .   Stairs            Wheelchair Mobility    Modified Rankin (Stroke Patients Only)       Balance Overall balance assessment: Needs assistance         Standing balance support: Bilateral upper extremity supported Standing balance-Leahy Scale: Poor                               Pertinent Vitals/Pain Pain Assessment: Faces Faces Pain Scale: Hurts even more Pain Location: chest, L LE with WBing Pain Descriptors / Indicators: Aching;Discomfort;Sore Pain Intervention(s): Limited activity within patient's tolerance;Monitored during session;Repositioned    Home Living Family/patient expects to be discharged to:: Unsure Living Arrangements: Children(daughter who works) Available Help at Discharge: Family;Available PRN/intermittently Type of Home: House Home Access: Stairs to enter Entrance Stairs-Rails: None Entrance Stairs-Number of Steps: 1 Home Layout: One level Home Equipment: Walker - 2 wheels      Prior Function Level of Independence: Independent               Hand Dominance        Extremity/Trunk Assessment   Upper Extremity Assessment Upper Extremity Assessment: Generalized weakness    Lower Extremity Assessment Lower Extremity Assessment:  Generalized weakness(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)    Cervical / Trunk Assessment Cervical / Trunk Assessment: Normal  Communication   Communication: No difficulties  Cognition Arousal/Alertness: Awake/alert Behavior During Therapy: WFL for tasks assessed/performed Overall Cognitive Status: Within Functional Limits for tasks assessed                                        General  Comments      Exercises     Assessment/Plan    PT Assessment Patient needs continued PT services  PT Problem List Decreased strength;Decreased balance;Decreased mobility;Decreased activity tolerance;Pain;Decreased knowledge of use of DME;Cardiopulmonary status limiting activity       PT Treatment Interventions DME instruction;Gait training;Functional mobility training;Therapeutic activities;Balance training;Patient/family education;Therapeutic exercise    PT Goals (Current goals can be found in the Care Plan section)  Acute Rehab PT Goals Patient Stated Goal: to regain PLOF/independence PT Goal Formulation: With patient Time For Goal Achievement: 01/14/18 Potential to Achieve Goals: Good    Frequency Min 3X/week   Barriers to discharge        Co-evaluation               AM-PAC PT "6 Clicks" Daily Activity  Outcome Measure Difficulty turning over in bed (including adjusting bedclothes, sheets and blankets)?: A Lot Difficulty moving from lying on back to sitting on the side of the bed? : A Lot Difficulty sitting down on and standing up from a chair with arms (e.g., wheelchair, bedside commode, etc,.)?: Unable Help needed moving to and from a bed to chair (including a wheelchair)?: A Lot Help needed walking in hospital room?: A Lot Help needed climbing 3-5 steps with a railing? : Total 6 Click Score: 10    End of Session Equipment Utilized During Treatment: Gait belt;Oxygen Activity Tolerance: Patient limited by fatigue Patient left: in chair;with call bell/phone within reach   PT Visit Diagnosis: Muscle weakness (generalized) (M62.81);Difficulty in walking, not elsewhere classified (R26.2);Unsteadiness on feet (R26.81);Other abnormalities of gait and mobility (R26.89)    Time: 1914-7829 PT Time Calculation (min) (ACUTE ONLY): 31 min   Charges:   PT Evaluation $PT Eval Moderate Complexity: 1 Mod PT Treatments $Therapeutic Activity: 8-22 mins   PT G Codes:           Weston Anna, MPT Pager: [phone]

## 2017-12-31 NOTE — Progress Notes (Signed)
PROGRESS NOTE    Stefanie Vincent  ERX:[id number] DOB: 12/23/1940 DOA: 12/26/2017 PCP: Helane Rima, MD    Brief Narrative:  77 year old female who presented with dyspnea.  She does have a significant past medical history of COPD, and hypertension.  Presents with 7-day course of progressive and worsening dyspnea, associated with cough, and generalized weakness.  Positive sick contacts at home, with influenza.  On the initial physical examination her oximetry was down to 80% on room air and her home, in the emergency department her blood pressure 133/50, heart rate 51, respiratory rate 18, temperature 98.4, oximetry 95% on supplemental oxygen.  Moist mucous membranes, coarse breath sounds bilaterally, positive wheezing, no rhonchi, heart S1-S2 present rhythmic, no gallops, rubs or murmurs, abdomen soft nontender, no lower extremity edema.  Patient was admitted to the hospital can diagnosis of acute hypoxic respiratory failure due to influenza A complicated by community-acquired pneumonia.  Assessment & Plan:   Principal Problem:   Influenza A Active Problems:   Type 2 diabetes mellitus ([place])   Essential hypertension   CAP (community acquired pneumonia)   Acute respiratory failure with hypoxia (HCC)   Orthostatic hypotension   1.  Acute hypoxic respiratory failure due to influenza A and community-acquired pneumonia. Will continue antibiotic therapy with ceftriaxone and azithromycin, will continue to follow on cultures, cell count and temperature curve. Continue oxymetry monitoring and wean off supplemental 02 as tolerated. Out of bed as tolerated, physical therapy evaluation.   2.  COPD exacerbation. Continue bronchodilator therapy, oxymetry monitoring, will stop steroids, will follow on physical therapy evaluation.   3.  Type 2 diabetes mellitus. Continue insulin regimen, taper off steroids, patient tolerating po well, continue insulin sliding scale.   4.  Hypertension. Continue blood  pressure monitoring. Continue atenolol.   DVT prophylaxis: enoxaparin   Code Status:  full Family Communication: no family at the bedside Disposition Plan: home   Consultants:     Procedures:     Antimicrobials:       Subjective: Patient is feeling very weak and deconditioned, no nausea or vomiting, no fever or chills.   Objective: Vitals:   12/30/17 2114 12/30/17 2231 12/31/17 0655 12/31/17 0854  BP: (!) 210/58  (!) 187/62   Pulse: (!) 52  (!) 53   Resp: 20  18   Temp: 97.7 F (36.5 C)  (!) 97.1 F (36.2 C)   TempSrc: Oral  Oral   SpO2: 95% 94% 91% 94%  Weight:      Height:        Intake/Output Summary (Last 24 hours) at 12/31/2017 1044 Last data filed at 12/31/2017 0955 Gross per 24 hour  Intake 840 ml  Output 2700 ml  Net -1860 ml   Filed Weights   12/26/17 0908  Weight: 66.7 kg (147 lb)    Examination:   General: Not in pain or dyspnea. deconditioned Neurology: Awake and alert, non focal  E ENT: no pallor, no icterus, oral mucosa moist Cardiovascular: No JVD. S1-S2 present, rhythmic, no gallops, rubs, or murmurs. No lower extremity edema. Pulmonary: decreased breath sounds bilaterally, poor air movement, mild end expiratory wheezing, no rhonchi or rales. Gastrointestinal. Abdomen with no organomegaly, non tender, no rebound or guarding Skin. No rashes Musculoskeletal: no joint deformities     Data Reviewed: I have personally reviewed following labs and imaging studies  CBC: Recent Labs  Lab 12/26/17 0934 12/26/17 0943 12/27/17 0625 12/28/17 0621  WBC 6.2  --  5.8 3.6*  NEUTROABS 4.3  --  4.1  --   HGB 11.1* 11.9* 10.2* 11.8*  HCT 33.4* 35.0* 30.8* 35.6*  MCV 85.9  --  88.5 85.0  PLT 175  --  173 637   Basic Metabolic Panel: Recent Labs  Lab 12/26/17 0934  12/27/17 0625 12/28/17 0621 12/29/17 0627 12/30/17 1504 12/31/17 0555  NA 133*   < > 136 135 135 133* 133*  K 3.6   < > 3.6 3.7 3.4* 3.7 3.7  CL 96*   < > 103 98* 95* 91*  93*  CO2 24  --  24 25 26 29 29   GLUCOSE 118*   < > 96 313* 293* 282* 277*  BUN 29*   < > 20 17 22* 33* 37*  CREATININE 1.15*   < > 0.96 0.80 0.79 0.99 0.92  CALCIUM 8.8*  --  8.3* 8.9 9.0 9.2 9.1  MG 1.8  --   --   --   --   --   --   PHOS 3.8  --   --   --   --   --   --    < > = values in this interval not displayed.   GFR: Estimated Creatinine Clearance: 45.1 mL/min (by C-G formula based on SCr of 0.92 mg/dL). Liver Function Tests: Recent Labs  Lab 12/26/17 0934  AST 40  ALT 20  ALKPHOS 65  BILITOT 0.4  PROT 6.7  ALBUMIN 3.5   Recent Labs  Lab 12/26/17 0934  LIPASE 20   No results for input(s): AMMONIA in the last 168 hours. Coagulation Profile: No results for input(s): INR, PROTIME in the last 168 hours. Cardiac Enzymes: No results for input(s): CKTOTAL, CKMB, CKMBINDEX, TROPONINI in the last 168 hours. BNP (last 3 results) No results for input(s): PROBNP in the last 8760 hours. HbA1C: No results for input(s): HGBA1C in the last 72 hours. CBG: Recent Labs  Lab 12/30/17 0741 12/30/17 1119 12/30/17 1657 12/30/17 2117 12/31/17 0730  GLUCAP 287* 281* 278* 284* 308*   Lipid Profile: No results for input(s): CHOL, HDL, LDLCALC, TRIG, CHOLHDL, LDLDIRECT in the last 72 hours. Thyroid Function Tests: No results for input(s): TSH, T4TOTAL, FREET4, T3FREE, THYROIDAB in the last 72 hours. Anemia Panel: No results for input(s): VITAMINB12, FOLATE, FERRITIN, TIBC, IRON, RETICCTPCT in the last 72 hours.    Radiology Studies: I have reviewed all of the imaging during this hospital visit personally     Scheduled Meds: . aspirin  81 mg Oral Daily  . atenolol  50 mg Oral Daily  . azithromycin  500 mg Oral Daily  . cholecalciferol  400 Units Oral Daily  . cholestyramine  4 g Oral 2 times per day  . enoxaparin (LOVENOX) injection  40 mg Subcutaneous Q24H  . furosemide  20 mg Intravenous Q12H  . insulin aspart  0-15 Units Subcutaneous TID WC  . insulin aspart  protamine- aspart  15 Units Subcutaneous BID WC  . ipratropium-albuterol  3 mL Nebulization TID  . loratadine  10 mg Oral Daily  . methylPREDNISolone (SOLU-MEDROL) injection  40 mg Intravenous Q12H  . pravastatin  40 mg Oral Daily   Continuous Infusions: . sodium chloride 10 mL/hr at 12/27/17 0859  . cefTRIAXone (ROCEPHIN)  IV 1 g (12/31/17 1006)     LOS: 5 days        Mauricio Gerome Apley, MD [facility] Pager [phone]

## 2018-01-01 LAB — GLUCOSE, CAPILLARY
Glucose-Capillary: 93 mg/dL (ref 65–99)
Glucose-Capillary: 242 mg/dL — ABNORMAL HIGH (ref 65–99)
Glucose-Capillary: 101 mg/dL — ABNORMAL HIGH (ref 65–99)
Glucose-Capillary: 110 mg/dL — ABNORMAL HIGH (ref 65–99)

## 2018-01-01 MED ORDER — FLUTICASONE FUROATE-VILANTEROL 200-25 MCG/INH IN AEPB
1.0000 | INHALATION_SPRAY | Freq: Every day | RESPIRATORY_TRACT | Status: DC
  Administered 2018-01-01 – 2018-01-02 (×2): 1 via RESPIRATORY_TRACT
  Filled 2018-01-01: qty 28

## 2018-01-01 NOTE — Progress Notes (Signed)
Called by RN to assess pt. for <'d saturations while on n/c, noted pt. was a "mouth breather", has order for sats >92%, pt. given schedule med. aerosol @ 2112 prior on 12/31/2017 and was on 4 lpm n/c, attempting to wean but unable due to continuous pulse ox alarming probably due to mouth breathing episodes, RT placed pt. on Venti-mask @ 6 lpm/35%=~3.5 lpm for this evening., RN made aware.

## 2018-01-01 NOTE — Progress Notes (Addendum)
PROGRESS NOTE    Stefanie Vincent  FVC:[id number] DOB: 11-Dec-1940 DOA: 12/26/2017 PCP: Helane Rima, MD    Brief Narrative:  77 year old female who presented with dyspnea.  She does have a significant past medical history of COPD, and hypertension.  Presents with 7-day course of progressive and worsening dyspnea, associated with cough, and generalized weakness.  Positive sick contacts at home, with influenza.  On the initial physical examination her oximetry was down to 80% on room air and her home, in the emergency department her blood pressure 133/50, heart rate 51, respiratory rate 18, temperature 98.4, oximetry 95% on supplemental oxygen.  Moist mucous membranes, coarse breath sounds bilaterally, positive wheezing, no rhonchi, heart S1-S2 present rhythmic, no gallops, rubs or murmurs, abdomen soft nontender, no lower extremity edema.  Sodium 133, potassium 3.6, chloride 96, bicarb 24, glucose 118, BUN 29, creatinine 1.15, white count 6.2, hemoglobin 11.1, hematocrit 33.4, platelets 175.  Influenza A+.  Urinalysis negative for infection, 100 protein, specific gravity 1.025.  Urine Streptococcus pneumonia antigen negative.  Chest x-ray negative for infiltrates, positive for hyperinflation, CT chest with bilateral faint groundglass opacities.  EKG sinus rhythm, poor R wave progression, low voltage.  Patient was admitted to the hospital with the working diagnosis of acute hypoxic respiratory failure due to influenza A complicated by community-acquired pneumonia.  Assessment & Plan:   Principal Problem:   Influenza A Active Problems:   Type 2 diabetes mellitus ([place])   Essential hypertension   CAP (community acquired pneumonia)   Acute respiratory failure with hypoxia (HCC)   Orthostatic hypotension   1.  Acute hypoxic respiratory failure due to influenza A and community-acquired pneumonia. Antibiotic therapy with ceftriaxone and azithromycin #6.Out of bed as tolerated, will need SNF at  discharge. Last night patient developed desaturation, required venti mask. Will continue supplemental 02 per [place] this am. Keep 02 saturation above 88%. Acute pulmonary edema due to core pulmonale, responded well to diuresis.   2.  COPD exacerbation. On bronchodilator therapy, continue oxymetry monitoring. Add long acting b agonist and inhaled corticosteroids.   3.  Type 2 diabetes mellitus. Insulin regimen with sliding scale for glucose cover and montiorign, insulin 70/30 10 units bidcapillary glucose 264, 230, 157, 93, 242. Patient off systemic steroids.   4.  Hypertension. On atenolol for blood pressure control. .   DVT prophylaxis: enoxaparin   Code Status:  full Family Communication: no family at the bedside Disposition Plan: home   Consultants:     Procedures:     Antimicrobials:       Subjective: Patient feeling very weak and deconditioned. This am dyspnea has improved, no chest pain no nausea or vomiting.   Objective: Vitals:   01/01/18 0655 01/01/18 0904 01/01/18 1436 01/01/18 1453  BP: (!) 153/60  (!) 157/53   Pulse: (!) 52  94   Resp: 16     Temp: 97.7 F (36.5 C)  98.5 F (36.9 C)   TempSrc: Oral  Oral   SpO2: 92% 95% 95% 96%  Weight:      Height:        Intake/Output Summary (Last 24 hours) at 01/01/2018 1546 Last data filed at 01/01/2018 1351 Gross per 24 hour  Intake 780 ml  Output 200 ml  Net 580 ml   Filed Weights   12/26/17 0908  Weight: 66.7 kg (147 lb)    Examination:   General: Not in pain or dyspnea, deconditioned Neurology: Awake and alert, non focal  E ENT: mild pallor,  no icterus, oral mucosa moist Cardiovascular: No JVD. S1-S2 present, rhythmic, no gallops, rubs, or murmurs. No lower extremity edema. Pulmonary: decreased breath sounds bilaterally, poor air movement, no wheezing, bilateral diffuse rhonchi. No rales. Gastrointestinal. Abdomen no organomegaly, non tender, no rebound or guarding Skin. No  rashes Musculoskeletal: no joint deformities     Data Reviewed: I have personally reviewed following labs and imaging studies  CBC: Recent Labs  Lab 12/26/17 0934 12/26/17 0943 12/27/17 0625 12/28/17 0621  WBC 6.2  --  5.8 3.6*  NEUTROABS 4.3  --  4.1  --   HGB 11.1* 11.9* 10.2* 11.8*  HCT 33.4* 35.0* 30.8* 35.6*  MCV 85.9  --  88.5 85.0  PLT 175  --  173 623   Basic Metabolic Panel: Recent Labs  Lab 12/26/17 0934  12/27/17 0625 12/28/17 0621 12/29/17 0627 12/30/17 1504 12/31/17 0555  NA 133*   < > 136 135 135 133* 133*  K 3.6   < > 3.6 3.7 3.4* 3.7 3.7  CL 96*   < > 103 98* 95* 91* 93*  CO2 24  --  24 25 26 29 29   GLUCOSE 118*   < > 96 313* 293* 282* 277*  BUN 29*   < > 20 17 22* 33* 37*  CREATININE 1.15*   < > 0.96 0.80 0.79 0.99 0.92  CALCIUM 8.8*  --  8.3* 8.9 9.0 9.2 9.1  MG 1.8  --   --   --   --   --   --   PHOS 3.8  --   --   --   --   --   --    < > = values in this interval not displayed.   GFR: Estimated Creatinine Clearance: 45.1 mL/min (by C-G formula based on SCr of 0.92 mg/dL). Liver Function Tests: Recent Labs  Lab 12/26/17 0934  AST 40  ALT 20  ALKPHOS 65  BILITOT 0.4  PROT 6.7  ALBUMIN 3.5   Recent Labs  Lab 12/26/17 0934  LIPASE 20   No results for input(s): AMMONIA in the last 168 hours. Coagulation Profile: No results for input(s): INR, PROTIME in the last 168 hours. Cardiac Enzymes: No results for input(s): CKTOTAL, CKMB, CKMBINDEX, TROPONINI in the last 168 hours. BNP (last 3 results) No results for input(s): PROBNP in the last 8760 hours. HbA1C: No results for input(s): HGBA1C in the last 72 hours. CBG: Recent Labs  Lab 12/31/17 1156 12/31/17 1645 12/31/17 2052 01/01/18 0733 01/01/18 1114  GLUCAP 264* 230* 157* 93 242*   Lipid Profile: No results for input(s): CHOL, HDL, LDLCALC, TRIG, CHOLHDL, LDLDIRECT in the last 72 hours. Thyroid Function Tests: No results for input(s): TSH, T4TOTAL, FREET4, T3FREE,  THYROIDAB in the last 72 hours. Anemia Panel: No results for input(s): VITAMINB12, FOLATE, FERRITIN, TIBC, IRON, RETICCTPCT in the last 72 hours.    Radiology Studies: I have reviewed all of the imaging during this hospital visit personally     Scheduled Meds: . aspirin  81 mg Oral Daily  . atenolol  50 mg Oral Daily  . cholecalciferol  400 Units Oral Daily  . cholestyramine  4 g Oral 2 times per day  . enoxaparin (LOVENOX) injection  40 mg Subcutaneous Q24H  . insulin aspart  0-15 Units Subcutaneous TID WC  . insulin aspart protamine- aspart  10 Units Subcutaneous BID WC  . ipratropium-albuterol  3 mL Nebulization TID  . loratadine  10 mg Oral Daily  .  pravastatin  40 mg Oral Daily   Continuous Infusions:   LOS: 6 days        Elizaveta Mattice Gerome Apley, MD [facility] Pager [phone]

## 2018-01-01 NOTE — Progress Notes (Signed)
Patient could not tolerate venti mask any longer. Placed patient back on 4L O2. O2 is currently 94%. Continue to monitor.

## 2018-01-01 NOTE — Progress Notes (Addendum)
Patient's continuous pulse ox kept alarming with O2 dropping below 90%. First tried increasing O2 to 5L but alarm kept going off. Patient tends to mouth breath. Called RT to assess. See RT note. Paged Baltazar Najjar, NP. Continue to monitor.

## 2018-01-01 NOTE — Evaluation (Addendum)
Occupational Therapy Evaluation Patient Details Name: Stefanie Vincent MRN: [id number] DOB: [birth year] Today's Date: 01/01/2018    History of Present Illness 77 yo female admitted with Pna, +flu. hx of HTN, DM, COPD.    Clinical Impression   Pt was admitted for the above.  Pt reports she is very independent at baseline. Pt needs mod +2 assistance to stand and fatiques easily. Will follow in acute setting with min A level goals    Follow Up Recommendations  SNF    Equipment Recommendations  3 in 1 bedside commode    Recommendations for Other Services       Precautions / Restrictions Precautions Precautions: Fall Precaution Comments: droplet; monitor O2 sat Restrictions Weight Bearing Restrictions: No      Mobility Bed Mobility               General bed mobility comments: oob  Transfers Overall transfer level: Needs assistance Equipment used: Rolling walker (2 wheeled) Transfers: Sit to/from Stand Sit to Stand: Mod assist;+2 physical assistance         General transfer comment: assist to power up and balance    Balance Overall balance assessment: Needs assistance         Standing balance support: Bilateral upper extremity supported Standing balance-Leahy Scale: Poor                             ADL either performed or assessed with clinical judgement   ADL Overall ADL's : Needs assistance/impaired Eating/Feeding: Independent   Grooming: Set up;Sitting   Upper Body Bathing: Set up;Sitting   Lower Body Bathing: Moderate assistance;Sit to/from stand;+2 for physical assistance   Upper Body Dressing : Minimal assistance;Sitting   Lower Body Dressing: Moderate assistance;+2 for physical assistance;Sit to/from stand   Toilet Transfer: Moderate assistance;+2 for physical assistance;Ambulation(for sit to stand; then min A)             General ADL Comments: pt needs mod +2 assistance for sit to stand. Fatiques easily  Sats stayed in  mid 90s but dsypnea 2/4 with ambulating. PT wil see if ortho tech can get pt a new Darko boot for transmetatarsal ampuation; hers is falling apart     Vision         Perception     Praxis      Pertinent Vitals/Pain Pain Assessment: Faces Faces Pain Scale: Hurts little more Pain Location: abdomen, L LE with WBing Pain Descriptors / Indicators: Aching;Discomfort;Sore Pain Intervention(s): Limited activity within patient's tolerance;Repositioned     Hand Dominance     Extremity/Trunk Assessment Upper Extremity Assessment Upper Extremity Assessment: Generalized weakness(LUE to 90 then painful)           Communication Communication Communication: No difficulties   Cognition Arousal/Alertness: Awake/alert Behavior During Therapy: WFL for tasks assessed/performed Overall Cognitive Status: Within Functional Limits for tasks assessed                                     General Comments       Exercises     Shoulder Instructions      Home Living Family/patient expects to be discharged to:: Skilled nursing facility                                 Additional Comments:  daughter works in the day      Prior Functioning/Environment Level of Independence: Independent                 OT Problem List: Decreased strength;Decreased activity tolerance;Impaired balance (sitting and/or standing);Cardiopulmonary status limiting activity      OT Treatment/Interventions: Self-care/ADL training;Balance training;Patient/family education;Therapeutic activities;DME and/or AE instruction;Therapeutic exercise ;energy conservation   OT Goals(Current goals can be found in the care plan section) Acute Rehab OT Goals Patient Stated Goal: to regain PLOF/independence OT Goal Formulation: With patient Time For Goal Achievement: 01/15/18 Potential to Achieve Goals: Good ADL Goals Pt Will Perform Lower Body Bathing: with min assist;sit to/from stand Pt Will  Perform Lower Body Dressing: with min assist;sit to/from stand Pt Will Transfer to Toilet: with min assist;ambulating;bedside commode Pt Will Perform Toileting - Clothing Manipulation and hygiene: with min assist;sit to/from stand Additional ADL Goal #1: pt will initiate at least one rest break during adls for energy conservation  OT Frequency: Min 2X/week   Barriers to D/C:            Co-evaluation PT/OT/SLP Co-Evaluation/Treatment: Yes Reason for Co-Treatment: For patient/therapist safety PT goals addressed during session: Mobility/safety with mobility OT goals addressed during session: ADL's and self-care      AM-PAC PT "6 Clicks" Daily Activity     Outcome Measure Help from another person eating meals?: None Help from another person taking care of personal grooming?: A Little Help from another person toileting, which includes using toliet, bedpan, or urinal?: A Lot Help from another person bathing (including washing, rinsing, drying)?: A Lot Help from another person to put on and taking off regular upper body clothing?: A Little Help from another person to put on and taking off regular lower body clothing?: A Little 6 Click Score: 17   End of Session    Activity Tolerance: Patient limited by fatigue Patient left: in chair;with call bell/phone within reach;with chair alarm set  OT Visit Diagnosis: Muscle weakness (generalized) (M62.81)                Time: 2248-2500 OT Time Calculation (min): 20 min Charges:  OT General Charges $OT Visit: 1 Visit OT Evaluation $OT Eval Mod Complexity: 1 Low G-Codes:     [place], OTR/L [phone] 01/01/2018  [name] 01/01/2018, 2:27 PM

## 2018-01-01 NOTE — Care Management Note (Signed)
Case Management Note  Patient Details  Name: Stefanie Vincent MRN: [id number] Date of Birth: [birth year]  Subjective/Objective: Pt admitted with Influenza A                   Action/Plan: Plan to discharge to SNF   Expected Discharge Date:                  Expected Discharge Plan:  Skilled Nursing Facility  In-House Referral:  Clinical Social Work  Discharge planning Services  CM Consult  Post Acute Care Choice:    Choice offered to:  Patient  DME Arranged:    DME Agency:     HH Arranged:    [place] Agency:     Status of Service:  In process, will continue to follow  If discussed at Long Length of Stay Meetings, dates discussed:    Additional CommentsPurcell Mouton, RN 01/01/2018, 12:24 PM

## 2018-01-01 NOTE — Progress Notes (Addendum)
Foley removed at [place] on 12/31/2017. Patient unable to urinate. Bladder scanned patient at 0130- 15ml shown. Paged Baltazar Najjar, NP. New order for NS at 64ml/hr. Continue to monitor.

## 2018-01-01 NOTE — Progress Notes (Signed)
Physical Therapy Treatment Patient Details Name: Stefanie Vincent MRN: [id number] DOB: [birth year] Today's Date: 01/01/2018    History of Present Illness 77 yo female admitted with Pna, +flu. hx of HTN, DM, COPD.     PT Comments    Progressing slowly with mobility. Continues to require +2 for safety when mobilizing. Pt fatigues very easily with activity. Remained on 4L [place] O2 during session. Continue to recommend SNF.    Follow Up Recommendations  SNF     Equipment Recommendations  (TBD at next venue)    Recommendations for Other Services OT consult     Precautions / Restrictions Precautions Precautions: Fall Precaution Comments: droplet; monitor O2 sat Restrictions Weight Bearing Restrictions:  No    Mobility  Bed Mobility               General bed mobility comments: oob in recliner  Transfers Overall transfer level: Needs assistance Equipment used: Rolling walker (2 wheeled) Transfers: Sit to/from Stand Sit to Stand: Mod assist         General transfer comment: Assist to rise, stabilize, control descent. VCs safety, technique, hand placement. Posterior bias. Increased time for pt to gain stability once standing   Ambulation/Gait Ambulation/Gait assistance: Min assist;+2 physical assistance;+2 safety/equipment Ambulation Distance (Feet): 7 Feet(7'x1, 4'x1) Assistive device: Rolling walker (2 wheeled) Gait Pattern/deviations: Step-to pattern     General Gait Details: Still unsteady but some improvement noted. +2 for safety/equipment. O2 sat 97% on 4L [place] during ambulation.   Stairs            Wheelchair Mobility    Modified Rankin (Stroke Patients Only)       Balance Overall balance assessment: Needs assistance         Standing balance support: Bilateral upper extremity supported Standing balance-Leahy Scale: Poor                              Cognition Arousal/Alertness: Awake/alert Behavior During Therapy: WFL for tasks  assessed/performed Overall Cognitive Status: Within Functional Limits for tasks assessed                                        Exercises      General Comments        Pertinent Vitals/Pain Pain Assessment: Faces Faces Pain Scale: Hurts little more Pain Location: abdomen, L LE with WBing Pain Descriptors / Indicators: Aching;Discomfort;Sore Pain Intervention(s): Limited activity within patient's tolerance;Repositioned    Home Living Family/patient expects to be discharged to:: (P) Skilled nursing facility               Additional Comments:  daughter works in the day    Prior Function Level of Independence:  Independent          PT Goals (current goals can now be found in the care plan section) Progress towards PT goals: Progressing toward goals    Frequency    Min 3X/week      PT Plan Current plan remains appropriate    Co-evaluation   Reason for Co-Treatment: (P) For patient/therapist safety PT goals addressed during session: (P) Mobility/safety with mobility OT goals addressed during session: (P) ADL's and self-care      AM-PAC PT "6 Clicks" Daily Activity  Outcome Measure  Difficulty turning over in bed (including adjusting bedclothes, sheets and blankets)?: A Lot Difficulty  moving from lying on back to sitting on the side of the bed? : A Lot Difficulty sitting down on and standing up from a chair with arms (e.g., wheelchair, bedside commode, etc,.)?: Unable Help needed moving to and from a bed to chair (including a wheelchair)?: A Lot Help needed walking in hospital room?: A Lot Help needed climbing 3-5 steps with a railing? : Total 6 Click Score: 10    End of Session Equipment Utilized During Treatment: Gait belt;Oxygen Activity Tolerance: Patient limited by fatigue Patient left: in chair;with call bell/phone within reach   PT Visit Diagnosis: Muscle weakness (generalized) (M62.81);Difficulty in walking, not elsewhere  classified (R26.2);Unsteadiness on feet (R26.81);Other abnormalities of gait and mobility (R26.89)     Time: 0488-8916 PT Time Calculation (min) (ACUTE ONLY): 21 min  Charges:  $Gait Training: 8-22 mins                    G Codes:          [place]on Anna, MPT Pager: [phone]

## 2018-01-02 DIAGNOSIS — E1165 Type 2 diabetes mellitus with hyperglycemia: Secondary | ICD-10-CM

## 2018-01-02 DIAGNOSIS — Z794 Long term (current) use of insulin: Secondary | ICD-10-CM

## 2018-01-02 LAB — GLUCOSE, CAPILLARY
Glucose-Capillary: 103 mg/dL — ABNORMAL HIGH (ref 65–99)
Glucose-Capillary: 164 mg/dL — ABNORMAL HIGH (ref 65–99)

## 2018-01-02 MED ORDER — ALBUTEROL SULFATE (2.5 MG/3ML) 0.083% IN NEBU: 2.5000 mg | INHALATION_SOLUTION | RESPIRATORY_TRACT | 12 refills | Status: DC | PRN

## 2018-01-02 MED ORDER — FLUTICASONE-SALMETEROL 250-50 MCG/DOSE IN AEPB: 1.0000 | INHALATION_SPRAY | Freq: Two times a day (BID) | RESPIRATORY_TRACT | 0 refills | Status: DC

## 2018-01-02 MED ORDER — ALBUTEROL SULFATE (2.5 MG/3ML) 0.083% IN NEBU: 2.5000 mg | INHALATION_SOLUTION | Freq: Four times a day (QID) | RESPIRATORY_TRACT | 12 refills | Status: AC | PRN

## 2018-01-02 MED ORDER — POLYETHYLENE GLYCOL 3350 17 G PO PACK: 17.0000 g | PACK | Freq: Every day | ORAL | 0 refills | Status: AC | PRN

## 2018-01-02 NOTE — Clinical Social Work Placement (Signed)
    2:13 PM Patient and family chose bed at [facility]  LCSW confirmed bed with facility.  Patient will transport by [facility].   LCSW faxed dc docs to facility.   RN report number: [id number]  [place] WORK PLACEMENT  NOTE  Date:  01/02/2018  Patient Details  Name: Stefanie Vincent MRN: [id number] Date of Birth: Mar 30, 1941  Clinical Social Work is seeking post-discharge placement for this patient at the [place] level of care (*CSW will initial, date and re-position this form in  chart as items are completed):  Yes   Patient/family provided with [facility] Work Department's list of facilities offering this level of care within the geographic area requested by the patient (or if unable, by the patient's family).  Yes   Patient/family informed of their freedom to choose among providers that offer the needed level of care, that participate in Medicare, Medicaid or managed care program needed by the patient, have an available bed and are willing to accept the patient.  Yes   Patient/family informed of [facility] ownership interest in [facility] and [facility], as well as of the fact that they are under no obligation to receive care at these facilities.  PASRR submitted to EDS on       PASRR number received on 01/02/18     Existing PASRR number confirmed on       FL2 transmitted to all facilities in geographic area requested by pt/family on 01/01/18     FL2 transmitted to all facilities within larger geographic area on       Patient informed that his/her managed care company has contracts with or will negotiate with certain facilities, including the following:        Yes   Patient/family informed of bed offers received.  Patient chooses bed at [facility]     Physician recommends and patient chooses bed at [facility]    Patient to be transferred to [facility] on  01/02/18.  Patient to be transferred to facility by EMS     Patient family notified on 01/02/18 of transfer.  Name of family member notified:  [name], Daughter     PHYSICIAN       Additional Comment:    _______________________________________________ Servando Snare, LCSW 01/02/2018, 2:13 PM

## 2018-01-02 NOTE — Discharge Summary (Signed)
Physician Discharge Summary  HELIA HAESE WFU:[id number] DOB: [birth year] DOA: 12/26/2017  PCP: Helane Rima, MD  Admit date: 12/26/2017 Discharge date: 01/02/2018  Admitted From: Home Disposition:  SNF  Recommendations for Outpatient Follow-up and new medication changes:  1. Follow up with PCP in 1- weeks 2. Patient has been placed on inhaled corticosteroids and long acting B agonist 3. Resume inhaled tiotropium 4. Supplemental 02 per [place] 4 LPM to keep 02 saturation greater than 88%  Home Health: na  Equipment/Devices: na   Discharge Condition: stable CODE STATUS: full  Diet recommendation: Heart healthy and diabetic prudent.  Brief/Interim Summary: 77 year old female who presented with dyspnea. She does have asignificant past medical history of COPD, and hypertension. Presents with 7-day course of progressive and worsening dyspnea, associated with cough, and generalized weakness. Positive sick contacts at home,with influenza. On the initial physical examination her oximetry was down to 80% on room air at her home, inthe emergency department her blood pressure 133/50, heart rate 51, respiratoryrate18, temperature 98.4, oximetry 95% on supplemental oxygen. Moist mucous membranes,coarse breath sounds bilaterally, positive wheezing, no rhonchi, heart S1-S2 present rhythmic, no gallops, rubs or murmurs, abdomen soft nontender, no lower extremity edema.  Sodium 133, potassium 3.6, chloride 96, bicarb 24, glucose 118, BUN 29, creatinine 1.15, white count 6.2, hemoglobin 11.1, hematocrit 33.4, platelets 175.  Influenza A+.  Urinalysis negative for infection, 100 protein, specific gravity 1.025.  Urine Streptococcus pneumonia antigen negative.  Chest x-ray negative for infiltrates, positive for hyperinflation, CT chest with bilateral faint groundglass opacities.  EKG sinus rhythm, poor R wave progression, low voltage.  Patient was admitted to the hospital with the working diagnosis of  acute hypoxic respiratory failure due to influenza A complicated by community-acquired pneumonia.  1.  Acute hypoxic respiratory failure due to influenza A, complicated by community-acquired pneumonia/viral pneumonia.  Patient was admitted to the medical ward, she was placed on a remote telemetry monitor, received supplemental oxygen per nasal cannula, aggressive bronchodilator therapy, systemic steroids, with improvement of her symptoms.  She completed antibiotic therapy in the hospital with ceftriaxone and azithromycin #7, she received oseltamivir for 5 days.   2.  Acute cor pulmonale, with acute pulmonary edema, present on admission.  Patient was placed on short course of diuresis, negative fluid balance was achieved, with a significant improvement of her symptoms.  No diuretics prescribed at discharge.  Further workup with echocardiography showed left ventricular ejection fraction 60-65% with mild LVH, normal wall motion.  Peak PA pressure was 24 mmHg.   3.  Acute COPD exacerbation.  Responded well to bronchodilator therapy, she will be resumed on long-acting anticholinergic with tiotropium, started on inhaled corticosteroid and long-acting beta-2 agonist.  Continue submental oxygen per nasal cannula, titrate O2 saturation greater than 88%. At discharge she is on 4 L per nasal cannula with oxygen saturation 95%.  Rescue inhaler/nebulizer with albuterol.  She was seen by physical therapy, she was found to be weak and deconditioned, recommendation to follow-up at a skilled nursing facility.   4.  Type 2 diabetes mellitus.  Patient was continue on insulin regimen is 70 3010 units twice daily plus insulin sliding scale, systemic steroids were tapered off during her hospitalization.   5.  Hypertension.  Patient was continue on atenolol with good blood pressure control.  Will resume losartan at discharge.  Discharge Diagnoses:  Principal Problem:   Influenza A Active Problems:   Type 2 diabetes  mellitus ([place])   Essential hypertension   CAP (community acquired  pneumonia)   Acute respiratory failure with hypoxia (HCC)   Orthostatic hypotension    Discharge Instructions   Allergies as of 01/02/2018      Reactions   Codeine    Makes patient depressed.      Medication List    STOP taking these medications   albuterol 108 (90 Base) MCG/ACT inhaler Commonly known as:  PROVENTIL HFA;VENTOLIN HFA Replaced by:  albuterol (2.5 MG/3ML) 0.083% nebulizer solution   Potassium Chloride ER 20 MEQ Tbcr   sulfamethoxazole-trimethoprim 800-160 MG tablet Commonly known as:  BACTRIM DS,SEPTRA DS     TAKE these medications   albuterol (2.5 MG/3ML) 0.083% nebulizer solution Commonly known as:  PROVENTIL Take 3 mLs (2.5 mg total) by nebulization every 2 (two) hours as needed for wheezing or shortness of breath. Replaces:  albuterol 108 (90 Base) MCG/ACT inhaler   albuterol (2.5 MG/3ML) 0.083% nebulizer solution Commonly known as:  PROVENTIL Take 3 mLs (2.5 mg total) by nebulization every 6 (six) hours as needed for wheezing or shortness of breath.   aspirin 81 MG chewable tablet Chew 81 mg by mouth daily.   atenolol 50 MG tablet Commonly known as:  TENORMIN Take 1 tablet (50 mg total) by mouth daily.   cholestyramine 4 g packet Commonly known as:  QUESTRAN Take 1 packet (4 g total) by mouth 2 (two) times daily.   fluticasone 50 MCG/ACT nasal spray Commonly known as:  FLONASE Place 1 spray into both nostrils daily.   Fluticasone-Salmeterol 250-50 MCG/DOSE Aepb Commonly known as:  ADVAIR DISKUS Inhale 1 puff into the lungs 2 (two) times daily.   insulin aspart protamine- aspart (70-30) 100 UNIT/ML injection Commonly known as:  NOVOLOG MIX 70/30 Inject 0.1 mLs (10 Units total) into the skin 2 (two) times daily with a meal.   loratadine 10 MG tablet Commonly known as:  CLARITIN Take 10 mg by mouth daily.   losartan 25 MG tablet Commonly known as:  COZAAR Take 1 tablet  (25 mg total) by mouth daily. What changed:  Another medication with the same name was removed. Continue taking this medication, and follow the directions you see here.   omeprazole 20 MG capsule Commonly known as:  PRILOSEC Take 20 mg by mouth daily as needed (allergies).   polyethylene glycol packet Commonly known as:  MIRALAX / GLYCOLAX Take 17 g by mouth daily as needed for moderate constipation.   pravastatin 40 MG tablet Commonly known as:  PRAVACHOL Take 40 mg by mouth daily.   tiotropium 18 MCG inhalation capsule Commonly known as:  SPIRIVA Place 1 capsule (18 mcg total) into inhaler and inhale daily.   vitamin D (CHOLECALCIFEROL) 400 units tablet Take 400 Units by mouth daily.       Allergies  Allergen Reactions  . Codeine     Makes patient depressed.    Consultations:     Procedures/Studies: Ct Angio Chest Pe W And/or Wo Contrast  Result Date: 12/26/2017 CLINICAL DATA:  77 year old female with nausea and vomiting. Shortness of breath and cough. Suspected pulmonary embolus. EXAM: CT ANGIOGRAPHY CHEST WITH CONTRAST TECHNIQUE: Multidetector CT imaging of the chest was performed using the standard protocol during bolus administration of intravenous contrast. Multiplanar CT image reconstructions and MIPs were obtained to evaluate the vascular anatomy. CONTRAST:  122mL ISOVUE-370 IOPAMIDOL (ISOVUE-370) INJECTION 76% COMPARISON:  Portable chest radiograph 0905 hr today. CT Abdomen and Pelvis today reported separately. FINDINGS: Cardiovascular: Excellent contrast bolus timing in the pulmonary arterial tree. Respiratory motion in the  lower lobes. No central, hilar, or segmental pulmonary artery filling defect. The bilateral upper lobe pulmonary arteries appear patent. The sub segmental and distal lower lobe pulmonary artery detail is obscured by motion. No filling defect is identified. Mild cardiomegaly. No pericardial effusion. Calcified aortic atherosclerosis. And calcified  coronary artery atherosclerosis. Bulky great vessel origin calcified plaque. No definite great vessel origin occlusion. Mediastinum/Nodes: Negative.  No lymphadenopathy. Lungs/Pleura: Atelectatic changes to the trachea and major airways. Scattered bilateral small peripheral and occasionally peribronchial areas of pulmonary ground-glass opacity. The upper lobes are most affected, but there is definite right lower lobe involvement. No consolidation. No pleural effusion. Upper Abdomen: Abdomen findings are reported separately with the CT Abdomen and Pelvis today. Musculoskeletal: Osteopenia and degenerative changes throughout the thoracic spine. No acute osseous abnormality identified. Review of the MIP images confirms the above findings. IMPRESSION: 1. Distal lower lobe pulmonary artery detail is obscured by respiratory motion, but otherwise negative for acute pulmonary embolus. 2. Scattered bilateral upper lobe and peripheral predominant pulmonary ground-glass opacity is nonspecific. Consider acute bilateral viral/atypical distal airway infection. No pleural effusion. 3. Extensive Aortic Atherosclerosis (ICD10-I70.0) and calcified coronary artery atherosclerosis. 4. See also CT Abdomen and Pelvis today reported separately. Electronically Signed   By: Genevie Ann M.D.   On: 12/26/2017 10:59   Ct Abdomen Pelvis W Contrast  Result Date: 12/26/2017 CLINICAL DATA:  77 year old female with nausea and vomiting. Shortness of breath and cough. EXAM: CT ABDOMEN AND PELVIS WITH CONTRAST TECHNIQUE: Multidetector CT imaging of the abdomen and pelvis was performed using the standard protocol following bolus administration of intravenous contrast. CONTRAST:  125mL ISOVUE-370 IOPAMIDOL (ISOVUE-370) INJECTION 76% COMPARISON:  Chest CTA today reported separately. FINDINGS: Lower chest: Stable with regard to the chest CTA findings today reported separately, including small areas of peripheral ground-glass opacity in the right lower  lobe. Hepatobiliary: Surgically absent gallbladder. Liver and biliary tree within normal limits. Pancreas: Chronic calcific pancreatitis and pancreatic atrophy. Associated dilatation of the main pancreatic duct in the tail. No pancreatic mass or inflammation identified. Spleen: Negative. Adrenals/Urinary Tract: Normal adrenal glands. Bilateral renal contrast enhancement and excretion is symmetric and normal. Renal hilar vascular calcifications suspected, no definite nephrolithiasis. Small left renal midpole simple cyst. No perinephric stranding. Negative ureters. Unremarkable urinary bladder. Stomach/Bowel: Negative rectum. Redundant but otherwise negative sigmoid colon. Negative left colon and transverse colon. Negative right colon and retrocecal appendix. Negative terminal ileum. No dilated or abnormal small bowel loops identified. The stomach is largely decompressed. Negative duodenum. No abdominal free air or free fluid. Vascular/Lymphatic: Extensive aortoiliac calcified atherosclerosis. Calcified atherosclerosis involving major aortic branches including the distal SMA (series 4, image 34). Despite this, major arterial structures in the abdomen and pelvis remain patent. Portal venous system is patent. No lymphadenopathy. Reproductive: Negative. Other: No pelvic free fluid. Musculoskeletal: Advanced lumbar spine degeneration. No acute osseous abnormality identified. IMPRESSION: 1. No acute or inflammatory process identified in the abdomen or pelvis. 2. Extensive aortoiliac atherosclerosis. The major arterial structures remain patent. 3. Advanced lumbar spine degeneration. Electronically Signed   By: Genevie Ann M.D.   On: 12/26/2017 11:05   Dg Chest Port 1 View  Result Date: 12/27/2017 CLINICAL DATA:  Dyspnea EXAM: PORTABLE CHEST 1 VIEW COMPARISON:  12/26/2017 chest radiograph. FINDINGS: Stable cardiomediastinal silhouette with normal heart size. No pneumothorax. No pleural effusion. No overt pulmonary edema.  Mild hazy reticular opacities at the lung bases. No acute consolidative airspace disease. IMPRESSION: No acute consolidative airspace disease. Mild hazy reticular bibasilar  lung opacities, favor atelectasis or bronchiolitis. Electronically Signed   By: Ilona Sorrel M.D.   On: 12/27/2017 16:58   Dg Chest Port 1 View  Result Date: 12/26/2017 CLINICAL DATA:  Shortness of breath and cough EXAM: PORTABLE CHEST 1 VIEW COMPARISON:  None. FINDINGS: Lungs are clear. The heart size and pulmonary vascularity are normal. No adenopathy. There is aortic atherosclerosis. No bone lesions. IMPRESSION: Aortic atherosclerosis.  No edema or consolidation. Aortic Atherosclerosis (ICD10-I70.0). Electronically Signed   By: Lowella Grip III M.D.   On: 12/26/2017 09:27       Subjective: Patient is feeling better, her dyspnea slowly improving, no nausea or vomiting, no chest pain.  Discharge Exam: Vitals:   01/02/18 0500 01/02/18 0813  BP: (!) 162/58   Pulse: (!) 57   Resp: 18   Temp: 97.9 F (36.6 C)   SpO2: 92% 95%   Vitals:   01/01/18 2001 01/01/18 2040 01/02/18 0500 01/02/18 0813  BP:  (!) 148/49 (!) 162/58   Pulse:  (!) 54 (!) 57   Resp:  18 18   Temp:  98 F (36.7 C) 97.9 F (36.6 C)   TempSrc:  Oral Oral   SpO2: 92% 94% 92% 95%  Weight:      Height:        General: Not in pain or dyspnea, deconditioned Neurology: Awake and alert, non focal  E ENT: mild pallor, no icterus, oral mucosa moist Cardiovascular: No JVD. S1-S2 present, rhythmic, no gallops, rubs, or murmurs. No lower extremity edema. Pulmonary: decreased breath sounds bilaterally, decreased air movement, no wheezing, scattered rhonchi and rales. Prolonged expiratory phase.  Gastrointestinal. Abdomen  no organomegaly, non tender, no rebound or guarding Skin. No rashes Musculoskeletal: no joint deformities   The results of significant diagnostics from this hospitalization (including imaging, microbiology, ancillary and  laboratory) are listed below for reference.     Microbiology: Recent Results (from the past 240 hour(s))  Blood culture (routine x 2)     Status: None   Collection Time: 12/26/17  9:35 AM  Result Value Ref Range Status   Specimen Description   Final    BLOOD LEFT ANTECUBITAL Performed at [facility] [address] [place], [place] [address]    Special Requests   Final    BOTTLES DRAWN AEROBIC AND ANAEROBIC Blood Culture adequate volume Performed at [facility] [address] [place], [place] [address]    Culture   Final    NO GROWTH 5 DAYS Performed at [facility], [place] [address], [place], [place] [address]    Report Status 12/31/2017 FINAL  Final  Urine culture     Status: None   Collection Time: 12/26/17  9:35 AM  Result Value Ref Range Status   Specimen Description   Final    URINE, RANDOM Performed at [facility] [address] [place], [place] [address]    Special Requests   Final    NONE Performed at [facility], [place] [address] [place], [place] [address]    Culture   Final    NO GROWTH Performed at [facility], [place] [address], [place], [place] [address]    Report Status 12/29/2017 FINAL  Final  Blood culture (routine x 2)     Status: None   Collection Time: 12/26/17  9:36 AM  Result Value Ref Range Status   Specimen Description   Final    BLOOD RIGHT ANTECUBITAL  Performed at [facility], [place] [address], [place], [place] [address]    Special Requests   Final    BOTTLES DRAWN AEROBIC AND ANAEROBIC Blood Culture adequate volume Performed at [facility] [address] [place], [place] [address]    Culture   Final    NO GROWTH 5 DAYS Performed at [facility], [place] [address], [place], [place] [address]    Report Status 12/31/2017 FINAL  Final     Labs: BNP (last 3 results) Recent Labs    12/27/17 1655  BNP  810.1*   Basic Metabolic Panel: Recent Labs  Lab 12/27/17 0625 12/28/17 0621 12/29/17 0627 12/30/17 1504 12/31/17 0555  NA 136 135 135 133* 133*  K 3.6 3.7 3.4* 3.7 3.7  CL 103 98* 95* 91* 93*  CO2 24 25 26 29 29   GLUCOSE 96 313* 293* 282* 277*  BUN 20 17 22* 33* 37*  CREATININE 0.96 0.80 0.79 0.99 0.92  CALCIUM 8.3* 8.9 9.0 9.2 9.1   Liver Function Tests: No results for input(s): AST, ALT, ALKPHOS, BILITOT, PROT, ALBUMIN in the last 168 hours. No results for input(s): LIPASE, AMYLASE in the last 168 hours. No results for input(s): AMMONIA in the last 168 hours. CBC: Recent Labs  Lab 12/27/17 0625 12/28/17 0621  WBC 5.8 3.6*  NEUTROABS 4.1  --   HGB 10.2* 11.8*  HCT 30.8* 35.6*  MCV 88.5 85.0  PLT 173 186   Cardiac Enzymes: No results for input(s): CKTOTAL, CKMB, CKMBINDEX, TROPONINI in the last 168 hours. BNP: Invalid input(s): POCBNP CBG: Recent Labs  Lab 01/01/18 0733 01/01/18 1114 01/01/18 1640 01/01/18 2119 01/02/18 0728  GLUCAP 93 242* 101* 110* 103*   D-Dimer No results for input(s): DDIMER in the last 72 hours. Hgb A1c No results for input(s): HGBA1C in the last 72 hours. Lipid Profile No results for input(s): CHOL, HDL, LDLCALC, TRIG, CHOLHDL, LDLDIRECT in the last 72 hours. Thyroid function studies No results for input(s): TSH, T4TOTAL, T3FREE, THYROIDAB in the last 72 hours.  Invalid input(s): FREET3 Anemia work up No results for input(s): VITAMINB12, FOLATE, FERRITIN, TIBC, IRON, RETICCTPCT in the last 72 hours. Urinalysis    Component Value Date/Time   COLORURINE YELLOW 12/26/2017 0935   APPEARANCEUR CLEAR 12/26/2017 0935   LABSPEC 1.025 12/26/2017 0935   PHURINE 5.0 12/26/2017 0935   GLUCOSEU 150 (A) 12/26/2017 0935   HGBUR NEGATIVE 12/26/2017 0935   BILIRUBINUR NEGATIVE 12/26/2017 0935   KETONESUR NEGATIVE 12/26/2017 0935   PROTEINUR 100 (A) 12/26/2017 0935   UROBILINOGEN 0.2 04/11/2015 0210   NITRITE NEGATIVE 12/26/2017 0935    LEUKOCYTESUR NEGATIVE 12/26/2017 0935   Sepsis Labs Invalid input(s): PROCALCITONIN,  WBC,  LACTICIDVEN Microbiology Recent Results (from the past 240 hour(s))  Blood culture (routine x 2)     Status: None   Collection Time: 12/26/17  9:35 AM  Result Value Ref Range Status   Specimen Description   Final    BLOOD LEFT ANTECUBITAL Performed at [facility], [place] [address], [place], [place] [address]    Special Requests   Final    BOTTLES DRAWN AEROBIC AND ANAEROBIC Blood Culture adequate volume Performed at [facility] [address] [place], [place] [address]    Culture   Final    NO GROWTH 5 DAYS Performed at [facility], [place] [address] [place], [place] [address]    Report Status 12/31/2017 FINAL  Final  Urine  culture     Status: None   Collection Time: 12/26/17  9:35 AM  Result Value Ref Range Status   Specimen Description   Final    URINE, RANDOM Performed at [facility] [address], [place], [place] [address]    Special Requests   Final    NONE Performed at [facility], [place] [address], [place], [place] [address]    Culture   Final    NO GROWTH Performed at [facility], [place] [address] [place], [place] [address]    Report Status 12/29/2017 FINAL  Final  Blood culture (routine x 2)     Status: None   Collection Time: 12/26/17  9:36 AM  Result Value Ref Range Status   Specimen Description   Final    BLOOD RIGHT ANTECUBITAL Performed at [facility] [address] [place], [place] [address]    Special Requests   Final    BOTTLES DRAWN AEROBIC AND ANAEROBIC Blood Culture adequate volume Performed at [facility] [address], [place], [place] [address]    Culture   Final    NO GROWTH 5 DAYS Performed at [facility], [place] [address], [place], [place] [address]    Report Status 12/31/2017 FINAL  Final     Time  coordinating discharge: 45 minutes  SIGNED:   Tawni Millers, MD  [facility] 01/02/2018, 11:39 AM Pager [phone]  If 7PM-7AM, please contact night-coverage [email or web address] Password [email or web address]

## 2018-01-02 NOTE — Progress Notes (Signed)
Report called to [facility]. IV removed. AVS provided to EMS. Patient Advance.

## 2018-01-02 NOTE — Progress Notes (Signed)
Physical Therapy Treatment Patient Details Name: Stefanie Vincent MRN: [id number] DOB: May 27, 1941 Today's Date: 01/02/2018    History of Present Illness 77 yo female admitted with Pna, +flu. hx of HTN, DM, COPD.     PT Comments    Pt OOB on 4 lts at 95%.  Assisted with amb pt remains unsteady and limited distance.    Follow Up Recommendations  SNF     Equipment Recommendations       Recommendations for Other Services       Precautions / Restrictions Precautions Precautions: Fall Precaution Comments: droplet; monitor O2 sat  R partial foot amp(old0 Restrictions Weight Bearing Restrictions: No    Mobility  Bed Mobility               General bed mobility comments: OOB in recliner  Transfers Overall transfer level: Needs assistance Equipment used: Rolling walker (2 wheeled) Transfers: Sit to/from Stand Sit to Stand: Mod assist         General transfer comment: 50% VC's on proper hand placement and initial posterior lean/LOB  Ambulation/Gait Ambulation/Gait assistance: Min assist Ambulation Distance (Feet): 18 Feet Assistive device: Rolling walker (2 wheeled) Gait Pattern/deviations: Step-to pattern Gait velocity: decreased   General Gait Details: unsteady gait and poor forward flex posture.  Remained on 4 lts    Stairs            Wheelchair Mobility    Modified Rankin (Stroke Patients Only)       Balance                                            Cognition Arousal/Alertness: Awake/alert Behavior During Therapy: WFL for tasks assessed/performed Overall Cognitive Status: Within Functional Limits for tasks assessed                                        Exercises      General Comments        Pertinent Vitals/Pain Pain Assessment: No/denies pain    Home Living                      Prior Function            PT Goals (current goals can now be found in the care plan section) Progress  towards PT goals: Progressing toward goals    Frequency    Min 3X/week      PT Plan Current plan remains appropriate    Co-evaluation              AM-PAC PT "6 Clicks" Daily Activity  Outcome Measure  Difficulty turning over in bed (including adjusting bedclothes, sheets and blankets)?: A Lot Difficulty moving from lying on back to sitting on the side of the bed? : A Lot Difficulty sitting down on and standing up from a chair with arms (e.g., wheelchair, bedside commode, etc,.)?: Unable Help needed moving to and from a bed to chair (including a wheelchair)?: A Lot Help needed walking in hospital room?: A Lot Help needed climbing 3-5 steps with a railing? : Total 6 Click Score: 10    End of Session Equipment Utilized During Treatment: Gait belt;Oxygen Activity Tolerance: Patient limited by fatigue Patient left: in chair;with call bell/phone within reach Nurse Communication: Mobility  status PT Visit Diagnosis: Muscle weakness (generalized) (M62.81);Difficulty in walking, not elsewhere classified (R26.2);Unsteadiness on feet (R26.81);Other abnormalities of gait and mobility (R26.89)     Time: 1105-1130 PT Time Calculation (min) (ACUTE ONLY): 25 min  Charges:  $Gait Training: 8-22 mins $Therapeutic Activity: 8-22 mins                    G Codes:       {Maranda Marte  PTA [facility]  Acute  Rehab Pager      [phone]

## 2018-01-02 NOTE — Clinical Social Work Note (Signed)
Clinical Social Work Assessment  Patient Details  Name: Stefanie Vincent MRN: [id number] Date of Birth: [birth year]  Date of referral:  01/02/18               Reason for consult:  Facility Placement                Permission sought to share information with:  Case Manager, Customer service manager, Family Supports Permission granted to share information::  Yes, Verbal Permission Granted  Name::     Nutritional therapist::  SNF  Relationship::  daughter  Contact Information:     Housing/Transportation Living arrangements for the past 2 months:  Single Family Home Source of Information:  Adult Children Patient Interpreter Needed:  None Criminal Activity/Legal Involvement Pertinent to Current Situation/Hospitalization:  No - Comment as needed Significant Relationships:  Adult Children Lives with:  Adult Children Do you feel safe going back to the place where you live?  Yes Need for family participation in patient care:  Yes (Comment)  Care giving concerns:  No care giving concerns at the time of assessment.    Social Worker assessment / plan:  LCSW consulted for SNF placemrnt.   LCSW spoke with patients daughter by phone. LCSW attempted to meet with patient several times.   Patient's daughter, Stefanie Vincent reports that patient lives with her. Stefanie Vincent reports that prior to hospital visit patient was independent in ADLs including cooking and cleaning daily. Patient does not drive. Stefanie Vincent reports that patient ambulates without assistance, however she does have DMEs at home.   Stefanie Vincent reports that patient was in rehab before in [facility] and would like something similar as patient had a good experience.   PLAN: Patient will go to SNF at dc.    Employment status:    Insurance information:  Medicare PT Recommendations:  [place] / Referral to community resources:     Patient/Family's Response to care:  Family is thankful for [employer].    Patient/Family's Understanding of and Emotional Response to Diagnosis, Current Treatment, and Prognosis:  Patient and family are understanding of diagnosis and agreeable to treatment plan.   Emotional Assessment Appearance:  Appears stated age Attitude/Demeanor/Rapport:    Affect (typically observed):  Calm Orientation:  Oriented to Self, Oriented to Place, Oriented to  Time, Oriented to Situation Alcohol / Substance use:  Not Applicable Psych involvement (Current and /or in the community):  No (Comment)  Discharge Needs  Concerns to be addressed:  No discharge needs identified Readmission within the last 30 days:    Current discharge risk:  None Barriers to Discharge:  Continued Medical Work up   [employer], LCSW 01/02/2018, 10:26 AM

## 2018-01-02 NOTE — NC FL2 (Signed)
[place] MEDICAID [id number] LEVEL OF CARE SCREENING TOOL     IDENTIFICATION  Patient Name: Stefanie Vincent Birthdate: [birth year] Sex: female Admission Date (Current Location): 12/26/2017  [facility] and [place] Number:  Herbalist and Address:  [facility],  [address] [place], [place]      Provider Number: [id number]  Attending Physician Name and Address:  Tawni Millers  Relative Name and Phone Number:       Current Level of Care: Hospital Recommended Level of Care: [place] Prior Approval Number:    Date Approved/Denied: 01/02/18 PASRR Number: [id number]  Discharge Plan: SNF    Current Diagnoses: Patient Active Problem List   Diagnosis Date Noted  . Influenza A 12/26/2017  . CAP (community acquired pneumonia) 12/26/2017  . Acute respiratory failure with hypoxia ([place]) 12/26/2017  . Orthostatic hypotension 12/26/2017  . Hypokalemia 04/11/2015  . Near syncope 04/11/2015  . Abdominal pain 04/11/2015  . Hyponatremia 03/22/2015  . Loss of weight 03/22/2015  . COPD exacerbation ([place]) 03/12/2015  . Type 2 diabetes mellitus ([place]) 03/08/2015  . Essential hypertension 03/08/2015    Orientation RESPIRATION BLADDER Height & Weight     Self  O2 Continent Weight: 147 lb (66.7 kg) Height:  5\' 2"  (157.5 cm)  BEHAVIORAL SYMPTOMS/MOOD NEUROLOGICAL BOWEL NUTRITION STATUS      Continent Diet(see dc summary)  AMBULATORY STATUS COMMUNICATION OF NEEDS Skin   Extensive Assist Verbally Normal                       Personal Care Assistance Level of Assistance  Bathing, Feeding, Dressing Bathing Assistance: Limited assistance Feeding assistance: Independent Dressing Assistance: Limited assistance     Functional Limitations Info  Sight, Hearing, Speech Sight Info: Impaired Hearing Info: Impaired Speech Info: Adequate    SPECIAL CARE FACTORS FREQUENCY  PT (By licensed PT), OT (By licensed OT)     PT Frequency:  5x/week OT Frequency: 5x/week            Contractures Contractures Info: Not present    Additional Factors Info  Code Status, Allergies Code Status Info: Full  Allergies Info: Codeine           Current Medications (01/02/2018):  This is the current hospital active medication list Current Facility-Administered Medications  Medication Dose Route Frequency Provider Last Rate Last Dose  . albuterol (PROVENTIL) (2.5 MG/3ML) 0.083% nebulizer solution 2.5 mg  2.5 mg Nebulization Q2H PRN Phillips Grout, MD   2.5 mg at 12/26/17 1505  . alum & mag hydroxide-simeth (MAALOX/MYLANTA) 200-200-20 MG/5ML suspension 15 mL  15 mL Oral Q4H PRN Arrien, Jimmy Picket, MD   15 mL at 12/31/17 1244  . aspirin chewable tablet 81 mg  81 mg Oral Daily Derrill Kay A, MD   81 mg at 01/02/18 0843  . atenolol (TENORMIN) tablet 50 mg  50 mg Oral Daily Oswald Hillock, MD   50 mg at 01/02/18 0844  . cholecalciferol (VITAMIN D) tablet 400 Units  400 Units Oral Daily Phillips Grout, MD   400 Units at 01/02/18 0848  . cholestyramine (QUESTRAN) packet 4 g  4 g Oral 2 times per day Phillips Grout, MD   4 g at 01/01/18 2228  . enoxaparin (LOVENOX) injection 40 mg  40 mg Subcutaneous Q24H Oswald Hillock, MD   40 mg at 01/01/18 1554  . fluticasone (FLONASE) 50 MCG/ACT nasal spray 1 spray  1 spray Each Nare  Daily PRN Phillips Grout, MD      . fluticasone furoate-vilanterol (BREO ELLIPTA) 200-25 MCG/INH 1 puff  1 puff Inhalation Daily Arrien, Jimmy Picket, MD   1 puff at 01/02/18 0816  . hydrALAZINE (APRESOLINE) tablet 25 mg  25 mg Oral Q6H PRN Oswald Hillock, MD   25 mg at 12/31/17 2123  . insulin aspart (novoLOG) injection 0-15 Units  0-15 Units Subcutaneous TID WC Oswald Hillock, MD   5 Units at 01/01/18 1230  . insulin aspart protamine- aspart (NOVOLOG MIX 70/30) injection 10 Units  10 Units Subcutaneous BID WC Arrien, Jimmy Picket, MD   10 Units at 01/02/18 [phone]  . ipratropium-albuterol (DUONEB) 0.5-2.5 (3)  MG/3ML nebulizer solution 3 mL  3 mL Nebulization TID Oswald Hillock, MD   3 mL at 01/02/18 0813  . loratadine (CLARITIN) tablet 10 mg  10 mg Oral Daily Derrill Kay A, MD   10 mg at 01/02/18 0844  . ondansetron (ZOFRAN) injection 4 mg  4 mg Intravenous Q6H PRN Bodenheimer, Charles A, NP      . polyethylene glycol (MIRALAX / GLYCOLAX) packet 17 g  17 g Oral Daily PRN Oswald Hillock, MD   17 g at 12/31/17 0759  . pravastatin (PRAVACHOL) tablet 40 mg  40 mg Oral Daily Phillips Grout, MD   40 mg at 01/02/18 2774     Discharge Medications: Please see discharge summary for a list of discharge medications.  Relevant Imaging Results:  Relevant Lab Results:   Additional Information ssn:   Servando Snare, LCSW

## 2018-03-14 ENCOUNTER — Emergency Department (HOSPITAL_COMMUNITY): Payer: Medicare Other

## 2018-03-14 ENCOUNTER — Encounter (HOSPITAL_COMMUNITY): Payer: Self-pay | Admitting: Emergency Medicine

## 2018-03-14 ENCOUNTER — Other Ambulatory Visit: Payer: Self-pay

## 2018-03-14 ENCOUNTER — Inpatient Hospital Stay (HOSPITAL_COMMUNITY)
Admission: EM | Admit: 2018-03-14 | Discharge: 2018-03-17 | DRG: 470 | Disposition: A | Discharge status: Skilled Nursing Facility | Payer: Medicare Other | Attending: Internal Medicine | Admitting: Internal Medicine

## 2018-03-14 DIAGNOSIS — S72002A Fracture of unspecified part of neck of left femur, initial encounter for closed fracture: Principal | ICD-10-CM | POA: Diagnosis present

## 2018-03-14 DIAGNOSIS — I10 Essential (primary) hypertension: Secondary | ICD-10-CM | POA: Diagnosis present

## 2018-03-14 DIAGNOSIS — Z79899 Other long term (current) drug therapy: Secondary | ICD-10-CM

## 2018-03-14 DIAGNOSIS — E559 Vitamin D deficiency, unspecified: Secondary | ICD-10-CM | POA: Diagnosis not present

## 2018-03-14 DIAGNOSIS — E119 Type 2 diabetes mellitus without complications: Secondary | ICD-10-CM

## 2018-03-14 DIAGNOSIS — D62 Acute posthemorrhagic anemia: Secondary | ICD-10-CM | POA: Diagnosis not present

## 2018-03-14 DIAGNOSIS — Z9289 Personal history of other medical treatment: Secondary | ICD-10-CM

## 2018-03-14 DIAGNOSIS — E785 Hyperlipidemia, unspecified: Secondary | ICD-10-CM | POA: Diagnosis not present

## 2018-03-14 DIAGNOSIS — E7800 Pure hypercholesterolemia, unspecified: Secondary | ICD-10-CM | POA: Diagnosis present

## 2018-03-14 DIAGNOSIS — D72829 Elevated white blood cell count, unspecified: Secondary | ICD-10-CM

## 2018-03-14 DIAGNOSIS — E669 Obesity, unspecified: Secondary | ICD-10-CM | POA: Diagnosis present

## 2018-03-14 DIAGNOSIS — Z7951 Long term (current) use of inhaled steroids: Secondary | ICD-10-CM | POA: Diagnosis not present

## 2018-03-14 DIAGNOSIS — Z89431 Acquired absence of right foot: Secondary | ICD-10-CM | POA: Diagnosis not present

## 2018-03-14 DIAGNOSIS — J449 Chronic obstructive pulmonary disease, unspecified: Secondary | ICD-10-CM | POA: Diagnosis present

## 2018-03-14 DIAGNOSIS — S72009A Fracture of unspecified part of neck of unspecified femur, initial encounter for closed fracture: Secondary | ICD-10-CM | POA: Diagnosis present

## 2018-03-14 DIAGNOSIS — Z6826 Body mass index (BMI) 26.0-26.9, adult: Secondary | ICD-10-CM | POA: Diagnosis not present

## 2018-03-14 DIAGNOSIS — I2781 Cor pulmonale (chronic): Secondary | ICD-10-CM | POA: Diagnosis present

## 2018-03-14 DIAGNOSIS — Z7982 Long term (current) use of aspirin: Secondary | ICD-10-CM | POA: Diagnosis not present

## 2018-03-14 DIAGNOSIS — E871 Hypo-osmolality and hyponatremia: Secondary | ICD-10-CM | POA: Diagnosis present

## 2018-03-14 DIAGNOSIS — Z794 Long term (current) use of insulin: Secondary | ICD-10-CM

## 2018-03-14 DIAGNOSIS — I951 Orthostatic hypotension: Secondary | ICD-10-CM | POA: Diagnosis present

## 2018-03-14 DIAGNOSIS — I110 Hypertensive heart disease with heart failure: Secondary | ICD-10-CM | POA: Diagnosis present

## 2018-03-14 DIAGNOSIS — I5032 Chronic diastolic (congestive) heart failure: Secondary | ICD-10-CM | POA: Diagnosis present

## 2018-03-14 DIAGNOSIS — W010XXA Fall on same level from slipping, tripping and stumbling without subsequent striking against object, initial encounter: Secondary | ICD-10-CM | POA: Diagnosis present

## 2018-03-14 DIAGNOSIS — S72042A Displaced fracture of base of neck of left femur, initial encounter for closed fracture: Secondary | ICD-10-CM | POA: Diagnosis not present

## 2018-03-14 DIAGNOSIS — J441 Chronic obstructive pulmonary disease with (acute) exacerbation: Secondary | ICD-10-CM | POA: Diagnosis not present

## 2018-03-14 DIAGNOSIS — S72001A Fracture of unspecified part of neck of right femur, initial encounter for closed fracture: Secondary | ICD-10-CM

## 2018-03-14 DIAGNOSIS — C9000 Multiple myeloma not having achieved remission: Secondary | ICD-10-CM | POA: Diagnosis present

## 2018-03-14 DIAGNOSIS — Z8673 Personal history of transient ischemic attack (TIA), and cerebral infarction without residual deficits: Secondary | ICD-10-CM | POA: Diagnosis not present

## 2018-03-14 DIAGNOSIS — F17210 Nicotine dependence, cigarettes, uncomplicated: Secondary | ICD-10-CM | POA: Diagnosis present

## 2018-03-14 DIAGNOSIS — Z9889 Other specified postprocedural states: Secondary | ICD-10-CM

## 2018-03-14 DIAGNOSIS — E1165 Type 2 diabetes mellitus with hyperglycemia: Secondary | ICD-10-CM | POA: Diagnosis not present

## 2018-03-14 LAB — CBC WITH DIFFERENTIAL/PLATELET
BASOS ABS: 0 10*3/uL (ref 0.0–0.1)
BASOS PCT: 0 %
EOS ABS: 0.3 10*3/uL (ref 0.0–0.7)
Eosinophils Relative: 2 %
HCT: 38.5 % (ref 36.0–46.0)
HEMOGLOBIN: 13.1 g/dL (ref 12.0–15.0)
Lymphocytes Relative: 17 %
Lymphs Abs: 2 10*3/uL (ref 0.7–4.0)
MCH: 28.7 pg (ref 26.0–34.0)
MCHC: 34 g/dL (ref 30.0–36.0)
MCV: 84.2 fL (ref 78.0–100.0)
Monocytes Absolute: 0.5 10*3/uL (ref 0.1–1.0)
Monocytes Relative: 4 %
Neutro Abs: 8.9 10*3/uL — ABNORMAL HIGH (ref 1.7–7.7)
Neutrophils Relative %: 77 %
Platelets: 270 10*3/uL (ref 150–400)
RBC: 4.57 MIL/uL (ref 3.87–5.11)
RDW: 14.3 % (ref 11.5–15.5)
WBC: 11.7 10*3/uL — AB (ref 4.0–10.5)

## 2018-03-14 LAB — BASIC METABOLIC PANEL
Anion gap: 15 (ref 5–15)
BUN: 13 mg/dL (ref 6–20)
CALCIUM: 9.8 mg/dL (ref 8.9–10.3)
CO2: 20 mmol/L — ABNORMAL LOW (ref 22–32)
CREATININE: 0.98 mg/dL (ref 0.44–1.00)
Chloride: 101 mmol/L (ref 101–111)
GFR calc Af Amer: >60 mL/min (ref >60)
GFR, EST NON AFRICAN AMERICAN: 54 mL/min — AB (ref >60)
Glucose, Bld: 266 mg/dL — ABNORMAL HIGH (ref 65–99)
Potassium: 3.5 mmol/L (ref 3.5–5.1)
SODIUM: 136 mmol/L (ref 135–145)

## 2018-03-14 LAB — GLUCOSE, CAPILLARY
Glucose-Capillary: 304 mg/dL — ABNORMAL HIGH (ref 65–99)
Glucose-Capillary: 323 mg/dL — ABNORMAL HIGH (ref 65–99)
Glucose-Capillary: 248 mg/dL — ABNORMAL HIGH (ref 65–99)

## 2018-03-14 LAB — TYPE AND SCREEN
ABO/RH(D): O POS
Antibody Screen: NEGATIVE

## 2018-03-14 LAB — PROTIME-INR
INR: 0.85
PROTHROMBIN TIME: 11.5 s (ref 11.4–15.2)

## 2018-03-14 MED ORDER — INSULIN ASPART 100 UNIT/ML ~~LOC~~ SOLN
3.0000 [IU] | Freq: Three times a day (TID) | SUBCUTANEOUS | Status: DC
Start: 2018-03-14 — End: 2018-03-17
  Administered 2018-03-15 – 2018-03-17 (×5): 3 [IU] via SUBCUTANEOUS

## 2018-03-14 MED ORDER — FENTANYL CITRATE (PF) 100 MCG/2ML IJ SOLN
50.0000 ug | INTRAMUSCULAR | Status: AC | PRN
  Administered 2018-03-14 (×2): 50 ug via INTRAVENOUS
  Filled 2018-03-14 (×2): qty 2

## 2018-03-14 MED ORDER — ATENOLOL 50 MG PO TABS
50.0000 mg | ORAL_TABLET | Freq: Every day | ORAL | Status: DC
  Administered 2018-03-14 – 2018-03-17 (×4): 50 mg via ORAL
  Filled 2018-03-14 (×4): qty 1

## 2018-03-14 MED ORDER — ONDANSETRON HCL 4 MG/2ML IJ SOLN
4.0000 mg | Freq: Once | INTRAMUSCULAR | Status: AC
  Administered 2018-03-14: 4 mg via INTRAVENOUS
  Filled 2018-03-14: qty 2

## 2018-03-14 MED ORDER — ALBUTEROL SULFATE (2.5 MG/3ML) 0.083% IN NEBU
2.5000 mg | INHALATION_SOLUTION | Freq: Four times a day (QID) | RESPIRATORY_TRACT | Status: DC | PRN
Start: 2018-03-14 — End: 2018-03-17

## 2018-03-14 MED ORDER — MORPHINE SULFATE (PF) 2 MG/ML IV SOLN: 0.5000 mg | INTRAVENOUS | Status: DC | PRN

## 2018-03-14 MED ORDER — METHOCARBAMOL 500 MG PO TABS
500.0000 mg | ORAL_TABLET | Freq: Four times a day (QID) | ORAL | Status: DC | PRN
  Administered 2018-03-15 – 2018-03-16 (×2): 500 mg via ORAL
  Filled 2018-03-14 (×2): qty 1

## 2018-03-14 MED ORDER — INSULIN ASPART 100 UNIT/ML ~~LOC~~ SOLN
0.0000 [IU] | Freq: Three times a day (TID) | SUBCUTANEOUS | Status: DC
  Administered 2018-03-14 (×2): 7 [IU] via SUBCUTANEOUS
  Administered 2018-03-15: 3 [IU] via SUBCUTANEOUS
  Administered 2018-03-15: 2 [IU] via SUBCUTANEOUS
  Administered 2018-03-16 (×2): 7 [IU] via SUBCUTANEOUS
  Administered 2018-03-16: 3 [IU] via SUBCUTANEOUS
  Administered 2018-03-17 (×2): 5 [IU] via SUBCUTANEOUS

## 2018-03-14 MED ORDER — METHOCARBAMOL 1000 MG/10ML IJ SOLN
500.0000 mg | Freq: Four times a day (QID) | INTRAVENOUS | Status: DC | PRN
  Administered 2018-03-14: 500 mg via INTRAVENOUS
  Filled 2018-03-14: qty 550

## 2018-03-14 MED ORDER — TIOTROPIUM BROMIDE MONOHYDRATE 18 MCG IN CAPS
18.0000 ug | ORAL_CAPSULE | Freq: Every day | RESPIRATORY_TRACT | Status: DC
  Administered 2018-03-16 – 2018-03-17 (×2): 18 ug via RESPIRATORY_TRACT
  Filled 2018-03-14: qty 5

## 2018-03-14 MED ORDER — POLYETHYLENE GLYCOL 3350 17 G PO PACK: 17.0000 g | PACK | Freq: Every day | ORAL | Status: DC | PRN

## 2018-03-14 MED ORDER — HYDROCODONE-ACETAMINOPHEN 5-325 MG PO TABS
1.0000 | ORAL_TABLET | Freq: Four times a day (QID) | ORAL | Status: DC | PRN
  Administered 2018-03-14 – 2018-03-17 (×7): 1 via ORAL
  Filled 2018-03-14 (×4): qty 1
  Filled 2018-03-14: qty 2
  Filled 2018-03-14 (×3): qty 1

## 2018-03-14 MED ORDER — ENOXAPARIN SODIUM 30 MG/0.3ML ~~LOC~~ SOLN
30.0000 mg | SUBCUTANEOUS | Status: DC
  Administered 2018-03-14: 30 mg via SUBCUTANEOUS
  Filled 2018-03-14 (×2): qty 0.3

## 2018-03-14 MED ORDER — BISACODYL 10 MG RE SUPP: 10.0000 mg | Freq: Every day | RECTAL | Status: DC | PRN

## 2018-03-14 MED ORDER — LACTATED RINGERS IV SOLN
INTRAVENOUS | Status: DC
  Administered 2018-03-15 – 2018-03-16 (×3): via INTRAVENOUS

## 2018-03-14 MED ORDER — LOSARTAN POTASSIUM 25 MG PO TABS
25.0000 mg | ORAL_TABLET | Freq: Every day | ORAL | Status: DC
  Administered 2018-03-14 – 2018-03-17 (×4): 25 mg via ORAL
  Filled 2018-03-14 (×4): qty 1

## 2018-03-14 NOTE — ED Notes (Signed)
Report given to Kim, RN.

## 2018-03-14 NOTE — H&P (Addendum)
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 Also known to have cor pulmonale based on echocardiogram at that same PA peak was only minimally elevated 24 DM TY 2 70/30 insulin HTN Orthostatic hypotension--Prior hyponatremia on chlorthalidone on admission '16  Came to the hospital secondary to severe pain in right hip-was walking around trying to get something from top of the fan and accidentally tripped and fell-no prodrome, no syncope, no blurred vision, no double vision prior to the same No evidence to suggest seizure activity See below discussion    ED Course: Patient given morphine/fentanyl placed on oxygen and orthopedics consulted-Dr. Lorin Mercy by emergency room Dr. Lorin Mercy unavailable to do surgery 5/11 potentially 5/12 versus 5/13-  Review of Systems:  Negative for fever, visual changes, sore throat, rash, new muscle aches, chest pain, SOB, dysuria, bleeding, n/v/abdominal pain.  Past Medical History:  Diagnosis Date  . Arthritis   . Blood transfusion without reported diagnosis   . COPD (chronic obstructive pulmonary disease) ([place])   . Diabetes mellitus without complication ([place])   . Hypertension   . Stroke [facility])     Past Surgical History:  Procedure Laterality Date  . amputation of foot    . CHOLECYSTECTOMY       reports that she has been smoking cigarettes.  She has a 60.00 pack-year smoking history. She has never used smokeless tobacco. She reports that she does not drink alcohol or use drugs. Mobility: Dependent at baseline Originally born in [place] moved here in [place]/06/1960 husband was ex-military-husband died in 02-06-79 currently lives with daughter and Marciano Sequin a country home in [place] smokes three-quarter pack per day, prior smoker 1 pack/day Independent at baseline all ADLs Had a fall last year onto opposite hip-did not fracture that however-circumstances at that time are accidental  Allergies  Allergen Reactions  . Codeine     Makes patient depressed.    Family History  Problem Relation Age of Onset  . Stroke Mother   . Cancer Father      Prior to Admission medications   Medication Sig Start Date End Date Taking? Authorizing Provider  albuterol (PROVENTIL) (2.5 MG/3ML) 0.083% nebulizer solution Take 3 mLs (2.5 mg total) by nebulization every 6 (six) hours as needed for wheezing or shortness of breath. 01/02/18  Yes Arrien, Jimmy Picket, MD  aspirin 81 MG chewable tablet Chew 81 mg by mouth at bedtime.  05/18/17 05/18/18 Yes [provider]  atenolol (TENORMIN) 50 MG tablet Take 1 tablet (50 mg total) by mouth daily. 04/11/15  Yes Moding, Langley Gauss, MD  insulin aspart protamine- aspart (NOVOLOG MIX 70/30) (70-30) 100 UNIT/ML injection Inject 0.1 mLs (10 Units total) into the skin 2 (two) times daily with a meal. Patient taking differently: Inject 2 Units into the skin 2 (two) times daily with a meal. Per sliding scale 03/14/15  Yes Corky Sox, MD  loratadine (CLARITIN) 10 MG tablet Take 10 mg by mouth daily after breakfast.    Yes [provider]  omeprazole (PRILOSEC) 20 MG capsule Take 20 mg by mouth daily after breakfast.    Yes [provider]  polyethylene glycol (MIRALAX / GLYCOLAX) packet Take 17 g by mouth daily as needed for moderate constipation. 01/02/18  Yes Arrien,  Jimmy Picket, MD  pravastatin (PRAVACHOL) 40 MG tablet Take 40 mg by mouth daily after breakfast.  12/16/17  Yes [provider]  vitamin D, CHOLECALCIFEROL, 400 UNITS tablet Take 400 Units by mouth daily.   Yes [provider]  cholestyramine Lucrezia Starch) 4 G packet Take 1 packet (4 g total) by mouth 2 (two) times daily. Patient not  taking: Reported on 03/14/2018 04/11/15   Moding, Langley Gauss, MD  fluticasone [facility]) 50 MCG/ACT nasal spray Place 1 spray into both nostrils daily. Patient not taking: Reported on 03/14/2018 04/11/15   Moding, Langley Gauss, MD  losartan (COZAAR) 25 MG tablet Take 1 tablet (25 mg total) by mouth daily. Patient not taking: Reported on 12/26/2017 04/11/15   Moding, Langley Gauss, MD  tiotropium (SPIRIVA) 18 MCG inhalation capsule Place 1 capsule (18 mcg total) into inhaler and inhale daily. Patient not taking: Reported on 12/26/2017 03/14/15   McLean-Scocuzza, Nino Glow, MD    Physical Exam:  Vitals:   03/14/18 0500 03/14/18 0530  BP: (!) 159/65 (!) 150/56  Pulse: (!) 57 (!) 59  Resp: 16 13  Temp:    SpO2: 97% 96%     EOMI frail looking older than stated age arcus senilis poor dentition Mallampati 3  No JVD no bruit  S1-S2 sinus rhythm-EKG shows PR 0.06 QRS axis 45 degrees no ST-T wave changes no suspicion for ischemia on this EKG  Chest clinically clear no wheeze no rales no rhonchi anterolaterally-posterior lung fields not examined secondary to habitus and pain  Abdomen soft nontender nondistended no rebound no guarding  Right leg externally rotated shortened and painful-on the forefoot 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 CT--neck/head CXR 1 view to clear for surgery is pending at this time please follow-up   Medical tests:   EKG independently reviewed: See above discussion-  Test discussed with performing physician:  Discussed with Dr. Kathrynn Humble of the emergency room  Decision to obtain old records:    Obtained  Review and summation of old records:   Extensively reviewed >10 minutes  Active Problems:   * No active hospital problems. *   Assessment/Plan Right hip fracture-placed in Buck's traction 3 pounds until orthopedics can see-does not appear to be going for emergent surgery placed on carb mod heart healthy diet-would control pain with oxycodone first choice morphine second choice hold fentanyl-IV saline to start midnight LR 50 cc/H-expect patient will need placement have asked therapy to see patient postoperatively  Cor pulmonale, diastolic heart failure-probably secondary to smoking-OHSS habitus-would continue current medications-losartan 25 daily, atenolol 50 daily might need to consider diuretic if not controlled  Well-controlled diabetes mellitus takes only 2 units of 70/30 insulin with meals-would not cover at this stage unless we see blood sugars above 140 perioperatively  Continued smoker three-quarter pack per day-nicotine patch 14 mg ordered-patient counseled for about 3 minutes with regards to healing and wound care as smoking well prevent this from happening-she understands to some degree We will continue currently tiotropium 18 mcg daily and albuterol as needed  Hyperlipidemia, hypercholesterolemia, vitamin D deficiency-all nonessential meds such as statin, cholecalciferol etc. etc. held-patient may benefit in fact from supratherapeutic  doses of cholecalciferol 50,000 units q. 7-day and I will defer this to orthopedics input  CVA 05/17/2017-needs to continue aspirin 81 mg presumably has no need for Plavix at this time per her neurologist   Severity of Illness: The appropriate patient status for this patient is INPATIENT. Inpatient status is judged to be reasonable and necessary in order to provide the required intensity of service to ensure the patient's safety. The patient's presenting symptoms, physical exam findings, and initial radiographic and laboratory data in  the context of their chronic comorbidities is felt to place them at high risk for further clinical deterioration. Furthermore, it is not anticipated that the patient will be medically stable for discharge from the hospital within 2 midnights of admission. The following factors support the patient status of inpatient.   " The patient's presenting symptoms include hip fracture. " The worrisome physical exam findings include externally rotated leg. " The initial radiographic and laboratory data are worrisome because of fracture of the left femoral neck, proximal migration femoral shaft and coxa vera deformity " The chronic co-morbidities include COPD, cor pulmonale, chronic diastolic heart failure etc. etc. as per my note.   * I certify that at the point of admission it is my clinical judgment that the patient will require inpatient hospital care spanning beyond 2 midnights from the point of admission due to high intensity of service, high risk for further deterioration and high frequency of surveillance required.*     DVT prophylaxis: Lovenox at this time Code Status: Full Family Communication: No one at bedside Consults called: Dr. Lorin Mercy of orthopedics consulted by emergency room physician  Time spent: 88 minutes  Jaeshawn Silvio, MD  [facility] Direct contact: [phone] --[place] [place]  --[email or web address]; password [email or web address]  7PM-7AM contact night coverage as above  03/14/2018, 7:09 AM

## 2018-03-14 NOTE — ED Notes (Signed)
ED TO INPATIENT HANDOFF REPORT  Name/Age/Gender Stefanie Vincent 77 y.o. female  Code Status    Code Status Orders  (From admission, onward)        Start     Ordered   03/14/18 0741  Full code  Continuous     03/14/18 0744    Code Status History    Date Active Date Inactive Code Status Order ID Comments User Context   12/26/2017 1217 01/02/2018 1758 Full Code [id number]  Phillips Grout, MD ED   04/11/2015 0636 04/11/2015 1710 Full Code [id number]  Bethena Roys, MD ED   03/12/2015 1720 03/14/2015 2223 Full Code [id number]  Corky Sox, MD Inpatient      Home/SNF/Other Home  Chief Complaint No admission diagnoses are documented for this encounter.  Level of Care/Admitting Diagnosis ED Disposition    ED Disposition Condition [facility] Area: [place] [100102]  Level of Care: Telemetry [5]  Admit to tele based on following criteria: Eval of Syncope  Diagnosis: Hip fracture [facility]) [488891]  Admitting Physician: Nita Sells [phone]  Attending Physician: Nita Sells [phone]  Estimated length of stay: 3 - 4 days  Certification:: I certify this patient will need inpatient services for at least 2 midnights  PT Class (Do Not Modify): Inpatient [101]  PT Acc Code (Do Not Modify): Private [1]       Medical History Past Medical History:  Diagnosis Date  . Arthritis   . Blood transfusion without reported diagnosis   . COPD (chronic obstructive pulmonary disease) ([place])   . Diabetes mellitus without complication ([place])   . Hypertension   . Stroke [facility])     Allergies Allergies  Allergen Reactions  . Codeine     Makes patient depressed.    IV Location/Drains/Wounds Patient Lines/Drains/Airways Status   Active Line/Drains/Airways    Name:   Placement date:   Placement time:   Site:   Days:   Peripheral IV 03/14/18 Right;Anterior Wrist   03/14/18    0726    Wrist   less than 1          Labs/Imaging Results for  orders placed or performed during the hospital encounter of 03/14/18 (from the past 48 hour(s))  Basic metabolic panel     Status: Abnormal   Collection Time: 03/14/18  5:26 AM  Result Value Ref Range   Sodium 136 135 - 145 mmol/L   Potassium 3.5 3.5 - 5.1 mmol/L   Chloride 101 101 - 111 mmol/L   CO2 20 (L) 22 - 32 mmol/L   Glucose, Bld 266 (H) 65 - 99 mg/dL   BUN 13 6 - 20 mg/dL   Creatinine, Ser 0.98 0.44 - 1.00 mg/dL   Calcium 9.8 8.9 - 10.3 mg/dL   GFR calc non Af Amer 54 (L) >60 mL/min   GFR calc Af Amer >60 >60 mL/min    Comment: (NOTE) The eGFR has been calculated using the CKD EPI equation. This calculation has not been validated in all clinical situations. eGFR's persistently <60 mL/min signify possible Chronic Kidney Disease.    Anion gap 15 5 - 15    Comment: Performed at [facility], [place] [address], [place], [place] [address]  CBC WITH DIFFERENTIAL     Status: Abnormal   Collection Time: 03/14/18  5:26 AM  Result Value Ref Range   WBC 11.7 (H) 4.0 - 10.5 K/uL   RBC 4.57 3.87 - 5.11 MIL/uL  Hemoglobin 13.1 12.0 - 15.0 g/dL   HCT 38.5 36.0 - 46.0 %   MCV 84.2 78.0 - 100.0 fL   MCH 28.7 26.0 - 34.0 pg   MCHC 34.0 30.0 - 36.0 g/dL   RDW 14.3 11.5 - 15.5 %   Platelets 270 150 - 400 K/uL   Neutrophils Relative % 77 %   Neutro Abs 8.9 (H) 1.7 - 7.7 K/uL   Lymphocytes Relative 17 %   Lymphs Abs 2.0 0.7 - 4.0 K/uL   Monocytes Relative 4 %   Monocytes Absolute 0.5 0.1 - 1.0 K/uL   Eosinophils Relative 2 %   Eosinophils Absolute 0.3 0.0 - 0.7 K/uL   Basophils Relative 0 %   Basophils Absolute 0.0 0.0 - 0.1 K/uL    Comment: Performed at [facility], [place] [address], [place], [place] [address]  Protime-INR     Status: None   Collection Time: 03/14/18  5:26 AM  Result Value Ref Range   Prothrombin Time 11.5 11.4 - 15.2 seconds   INR 0.85     Comment: Performed at [facility], [place] [address] [place],  [place] [address]  Type and screen [place]     Status: None   Collection Time: 03/14/18  5:35 AM  Result Value Ref Range   ABO/RH(D) O POS    Antibody Screen NEG    Sample Expiration      03/17/2018 Performed at [facility], [place] [address] [place], [place] [address]    Ct Head Wo Contrast  Result Date: 03/14/2018 CLINICAL DATA:  77 year old female with fall. EXAM: CT HEAD WITHOUT CONTRAST CT CERVICAL SPINE WITHOUT CONTRAST TECHNIQUE: Multidetector CT imaging of the head and cervical spine was performed following the standard protocol without intravenous contrast. Multiplanar CT image reconstructions of the cervical spine were also generated. COMPARISON:  None. FINDINGS: CT HEAD FINDINGS Brain: There is mild age-related atrophy and moderate chronic microvascular ischemic changes. There is no acute intracranial hemorrhage. No mass effect or midline shift. No extra-axial fluid collection. Vascular: No hyperdense vessel or unexpected calcification. Skull: Normal. Negative for fracture or focal lesion. Sinuses/Orbits: No acute finding. Other: None CT CERVICAL SPINE FINDINGS Alignment: No acute subluxation. Skull base and vertebrae: No acute fracture.  Osteopenia. Soft tissues and spinal canal: No prevertebral fluid or swelling. No visible canal hematoma. Disc levels: Multilevel degenerative changes most prominent at C3-C4 and C5-C6. Upper chest: Mild interstitial coarsening and interlobular septal prominence may represent a degree of interstitial edema. Clinical correlation is recommended. There is atherosclerotic calcification of the aortic arch. Bilateral carotid bulb calcified plaques noted. Other: None IMPRESSION: 1. No acute intracranial hemorrhage. Age-related atrophy and chronic microvascular ischemic changes. 2. No acute/traumatic cervical spine pathology. Osteopenia with multilevel degenerative changes. 3. Bilateral carotid bulb calcified plaques. Electronically  Signed   By: Anner Crete M.D.   On: 03/14/2018 06:46   Ct Cervical Spine Wo Contrast  Result Date: 03/14/2018 CLINICAL DATA:  77 year old female with fall. EXAM: CT HEAD WITHOUT CONTRAST CT CERVICAL SPINE WITHOUT CONTRAST TECHNIQUE: Multidetector CT imaging of the head and cervical spine was performed following the standard protocol without intravenous contrast. Multiplanar CT image reconstructions of the cervical spine were also generated. COMPARISON:  None. FINDINGS: CT HEAD FINDINGS Brain: There is mild age-related atrophy and moderate chronic microvascular ischemic changes. There is no acute intracranial hemorrhage. No mass effect or midline shift. No extra-axial fluid collection. Vascular: No hyperdense vessel or unexpected calcification.  Skull: Normal. Negative for fracture or focal lesion. Sinuses/Orbits: No acute finding. Other: None CT CERVICAL SPINE FINDINGS Alignment: No acute subluxation. Skull base and vertebrae: No acute fracture.  Osteopenia. Soft tissues and spinal canal: No prevertebral fluid or swelling. No visible canal hematoma. Disc levels: Multilevel degenerative changes most prominent at C3-C4 and C5-C6. Upper chest: Mild interstitial coarsening and interlobular septal prominence may represent a degree of interstitial edema. Clinical correlation is recommended. There is atherosclerotic calcification of the aortic arch. Bilateral carotid bulb calcified plaques noted. Other: None IMPRESSION: 1. No acute intracranial hemorrhage. Age-related atrophy and chronic microvascular ischemic changes. 2. No acute/traumatic cervical spine pathology. Osteopenia with multilevel degenerative changes. 3. Bilateral carotid bulb calcified plaques. Electronically Signed   By: Anner Crete M.D.   On: 03/14/2018 06:46   Dg Chest Port 1 View  Result Date: 03/14/2018 CLINICAL DATA:  Hip fracture.  Preoperative study. EXAM: PORTABLE CHEST 1 VIEW COMPARISON:  December 27, 2017 FINDINGS: The heart size  and mediastinal contours are within normal limits. Both lungs are clear. The visualized skeletal structures are unremarkable. IMPRESSION: No active disease. Electronically Signed   By: Dorise Bullion III M.D   On: 03/14/2018 08:20   Dg Hip Unilat With Pelvis 2-3 Views Left  Result Date: 03/14/2018 CLINICAL DATA:  77 y/o F; left buttocks and hip pain. Unable to bear weight on the left hip. EXAM: DG HIP (WITH OR WITHOUT PELVIS) 2-3V LEFT COMPARISON:  None. FINDINGS: Acute fracture of the left femoral neck with proximal migration of the femoral shaft and coxa vara deformity. No pelvic fracture or diastasis. Vascular calcifications noted. IMPRESSION: Acute fracture of left femoral neck with proximal migration of femoral shaft and coxa vara deformity. Electronically Signed   By: Kristine Garbe M.D.   On: 03/14/2018 06:38    Pending Labs Unresulted Labs (From admission, onward)   Start     Ordered   03/21/18 0500  Creatinine, serum  (enoxaparin (LOVENOX)    CrCl < 30 ml/min)  Weekly,   R    Comments:  while on enoxaparin therapy.    03/14/18 0744   03/15/18 0500  CBC  Tomorrow morning,   R     03/14/18 0744   03/15/18 0300  Basic metabolic panel  Tomorrow morning,   R     03/14/18 0744   03/14/18 0534  ABO/Rh  Once,   R     03/14/18 0534      Vitals/Pain Today's Vitals   03/14/18 0824 03/14/18 0834 03/14/18 0838 03/14/18 0930  BP:  (!) 151/56  (!) 166/60  Pulse: 60 62  69  Resp: _0 Temp:      TempSrc:      SpO2: 98% 96%  93%  Weight:      Height:      PainSc:   Asleep     Isolation Precautions No active isolations  Medications Medications  HYDROcodone-acetaminophen (NORCO/VICODIN) 5-325 MG per tablet 1-2 tablet (has no administration in time range)  morphine 2 MG/ML injection 0.5 mg (has no administration in time range)  enoxaparin (LOVENOX) injection 30 mg (has no administration in time range)  methocarbamol (ROBAXIN) tablet 500 mg (has no administration in  time range)    Or  methocarbamol (ROBAXIN) 500 mg in dextrose 5 % 50 mL IVPB (has no administration in time range)  bisacodyl (DULCOLAX) suppository 10 mg (has no administration in time range)  tiotropium (SPIRIVA) inhalation capsule 18 mcg (has no administration  in time range)  losartan (COZAAR) tablet 25 mg (has no administration in time range)  albuterol (PROVENTIL) (2.5 MG/3ML) 0.083% nebulizer solution 2.5 mg (has no administration in time range)  atenolol (TENORMIN) tablet 50 mg (has no administration in time range)  polyethylene glycol (MIRALAX / GLYCOLAX) packet 17 g (has no administration in time range)  lactated ringers infusion (has no administration in time range)  fentaNYL (SUBLIMAZE) injection 50 mcg (50 mcg Intravenous Given 03/14/18 0755)  ondansetron (ZOFRAN) injection 4 mg (4 mg Intravenous Given 03/14/18 0653)    Mobility non-ambulatory

## 2018-03-14 NOTE — ED Triage Notes (Signed)
Patient fell trying to turn of he lights at home. She complains of 10/10 left buttock/ hip pain. She is unable to weight bare on the left hip. EMS reports the patient states she is not on blood thinners.

## 2018-03-14 NOTE — ED Notes (Signed)
Attempted to give report to Maudie Mercury, Therapist, sports. No answer. Spoke with Network engineer and will call back in 5 min.

## 2018-03-14 NOTE — ED Provider Notes (Signed)
[facility] DEPT Provider Note   CSN: [id number] Arrival date & time: 03/14/18  0321     History   Chief Complaint Chief Complaint  Patient presents with  . Fall    HPI Stefanie Vincent is a 77 y.o. female.  HPI  77 year old comes in with chief complaint of fall. Patient has history of diabetes, hypertension, stroke, COPD.  She reports that she slipped and fell in her room.  Patient fell onto her left side and is having severe left hip pain.  Patient denies any associated numbness, tingling.  She also denies any back pain, chest pain, neck pain.  Patient is having headache but is not severe and she has no associated vision changes or any neurologic deficits.  Patient is not on any blood thinners.  Past Medical History:  Diagnosis Date  . Arthritis   . Blood transfusion without reported diagnosis   . COPD (chronic obstructive pulmonary disease) ([place])   . Diabetes mellitus without complication ([place])   . Hypertension   . Stroke [facility])     Patient Active Problem List   Diagnosis Date Noted  . Hip fracture ([place]) 03/14/2018  . Influenza A 12/26/2017  . CAP (community acquired pneumonia) 12/26/2017  . Acute respiratory failure with hypoxia ([place]) 12/26/2017  . Orthostatic hypotension 12/26/2017  . Hypokalemia 04/11/2015  . Near syncope 04/11/2015  . Abdominal pain 04/11/2015  . Hyponatremia 03/22/2015  . Loss of weight 03/22/2015  . COPD exacerbation ([place]) 03/12/2015  . Type 2 diabetes mellitus ([place]) 03/08/2015  . Essential hypertension 03/08/2015    Past Surgical History:  Procedure Laterality Date  . amputation of foot    . CHOLECYSTECTOMY       OB History   None      Home Medications    Prior to Admission medications   Medication Sig Start Date End Date Taking? Authorizing Provider  albuterol (PROVENTIL) (2.5 MG/3ML) 0.083% nebulizer solution Take 3 mLs (2.5 mg total) by nebulization every 6 (six) hours as needed for wheezing  or shortness of breath. 01/02/18  Yes Arrien, Jimmy Picket, MD  aspirin 81 MG chewable tablet Chew 81 mg by mouth at bedtime.  05/18/17 05/18/18 Yes [provider]  atenolol (TENORMIN) 50 MG tablet Take 1 tablet (50 mg total) by mouth daily. 04/11/15  Yes Moding, Langley Gauss, MD  insulin aspart protamine- aspart (NOVOLOG MIX 70/30) (70-30) 100 UNIT/ML injection Inject 0.1 mLs (10 Units total) into the skin 2 (two) times daily with a meal. Patient taking differently: Inject 2 Units into the skin 2 (two) times daily with a meal. Per sliding scale 03/14/15  Yes Corky Sox, MD  loratadine (CLARITIN) 10 MG tablet Take 10 mg by mouth daily after breakfast.    Yes [provider]  omeprazole (PRILOSEC) 20 MG capsule Take 20 mg by mouth daily after breakfast.    Yes [provider]  polyethylene glycol (MIRALAX / GLYCOLAX) packet Take 17 g by mouth daily as needed for moderate constipation. 01/02/18  Yes Arrien, Jimmy Picket, MD  pravastatin (PRAVACHOL) 40 MG tablet Take 40 mg by mouth daily after breakfast.  12/16/17  Yes [provider]  vitamin D, CHOLECALCIFEROL, 400 UNITS tablet Take 400 Units by mouth daily.   Yes [provider]  cholestyramine Lucrezia Starch) 4 G packet Take 1 packet (4 g total) by mouth 2 (two) times daily. Patient not taking: Reported on 03/14/2018 04/11/15   Moding, Langley Gauss, MD  fluticasone [facility]) 50  MCG/ACT nasal spray Place 1 spray into both nostrils daily. Patient not taking: Reported on 03/14/2018 04/11/15   Moding, Langley Gauss, MD  losartan (COZAAR) 25 MG tablet Take 1 tablet (25 mg total) by mouth daily. Patient not taking: Reported on 12/26/2017 04/11/15   Moding, Langley Gauss, MD  tiotropium (SPIRIVA) 18 MCG inhalation capsule Place 1 capsule (18 mcg total) into inhaler and inhale daily. Patient not taking: Reported on 12/26/2017 03/14/15   McLean-Scocuzza, Nino Glow, MD    Family History Family History  Problem Relation Age of Onset  .  Stroke Mother   . Cancer Father     Social History Social History   Tobacco Use  . Smoking status: Current Every Day Smoker    Packs/day: 1.00    Years: 60.00    Pack years: 60.00    Types: Cigarettes  . Smokeless tobacco: Never Used  . Tobacco comment: Has cut back lately.  Substance Use Topics  . Alcohol use: No    Alcohol/week: 0.0 oz  . Drug use: No     Allergies   Codeine   Review of Systems Review of Systems  Constitutional: Positive for activity change.  Musculoskeletal: Positive for arthralgias and back pain.  Allergic/Immunologic: Negative for immunocompromised state.  Hematological: Does not bruise/bleed easily.  All other systems reviewed and are negative.    Physical Exam Updated Vital Signs BP (!) 154/59   Pulse 63   Temp 97.6 F (36.4 C) (Oral)   Resp 13   Ht 5\' 2"  (1.575 m)   Wt 66.7 kg (147 lb)   SpO2 100%   BMI 26.89 kg/m   Physical Exam  Constitutional: She is oriented to person, place, and time. She appears well-developed.  HENT:  Head: Normocephalic and atraumatic.  Eyes: Pupils are equal, round, and reactive to light. EOM are normal.  Neck: Normal range of motion. Neck supple.  Cardiovascular: Normal rate.  Pulmonary/Chest: Effort normal.  Abdominal: Bowel sounds are normal.  Musculoskeletal:  Patient has left-sided hip tenderness. OTHERWISE:  Head to toe evaluation shows no hematoma, bleeding of the scalp, no facial abrasions, no spine step offs, crepitus of the chest or neck, no tenderness to palpation of the bilateral upper and lower extremities, no gross deformities, no chest tenderness, no pelvic pain.   Neurological: She is alert and oriented to person, place, and time.  Skin: Skin is warm and dry.  Nursing note and vitals reviewed.    ED Treatments / Results  Labs (all labs ordered are listed, but only abnormal results are displayed) Labs Reviewed  BASIC METABOLIC PANEL - Abnormal; Notable for the following  components:      Result Value   CO2 20 (*)    Glucose, Bld 266 (*)    GFR calc non Af Amer 54 (*)    All other components within normal limits  CBC WITH DIFFERENTIAL/PLATELET - Abnormal; Notable for the following components:   WBC 11.7 (*)    Neutro Abs 8.9 (*)    All other components within normal limits  PROTIME-INR  TYPE AND SCREEN  ABO/RH    EKG EKG Interpretation  Date/Time:  Saturday Mar 14 2018 04:56:08 EDT Ventricular Rate:  61 PR Interval:    QRS Duration: 99 QT Interval:  445 QTC Calculation: 449 R Axis:   58 Text Interpretation:  Sinus rhythm Low voltage, extremity and precordial leads No acute changes Confirmed by Varney Biles [phone]) on 03/14/2018 4:59:56 AM   Radiology Ct Head Wo Contrast  Result Date: 03/14/2018 CLINICAL DATA:  77 year old female with fall. EXAM: CT HEAD WITHOUT CONTRAST CT CERVICAL SPINE WITHOUT CONTRAST TECHNIQUE: Multidetector CT imaging of the head and cervical spine was performed following the standard protocol without intravenous contrast. Multiplanar CT image reconstructions of the cervical spine were also generated. COMPARISON:  None. FINDINGS: CT HEAD FINDINGS Brain: There is mild age-related atrophy and moderate chronic microvascular ischemic changes. There is no acute intracranial hemorrhage. No mass effect or midline shift. No extra-axial fluid collection. Vascular: No hyperdense vessel or unexpected calcification. Skull: Normal. Negative for fracture or focal lesion. Sinuses/Orbits: No acute finding. Other: None CT CERVICAL SPINE FINDINGS Alignment: No acute subluxation. Skull base and vertebrae: No acute fracture.  Osteopenia. Soft tissues and spinal canal: No prevertebral fluid or swelling. No visible canal hematoma. Disc levels: Multilevel degenerative changes most prominent at C3-C4 and C5-C6. Upper chest: Mild interstitial coarsening and interlobular septal prominence may represent a degree of interstitial edema. Clinical correlation  is recommended. There is atherosclerotic calcification of the aortic arch. Bilateral carotid bulb calcified plaques noted. Other: None IMPRESSION: 1. No acute intracranial hemorrhage. Age-related atrophy and chronic microvascular ischemic changes. 2. No acute/traumatic cervical spine pathology. Osteopenia with multilevel degenerative changes. 3. Bilateral carotid bulb calcified plaques. Electronically Signed   By: Anner Crete M.D.   On: 03/14/2018 06:46   Ct Cervical Spine Wo Contrast  Result Date: 03/14/2018 CLINICAL DATA:  77 year old female with fall. EXAM: CT HEAD WITHOUT CONTRAST CT CERVICAL SPINE WITHOUT CONTRAST TECHNIQUE: Multidetector CT imaging of the head and cervical spine was performed following the standard protocol without intravenous contrast. Multiplanar CT image reconstructions of the cervical spine were also generated. COMPARISON:  None. FINDINGS: CT HEAD FINDINGS Brain: There is mild age-related atrophy and moderate chronic microvascular ischemic changes. There is no acute intracranial hemorrhage. No mass effect or midline shift. No extra-axial fluid collection. Vascular: No hyperdense vessel or unexpected calcification. Skull: Normal. Negative for fracture or focal lesion. Sinuses/Orbits: No acute finding. Other: None CT CERVICAL SPINE FINDINGS Alignment: No acute subluxation. Skull base and vertebrae: No acute fracture.  Osteopenia. Soft tissues and spinal canal: No prevertebral fluid or swelling. No visible canal hematoma. Disc levels: Multilevel degenerative changes most prominent at C3-C4 and C5-C6. Upper chest: Mild interstitial coarsening and interlobular septal prominence may represent a degree of interstitial edema. Clinical correlation is recommended. There is atherosclerotic calcification of the aortic arch. Bilateral carotid bulb calcified plaques noted. Other: None IMPRESSION: 1. No acute intracranial hemorrhage. Age-related atrophy and chronic microvascular ischemic  changes. 2. No acute/traumatic cervical spine pathology. Osteopenia with multilevel degenerative changes. 3. Bilateral carotid bulb calcified plaques. Electronically Signed   By: Anner Crete M.D.   On: 03/14/2018 06:46   Dg Hip Unilat With Pelvis 2-3 Views Left  Result Date: 03/14/2018 CLINICAL DATA:  77 y/o F; left buttocks and hip pain. Unable to bear weight on the left hip. EXAM: DG HIP (WITH OR WITHOUT PELVIS) 2-3V LEFT COMPARISON:  None. FINDINGS: Acute fracture of the left femoral neck with proximal migration of the femoral shaft and coxa vara deformity. No pelvic fracture or diastasis. Vascular calcifications noted. IMPRESSION: Acute fracture of left femoral neck with proximal migration of femoral shaft and coxa vara deformity. Electronically Signed   By: Kristine Garbe M.D.   On: 03/14/2018 06:38    Procedures Procedures (including critical care time)  Medications Ordered in ED Medications  HYDROcodone-acetaminophen (NORCO/VICODIN) 5-325 MG per tablet 1-2 tablet (has no administration in time range)  morphine 2 MG/ML injection 0.5 mg (has no administration in time range)  enoxaparin (LOVENOX) injection 30 mg (has no administration in time range)  methocarbamol (ROBAXIN) tablet 500 mg (has no administration in time range)    Or  methocarbamol (ROBAXIN) 500 mg in dextrose 5 % 50 mL IVPB (has no administration in time range)  bisacodyl (DULCOLAX) suppository 10 mg (has no administration in time range)  tiotropium (SPIRIVA) inhalation capsule 18 mcg (has no administration in time range)  losartan (COZAAR) tablet 25 mg (has no administration in time range)  albuterol (PROVENTIL) (2.5 MG/3ML) 0.083% nebulizer solution 2.5 mg (has no administration in time range)  atenolol (TENORMIN) tablet 50 mg (has no administration in time range)  polyethylene glycol (MIRALAX / GLYCOLAX) packet 17 g (has no administration in time range)  fentaNYL (SUBLIMAZE) injection 50 mcg (50 mcg  Intravenous Given 03/14/18 0755)  ondansetron (ZOFRAN) injection 4 mg (4 mg Intravenous Given 03/14/18 0653)     Initial Impression / Assessment and Plan / ED Course  I have reviewed the triage vital signs and the nursing notes.  Pertinent labs & imaging results that were available during my care of the patient were reviewed by me and considered in my medical decision making (see chart for details).     77 year old with history of COPD and cor pulmonale comes in after a mechanical fall.  Patient has a femoral neck fracture as a result of her fall.  Appropriate imaging ordered and looks reassuring.  Medicine to admit.  Dr. Lorin Mercy from orthopedic surgery to see the patient.  He will try to get the surgery done on Sunday, otherwise patient will be posted on Monday.  Final Clinical Impressions(s) / ED Diagnoses   Final diagnoses:  Closed fracture of neck of left femur, initial encounter [facility])    ED Discharge Orders    None       Varney Biles, MD 03/14/18 [phone]

## 2018-03-14 NOTE — ED Notes (Addendum)
Spoke with on call Ortho. Ortho states, "we only put patient on bucks traction when they are up on the floor not when they are in the ED. On call ortho does not come in for bucks traction".

## 2018-03-14 NOTE — ED Notes (Signed)
Paged pharmacy about patients unverified medication (Lovenox). Pharmacy will verify when patient gets to the floor.

## 2018-03-14 NOTE — Consult Note (Addendum)
Reason for Consult:fall with displaced left femoral neck fracture Referring Physician:J. Verlon Au MD  Stefanie Vincent is an 77 y.o. female.  HPI: 77 year old female who lives here in [place] with her daughter with recent discharge from the hospital February 2019 with influenza and hypoxia.,  Was at home turn twisted fell suffering a closed displaced femoral neck fracture.  Patient has type 2 diabetes on insulin, COPD on inhalers, hypertension, heart failure, previous stroke, multiple myeloma 2016, renal insufficiency 2016.  Patient is a continued smoker 1 pack/day x 60 years.  Patient had acute pain with fall unable to ambulate.  Pain occurred immediately and his moderate severity unless she moves and then she has severe pain.  No past history of hip problems she denies syncopal episode no loss of consciousness.  No other injuries from the fall other than the left closed displaced femoral neck fracture.  Past Medical History:  Diagnosis Date  . Arthritis   . Blood transfusion without reported diagnosis   . COPD (chronic obstructive pulmonary disease) ([place])   . Diabetes mellitus without complication ([place])   . Hypertension   . Stroke [facility])     Past Surgical History:  Procedure Laterality Date  . amputation of foot    . CHOLECYSTECTOMY      Family History  Problem Relation Age of Onset  . Stroke Mother   . Cancer Father     Social History:  reports that she has been smoking cigarettes.  She has a 60.00 pack-year smoking history. She has never used smokeless tobacco. She reports that she does not drink alcohol or use drugs.  Allergies:  Allergies  Allergen Reactions  . Codeine     Makes patient depressed.    Medications: I have reviewed the patient's current medications.  Results for orders placed or performed during the hospital encounter of 03/14/18 (from the past 48 hour(s))  Basic metabolic panel     Status: Abnormal   Collection Time: 03/14/18  5:26 AM  Result Value  Ref Range   Sodium 136 135 - 145 mmol/L   Potassium 3.5 3.5 - 5.1 mmol/L   Chloride 101 101 - 111 mmol/L   CO2 20 (L) 22 - 32 mmol/L   Glucose, Bld 266 (H) 65 - 99 mg/dL   BUN 13 6 - 20 mg/dL   Creatinine, Ser 0.98 0.44 - 1.00 mg/dL   Calcium 9.8 8.9 - 10.3 mg/dL   GFR calc non Af Amer 54 (L) >60 mL/min   GFR calc Af Amer >60 >60 mL/min    Comment: (NOTE) The eGFR has been calculated using the CKD EPI equation. This calculation has not been validated in all clinical situations. eGFR's persistently <60 mL/min signify possible Chronic Kidney Disease.    Anion gap 15 5 - 15    Comment: Performed at [facility], [place] [address] [place],  [address]  CBC WITH DIFFERENTIAL     Status: Abnormal   Collection Time: 03/14/18  5:26 AM  Result Value Ref Range   WBC 11.7 (H) 4.0 - 10.5 K/uL   RBC 4.57 3.87 - 5.11 MIL/uL   Hemoglobin 13.1 12.0 - 15.0 g/dL   HCT 38.5 36.0 - 46.0 %   MCV 84.2 78.0 - 100.0 fL   MCH 28.7 26.0 - 34.0 pg   MCHC 34.0 30.0 - 36.0 g/dL   RDW 14.3 11.5 - 15.5 %   Platelets 270 150 - 400 K/uL   Neutrophils Relative % 77 %  Neutro Abs 8.9 (H) 1.7 - 7.7 K/uL   Lymphocytes Relative 17 %   Lymphs Abs 2.0 0.7 - 4.0 K/uL   Monocytes Relative 4 %   Monocytes Absolute 0.5 0.1 - 1.0 K/uL   Eosinophils Relative 2 %   Eosinophils Absolute 0.3 0.0 - 0.7 K/uL   Basophils Relative 0 %   Basophils Absolute 0.0 0.0 - 0.1 K/uL    Comment: Performed at [facility], [place] [address], [place], [place] [address]  Protime-INR     Status: None   Collection Time: 03/14/18  5:26 AM  Result Value Ref Range   Prothrombin Time 11.5 11.4 - 15.2 seconds   INR 0.85     Comment: Performed at [facility], [place] [address], [place], [place] [address]  Type and screen [place]     Status: None   Collection Time: 03/14/18  5:35 AM  Result Value Ref Range   ABO/RH(D) O POS    Antibody Screen NEG    Sample  Expiration      03/17/2018 Performed at [facility], [place] [address], [place], [place] [address]   Glucose, capillary     Status: Abnormal   Collection Time: 03/14/18 12:27 PM  Result Value Ref Range   Glucose-Capillary 304 (H) 65 - 99 mg/dL    Ct Head Wo Contrast  Result Date: 03/14/2018 CLINICAL DATA:  77 year old female with fall. EXAM: CT HEAD WITHOUT CONTRAST CT CERVICAL SPINE WITHOUT CONTRAST TECHNIQUE: Multidetector CT imaging of the head and cervical spine was performed following the standard protocol without intravenous contrast. Multiplanar CT image reconstructions of the cervical spine were also generated. COMPARISON:  None. FINDINGS: CT HEAD FINDINGS Brain: There is mild age-related atrophy and moderate chronic microvascular ischemic changes. There is no acute intracranial hemorrhage. No mass effect or midline shift. No extra-axial fluid collection. Vascular: No hyperdense vessel or unexpected calcification. Skull: Normal. Negative for fracture or focal lesion. Sinuses/Orbits: No acute finding. Other: None CT CERVICAL SPINE FINDINGS Alignment: No acute subluxation. Skull base and vertebrae: No acute fracture.  Osteopenia. Soft tissues and spinal canal: No prevertebral fluid or swelling. No visible canal hematoma. Disc levels: Multilevel degenerative changes most prominent at C3-C4 and C5-C6. Upper chest: Mild interstitial coarsening and interlobular septal prominence may represent a degree of interstitial edema. Clinical correlation is recommended. There is atherosclerotic calcification of the aortic arch. Bilateral carotid bulb calcified plaques noted. Other: None IMPRESSION: 1. No acute intracranial hemorrhage. Age-related atrophy and chronic microvascular ischemic changes. 2. No acute/traumatic cervical spine pathology. Osteopenia with multilevel degenerative changes. 3. Bilateral carotid bulb calcified plaques. Electronically Signed   By: Anner Crete M.D.   On:  03/14/2018 06:46   Ct Cervical Spine Wo Contrast  Result Date: 03/14/2018 CLINICAL DATA:  77 year old female with fall. EXAM: CT HEAD WITHOUT CONTRAST CT CERVICAL SPINE WITHOUT CONTRAST TECHNIQUE: Multidetector CT imaging of the head and cervical spine was performed following the standard protocol without intravenous contrast. Multiplanar CT image reconstructions of the cervical spine were also generated. COMPARISON:  None. FINDINGS: CT HEAD FINDINGS Brain: There is mild age-related atrophy and moderate chronic microvascular ischemic changes. There is no acute intracranial hemorrhage. No mass effect or midline shift. No extra-axial fluid collection. Vascular: No hyperdense vessel or unexpected calcification. Skull: Normal. Negative for fracture or focal lesion. Sinuses/Orbits: No acute finding. Other: None CT CERVICAL SPINE FINDINGS Alignment: No acute subluxation. Skull base and vertebrae: No acute fracture.  Osteopenia. Soft  tissues and spinal canal: No prevertebral fluid or swelling. No visible canal hematoma. Disc levels: Multilevel degenerative changes most prominent at C3-C4 and C5-C6. Upper chest: Mild interstitial coarsening and interlobular septal prominence may represent a degree of interstitial edema. Clinical correlation is recommended. There is atherosclerotic calcification of the aortic arch. Bilateral carotid bulb calcified plaques noted. Other: None IMPRESSION: 1. No acute intracranial hemorrhage. Age-related atrophy and chronic microvascular ischemic changes. 2. No acute/traumatic cervical spine pathology. Osteopenia with multilevel degenerative changes. 3. Bilateral carotid bulb calcified plaques. Electronically Signed   By: Anner Crete M.D.   On: 03/14/2018 06:46   Dg Chest Port 1 View  Result Date: 03/14/2018 CLINICAL DATA:  Hip fracture.  Preoperative study. EXAM: PORTABLE CHEST 1 VIEW COMPARISON:  December 27, 2017 FINDINGS: The heart size and mediastinal contours are within normal  limits. Both lungs are clear. The visualized skeletal structures are unremarkable. IMPRESSION: No active disease. Electronically Signed   By: Dorise Bullion III M.D   On: 03/14/2018 08:20   Dg Hip Unilat With Pelvis 2-3 Views Left  Result Date: 03/14/2018 CLINICAL DATA:  77 y/o F; left buttocks and hip pain. Unable to bear weight on the left hip. EXAM: DG HIP (WITH OR WITHOUT PELVIS) 2-3V LEFT COMPARISON:  None. FINDINGS: Acute fracture of the left femoral neck with proximal migration of the femoral shaft and coxa vara deformity. No pelvic fracture or diastasis. Vascular calcifications noted. IMPRESSION: Acute fracture of left femoral neck with proximal migration of femoral shaft and coxa vara deformity. Electronically Signed   By: Kristine Garbe M.D.   On: 03/14/2018 06:38    ROS 14 point review of system positive for asthma, COPD, congestive heart failure, multiple myeloma 2016, renal insufficiency, type 2 diabetes on insulin, COPD still smoking history of influenza type A, community-acquired pneumonia history of acute respiratory failure with hypoxia.  Otherwise negative as it pertains HPI. Blood pressure (!) 175/60, pulse 68, temperature 97.8 F (36.6 C), temperature source Oral, resp. rate 16, height '5\' 2"'$  (1.575 m), weight 147 lb (66.7 kg), SpO2 95 %. Physical Exam  Constitutional: She is oriented to person, place, and time. She appears well-developed and well-nourished.  HENT:  Head: Normocephalic and atraumatic.  Eyes: Pupils are equal, round, and reactive to light. Conjunctivae are normal.  Neck: Normal range of motion. Neck supple. No tracheal deviation present. No thyromegaly present.  Cardiovascular: Normal rate and regular rhythm.  Respiratory: Effort normal. No respiratory distress.  GI: Soft. She exhibits no distension. There is no tenderness.  Musculoskeletal:  Left leg short externally rotated.  Extraction is currently being applied by orthopedic tech.  Distal pulses  are intact sciatic sensation is intact.  No pain with opposite right hip range of motion.  No swelling in the knees.  Skin over the hip is intact.  Neurological: She is alert and oriented to person, place, and time.  Skin: Skin is warm and dry.  Psychiatric: She has a normal mood and affect. Her behavior is normal. Judgment and thought content normal.    Assessment/Plan: Displaced left femoral neck fracture is a 78 year old female with multiple problems including history of heart failure COPD, previous CVA.  Plan left hip bipolar hemiarthroplasty.  Procedure discussed and answered she understands and requests we proceed.  She has some increased surgical risk due to her multiple medical problems. Hold lovenox, NPO after MN.  Marybelle Killings 03/14/2018, 1:27 PM

## 2018-03-14 NOTE — H&P (View-Only) (Signed)
Reason for Consult:fall with displaced left femoral neck fracture Referring Physician:J. Verlon Au MD  Stefanie Vincent is an 77 y.o. female.  HPI: 77 year old female who lives here in [place] with her daughter with recent discharge from the hospital February 2019 with influenza and hypoxia.,  Was at home turn twisted fell suffering a closed displaced femoral neck fracture.  Patient has type 2 diabetes on insulin, COPD on inhalers, hypertension, heart failure, previous stroke, multiple myeloma 2016, renal insufficiency 2016.  Patient is a continued smoker 1 pack/day x 60 years.  Patient had acute pain with fall unable to ambulate.  Pain occurred immediately and his moderate severity unless she moves and then she has severe pain.  No past history of hip problems she denies syncopal episode no loss of consciousness.  No other injuries from the fall other than the left closed displaced femoral neck fracture.  Past Medical History:  Diagnosis Date  . Arthritis   . Blood transfusion without reported diagnosis   . COPD (chronic obstructive pulmonary disease) ([place])   . Diabetes mellitus without complication ([place])   . Hypertension   . Stroke [facility])     Past Surgical History:  Procedure Laterality Date  . amputation of foot    . CHOLECYSTECTOMY      Family History  Problem Relation Age of Onset  . Stroke Mother   . Cancer Father     Social History:  reports that she has been smoking cigarettes.  She has a 60.00 pack-year smoking history. She has never used smokeless tobacco. She reports that she does not drink alcohol or use drugs.  Allergies:  Allergies  Allergen Reactions  . Codeine     Makes patient depressed.    Medications: I have reviewed the patient's current medications.  Results for orders placed or performed during the hospital encounter of 03/14/18 (from the past 48 hour(s))  Basic metabolic panel     Status: Abnormal   Collection Time: 03/14/18  5:26 AM  Result Value  Ref Range   Sodium 136 135 - 145 mmol/L   Potassium 3.5 3.5 - 5.1 mmol/L   Chloride 101 101 - 111 mmol/L   CO2 20 (L) 22 - 32 mmol/L   Glucose, Bld 266 (H) 65 - 99 mg/dL   BUN 13 6 - 20 mg/dL   Creatinine, Ser 0.98 0.44 - 1.00 mg/dL   Calcium 9.8 8.9 - 10.3 mg/dL   GFR calc non Af Amer 54 (L) >60 mL/min   GFR calc Af Amer >60 >60 mL/min    Comment: (NOTE) The eGFR has been calculated using the CKD EPI equation. This calculation has not been validated in all clinical situations. eGFR's persistently <60 mL/min signify possible Chronic Kidney Disease.    Anion gap 15 5 - 15    Comment: Performed at [facility], [place] [address] [place], [place] [address]  CBC WITH DIFFERENTIAL     Status: Abnormal   Collection Time: 03/14/18  5:26 AM  Result Value Ref Range   WBC 11.7 (H) 4.0 - 10.5 K/uL   RBC 4.57 3.87 - 5.11 MIL/uL   Hemoglobin 13.1 12.0 - 15.0 g/dL   HCT 38.5 36.0 - 46.0 %   MCV 84.2 78.0 - 100.0 fL   MCH 28.7 26.0 - 34.0 pg   MCHC 34.0 30.0 - 36.0 g/dL   RDW 14.3 11.5 - 15.5 %   Platelets 270 150 - 400 K/uL   Neutrophils Relative % 77 %  Neutro Abs 8.9 (H) 1.7 - 7.7 K/uL   Lymphocytes Relative 17 %   Lymphs Abs 2.0 0.7 - 4.0 K/uL   Monocytes Relative 4 %   Monocytes Absolute 0.5 0.1 - 1.0 K/uL   Eosinophils Relative 2 %   Eosinophils Absolute 0.3 0.0 - 0.7 K/uL   Basophils Relative 0 %   Basophils Absolute 0.0 0.0 - 0.1 K/uL    Comment: Performed at [facility], [place] [address], [place], [place] [address]  Protime-INR     Status: None   Collection Time: 03/14/18  5:26 AM  Result Value Ref Range   Prothrombin Time 11.5 11.4 - 15.2 seconds   INR 0.85     Comment: Performed at [facility], [place] [address], [place], [place] [address]  Type and screen [place]     Status: None   Collection Time: 03/14/18  5:35 AM  Result Value Ref Range   ABO/RH(D) O POS    Antibody Screen NEG    Sample  Expiration      03/17/2018 Performed at [facility], [place] [address] [place], [place] [address]   Glucose, capillary     Status: Abnormal   Collection Time: 03/14/18 12:27 PM  Result Value Ref Range   Glucose-Capillary 304 (H) 65 - 99 mg/dL    Ct Head Wo Contrast  Result Date: 03/14/2018 CLINICAL DATA:  77 year old female with fall. EXAM: CT HEAD WITHOUT CONTRAST CT CERVICAL SPINE WITHOUT CONTRAST TECHNIQUE: Multidetector CT imaging of the head and cervical spine was performed following the standard protocol without intravenous contrast. Multiplanar CT image reconstructions of the cervical spine were also generated. COMPARISON:  None. FINDINGS: CT HEAD FINDINGS Brain: There is mild age-related atrophy and moderate chronic microvascular ischemic changes. There is no acute intracranial hemorrhage. No mass effect or midline shift. No extra-axial fluid collection. Vascular: No hyperdense vessel or unexpected calcification. Skull: Normal. Negative for fracture or focal lesion. Sinuses/Orbits: No acute finding. Other: None CT CERVICAL SPINE FINDINGS Alignment: No acute subluxation. Skull base and vertebrae: No acute fracture.  Osteopenia. Soft tissues and spinal canal: No prevertebral fluid or swelling. No visible canal hematoma. Disc levels: Multilevel degenerative changes most prominent at C3-C4 and C5-C6. Upper chest: Mild interstitial coarsening and interlobular septal prominence may represent a degree of interstitial edema. Clinical correlation is recommended. There is atherosclerotic calcification of the aortic arch. Bilateral carotid bulb calcified plaques noted. Other: None IMPRESSION: 1. No acute intracranial hemorrhage. Age-related atrophy and chronic microvascular ischemic changes. 2. No acute/traumatic cervical spine pathology. Osteopenia with multilevel degenerative changes. 3. Bilateral carotid bulb calcified plaques. Electronically Signed   By: Anner Crete M.D.   On:  03/14/2018 06:46   Ct Cervical Spine Wo Contrast  Result Date: 03/14/2018 CLINICAL DATA:  77 year old female with fall. EXAM: CT HEAD WITHOUT CONTRAST CT CERVICAL SPINE WITHOUT CONTRAST TECHNIQUE: Multidetector CT imaging of the head and cervical spine was performed following the standard protocol without intravenous contrast. Multiplanar CT image reconstructions of the cervical spine were also generated. COMPARISON:  None. FINDINGS: CT HEAD FINDINGS Brain: There is mild age-related atrophy and moderate chronic microvascular ischemic changes. There is no acute intracranial hemorrhage. No mass effect or midline shift. No extra-axial fluid collection. Vascular: No hyperdense vessel or unexpected calcification. Skull: Normal. Negative for fracture or focal lesion. Sinuses/Orbits: No acute finding. Other: None CT CERVICAL SPINE FINDINGS Alignment: No acute subluxation. Skull base and vertebrae: No acute fracture.  Osteopenia. Soft  tissues and spinal canal: No prevertebral fluid or swelling. No visible canal hematoma. Disc levels: Multilevel degenerative changes most prominent at C3-C4 and C5-C6. Upper chest: Mild interstitial coarsening and interlobular septal prominence may represent a degree of interstitial edema. Clinical correlation is recommended. There is atherosclerotic calcification of the aortic arch. Bilateral carotid bulb calcified plaques noted. Other: None IMPRESSION: 1. No acute intracranial hemorrhage. Age-related atrophy and chronic microvascular ischemic changes. 2. No acute/traumatic cervical spine pathology. Osteopenia with multilevel degenerative changes. 3. Bilateral carotid bulb calcified plaques. Electronically Signed   By: Anner Crete M.D.   On: 03/14/2018 06:46   Dg Chest Port 1 View  Result Date: 03/14/2018 CLINICAL DATA:  Hip fracture.  Preoperative study. EXAM: PORTABLE CHEST 1 VIEW COMPARISON:  December 27, 2017 FINDINGS: The heart size and mediastinal contours are within normal  limits. Both lungs are clear. The visualized skeletal structures are unremarkable. IMPRESSION: No active disease. Electronically Signed   By: Dorise Bullion III M.D   On: 03/14/2018 08:20   Dg Hip Unilat With Pelvis 2-3 Views Left  Result Date: 03/14/2018 CLINICAL DATA:  77 y/o F; left buttocks and hip pain. Unable to bear weight on the left hip. EXAM: DG HIP (WITH OR WITHOUT PELVIS) 2-3V LEFT COMPARISON:  None. FINDINGS: Acute fracture of the left femoral neck with proximal migration of the femoral shaft and coxa vara deformity. No pelvic fracture or diastasis. Vascular calcifications noted. IMPRESSION: Acute fracture of left femoral neck with proximal migration of femoral shaft and coxa vara deformity. Electronically Signed   By: Kristine Garbe M.D.   On: 03/14/2018 06:38    ROS 14 point review of system positive for asthma, COPD, congestive heart failure, multiple myeloma 2016, renal insufficiency, type 2 diabetes on insulin, COPD still smoking history of influenza type A, community-acquired pneumonia history of acute respiratory failure with hypoxia.  Otherwise negative as it pertains HPI. Blood pressure (!) 175/60, pulse 68, temperature 97.8 F (36.6 C), temperature source Oral, resp. rate 16, height '5\' 2"'$  (1.575 m), weight 147 lb (66.7 kg), SpO2 95 %. Physical Exam  Constitutional: She is oriented to person, place, and time. She appears well-developed and well-nourished.  HENT:  Head: Normocephalic and atraumatic.  Eyes: Pupils are equal, round, and reactive to light. Conjunctivae are normal.  Neck: Normal range of motion. Neck supple. No tracheal deviation present. No thyromegaly present.  Cardiovascular: Normal rate and regular rhythm.  Respiratory: Effort normal. No respiratory distress.  GI: Soft. She exhibits no distension. There is no tenderness.  Musculoskeletal:  Left leg short externally rotated.  Extraction is currently being applied by orthopedic tech.  Distal pulses  are intact sciatic sensation is intact.  No pain with opposite right hip range of motion.  No swelling in the knees.  Skin over the hip is intact.  Neurological: She is alert and oriented to person, place, and time.  Skin: Skin is warm and dry.  Psychiatric: She has a normal mood and affect. Her behavior is normal. Judgment and thought content normal.    Assessment/Plan: Displaced left femoral neck fracture is a 77 year old female with multiple problems including history of heart failure COPD, previous CVA.  Plan left hip bipolar hemiarthroplasty.  Procedure discussed and answered she understands and requests we proceed.  She has some increased surgical risk due to her multiple medical problems. Hold lovenox, NPO after MN.  Stefanie Vincent 03/14/2018, 1:27 PM

## 2018-03-14 NOTE — ED Notes (Signed)
Patient transported to CT 

## 2018-03-15 ENCOUNTER — Encounter (HOSPITAL_COMMUNITY): Payer: Self-pay | Admitting: *Deleted

## 2018-03-15 ENCOUNTER — Inpatient Hospital Stay (HOSPITAL_COMMUNITY): Payer: Medicare Other | Admitting: Certified Registered Nurse Anesthetist

## 2018-03-15 ENCOUNTER — Encounter (HOSPITAL_COMMUNITY)
Admission: EM | Disposition: A | Discharge status: Skilled Nursing Facility | Payer: Self-pay | Source: Home / Self Care | Attending: Internal Medicine

## 2018-03-15 ENCOUNTER — Inpatient Hospital Stay (HOSPITAL_COMMUNITY): Payer: Medicare Other

## 2018-03-15 DIAGNOSIS — S72002A Fracture of unspecified part of neck of left femur, initial encounter for closed fracture: Principal | ICD-10-CM

## 2018-03-15 HISTORY — PX: HIP ARTHROPLASTY: SHX981

## 2018-03-15 LAB — CBC
HCT: 36 % (ref 36.0–46.0)
Hemoglobin: 12.1 g/dL (ref 12.0–15.0)
MCH: 28.8 pg (ref 26.0–34.0)
MCHC: 33.6 g/dL (ref 30.0–36.0)
MCV: 85.7 fL (ref 78.0–100.0)
PLATELETS: 232 10*3/uL (ref 150–400)
RBC: 4.2 MIL/uL (ref 3.87–5.11)
RDW: 14.4 % (ref 11.5–15.5)
WBC: 11.2 10*3/uL — ABNORMAL HIGH (ref 4.0–10.5)

## 2018-03-15 LAB — BASIC METABOLIC PANEL
Anion gap: 13 (ref 5–15)
BUN: 20 mg/dL (ref 6–20)
CO2: 22 mmol/L (ref 22–32)
CREATININE: 0.88 mg/dL (ref 0.44–1.00)
Calcium: 9.2 mg/dL (ref 8.9–10.3)
Chloride: 99 mmol/L — ABNORMAL LOW (ref 101–111)
GFR calc Af Amer: >60 mL/min (ref >60)
GFR calc non Af Amer: >60 mL/min (ref >60)
Glucose, Bld: 253 mg/dL — ABNORMAL HIGH (ref 65–99)
Potassium: 3.6 mmol/L (ref 3.5–5.1)
SODIUM: 134 mmol/L — AB (ref 135–145)

## 2018-03-15 LAB — GLUCOSE, CAPILLARY
GLUCOSE-CAPILLARY: 232 mg/dL — AB (ref 65–99)
GLUCOSE-CAPILLARY: 189 mg/dL — AB (ref 65–99)
GLUCOSE-CAPILLARY: 242 mg/dL — AB (ref 65–99)
GLUCOSE-CAPILLARY: 243 mg/dL — AB (ref 65–99)
Glucose-Capillary: 220 mg/dL — ABNORMAL HIGH (ref 65–99)

## 2018-03-15 LAB — SURGICAL PCR SCREEN
MRSA, PCR: NEGATIVE
Staphylococcus aureus: NEGATIVE

## 2018-03-15 LAB — ABO/RH: ABO/RH(D): O POS

## 2018-03-15 SURGERY — HEMIARTHROPLASTY, HIP, DIRECT ANTERIOR APPROACH, FOR FRACTURE
Anesthesia: Spinal | Site: Hip | Laterality: Left

## 2018-03-15 MED ORDER — PROPOFOL 10 MG/ML IV BOLUS
INTRAVENOUS | Status: DC | PRN
  Administered 2018-03-15 (×2): 10 mg via INTRAVENOUS

## 2018-03-15 MED ORDER — PROPOFOL 10 MG/ML IV BOLUS
INTRAVENOUS | Status: AC
  Filled 2018-03-15: qty 20

## 2018-03-15 MED ORDER — STERILE WATER FOR IRRIGATION IR SOLN
Status: DC | PRN
  Administered 2018-03-15: 1000 mL

## 2018-03-15 MED ORDER — PHENOL 1.4 % MT LIQD: 1.0000 | OROMUCOSAL | Status: DC | PRN

## 2018-03-15 MED ORDER — ONDANSETRON HCL 4 MG/2ML IJ SOLN: 4.0000 mg | Freq: Four times a day (QID) | INTRAMUSCULAR | Status: DC | PRN

## 2018-03-15 MED ORDER — CHOLECALCIFEROL 10 MCG (400 UNIT) PO TABS
400.0000 [IU] | ORAL_TABLET | Freq: Every day | ORAL | Status: DC
  Administered 2018-03-16 – 2018-03-17 (×2): 400 [IU] via ORAL
  Filled 2018-03-15 (×2): qty 1

## 2018-03-15 MED ORDER — MENTHOL 3 MG MT LOZG: 1.0000 | LOZENGE | OROMUCOSAL | Status: DC | PRN

## 2018-03-15 MED ORDER — FENTANYL CITRATE (PF) 100 MCG/2ML IJ SOLN
INTRAMUSCULAR | Status: DC | PRN
  Administered 2018-03-15: 50 ug via INTRAVENOUS

## 2018-03-15 MED ORDER — DOCUSATE SODIUM 100 MG PO CAPS
100.0000 mg | ORAL_CAPSULE | Freq: Two times a day (BID) | ORAL | Status: DC
  Administered 2018-03-16 – 2018-03-17 (×3): 100 mg via ORAL
  Filled 2018-03-15 (×3): qty 1

## 2018-03-15 MED ORDER — METOCLOPRAMIDE HCL 5 MG/ML IJ SOLN: 5.0000 mg | Freq: Three times a day (TID) | INTRAMUSCULAR | Status: DC | PRN

## 2018-03-15 MED ORDER — MIDAZOLAM HCL 2 MG/2ML IJ SOLN
INTRAMUSCULAR | Status: AC
  Filled 2018-03-15: qty 2

## 2018-03-15 MED ORDER — CEFAZOLIN SODIUM-DEXTROSE 2-4 GM/100ML-% IV SOLN
INTRAVENOUS | Status: AC
  Filled 2018-03-15: qty 100

## 2018-03-15 MED ORDER — ONDANSETRON HCL 4 MG/2ML IJ SOLN
INTRAMUSCULAR | Status: DC | PRN
Start: 2018-03-15 — End: 2018-03-15
  Administered 2018-03-15: 4 mg via INTRAVENOUS

## 2018-03-15 MED ORDER — CHLORHEXIDINE GLUCONATE 4 % EX LIQD: 60.0000 mL | Freq: Once | CUTANEOUS | Status: DC

## 2018-03-15 MED ORDER — HYDROMORPHONE HCL 1 MG/ML IJ SOLN: 0.2500 mg | INTRAMUSCULAR | Status: DC | PRN

## 2018-03-15 MED ORDER — ONDANSETRON HCL 4 MG/2ML IJ SOLN
INTRAMUSCULAR | Status: AC
  Filled 2018-03-15: qty 2

## 2018-03-15 MED ORDER — BUPIVACAINE IN DEXTROSE 0.75-8.25 % IT SOLN
INTRATHECAL | Status: DC | PRN
  Administered 2018-03-15: 2 mL via INTRATHECAL

## 2018-03-15 MED ORDER — BUPIVACAINE HCL (PF) 0.5 % IJ SOLN
INTRAMUSCULAR | Status: AC
  Filled 2018-03-15: qty 30

## 2018-03-15 MED ORDER — PROPOFOL 500 MG/50ML IV EMUL
INTRAVENOUS | Status: DC | PRN
  Administered 2018-03-15: 50 ug/kg/min via INTRAVENOUS

## 2018-03-15 MED ORDER — ONDANSETRON HCL 4 MG/2ML IJ SOLN: 4.0000 mg | Freq: Once | INTRAMUSCULAR | Status: DC | PRN

## 2018-03-15 MED ORDER — FENTANYL CITRATE (PF) 100 MCG/2ML IJ SOLN
INTRAMUSCULAR | Status: AC
  Filled 2018-03-15: qty 2

## 2018-03-15 MED ORDER — MEPERIDINE HCL 50 MG/ML IJ SOLN: 6.2500 mg | INTRAMUSCULAR | Status: DC | PRN

## 2018-03-15 MED ORDER — POVIDONE-IODINE 10 % EX SWAB: 2.0000 "application " | Freq: Once | CUTANEOUS | Status: DC

## 2018-03-15 MED ORDER — ASPIRIN EC 325 MG PO TBEC
325.0000 mg | DELAYED_RELEASE_TABLET | Freq: Every day | ORAL | Status: DC
  Administered 2018-03-16 – 2018-03-17 (×2): 325 mg via ORAL
  Filled 2018-03-15 (×2): qty 1

## 2018-03-15 MED ORDER — CEFAZOLIN SODIUM-DEXTROSE 2-4 GM/100ML-% IV SOLN: 2.0000 g | INTRAVENOUS | Status: DC

## 2018-03-15 MED ORDER — ONDANSETRON HCL 4 MG PO TABS: 4.0000 mg | ORAL_TABLET | Freq: Four times a day (QID) | ORAL | Status: DC | PRN

## 2018-03-15 MED ORDER — METOCLOPRAMIDE HCL 5 MG PO TABS: 5.0000 mg | ORAL_TABLET | Freq: Three times a day (TID) | ORAL | Status: DC | PRN

## 2018-03-15 MED ORDER — MIDAZOLAM HCL 5 MG/5ML IJ SOLN
INTRAMUSCULAR | Status: DC | PRN
  Administered 2018-03-15: 1 mg via INTRAVENOUS
  Administered 2018-03-15 (×2): 0.5 mg via INTRAVENOUS

## 2018-03-15 MED ORDER — CEFAZOLIN SODIUM-DEXTROSE 2-3 GM-%(50ML) IV SOLR
INTRAVENOUS | Status: DC | PRN
  Administered 2018-03-15: 2 g via INTRAVENOUS

## 2018-03-15 MED ORDER — ENSURE ENLIVE PO LIQD
237.0000 mL | Freq: Two times a day (BID) | ORAL | Status: DC
  Administered 2018-03-16 – 2018-03-17 (×4): 237 mL via ORAL

## 2018-03-15 MED ORDER — EPHEDRINE 5 MG/ML INJ
INTRAVENOUS | Status: AC
Start: 2018-03-15 — End: ?
  Filled 2018-03-15: qty 10

## 2018-03-15 MED ORDER — EPHEDRINE SULFATE-NACL 50-0.9 MG/10ML-% IV SOSY
PREFILLED_SYRINGE | INTRAVENOUS | Status: DC | PRN
  Administered 2018-03-15 (×2): 5 mg via INTRAVENOUS

## 2018-03-15 MED ORDER — PANTOPRAZOLE SODIUM 40 MG PO TBEC
40.0000 mg | DELAYED_RELEASE_TABLET | Freq: Every day | ORAL | Status: DC
  Administered 2018-03-15 – 2018-03-17 (×3): 40 mg via ORAL
  Filled 2018-03-15 (×3): qty 1

## 2018-03-15 MED ORDER — CEFAZOLIN SODIUM-DEXTROSE 2-4 GM/100ML-% IV SOLN
2.0000 g | Freq: Four times a day (QID) | INTRAVENOUS | Status: AC
  Administered 2018-03-15 (×2): 2 g via INTRAVENOUS
  Filled 2018-03-15 (×2): qty 100

## 2018-03-15 MED ORDER — 0.9 % SODIUM CHLORIDE (POUR BTL) OPTIME
TOPICAL | Status: DC | PRN
  Administered 2018-03-15: 1000 mL

## 2018-03-15 MED ORDER — PRAVASTATIN SODIUM 40 MG PO TABS
40.0000 mg | ORAL_TABLET | Freq: Every day | ORAL | Status: DC
  Administered 2018-03-16 – 2018-03-17 (×2): 40 mg via ORAL
  Filled 2018-03-15 (×2): qty 1

## 2018-03-15 SURGICAL SUPPLY — 54 items
BLADE SAW SGTL 73X25 THK (BLADE) ×2 IMPLANT
BRUSH FEMORAL CANAL (MISCELLANEOUS) IMPLANT
CAPT HIP HEMI 2 ×1 IMPLANT
COVER SURGICAL LIGHT HANDLE (MISCELLANEOUS) ×2 IMPLANT
DRAPE INCISE IOBAN 66X45 STRL (DRAPES) ×2 IMPLANT
DRAPE ORTHO SPLIT 77X108 STRL (DRAPES) ×2
DRAPE POUCH INSTRU U-SHP 10X18 (DRAPES) ×2 IMPLANT
DRAPE SURG ORHT 6 SPLT 77X108 (DRAPES) ×2 IMPLANT
DRAPE U-SHAPE 47X51 STRL (DRAPES) ×2 IMPLANT
DRAPE WARM FLUID 44X44 (DRAPE) ×1 IMPLANT
DRSG PAD ABDOMINAL 8X10 ST (GAUZE/BANDAGES/DRESSINGS) IMPLANT
DURAPREP 26ML APPLICATOR (WOUND CARE) ×2 IMPLANT
ELECT BLADE TIP CTD 4 INCH (ELECTRODE) ×2 IMPLANT
ELECT REM PT RETURN 15FT ADLT (MISCELLANEOUS) ×2 IMPLANT
EVACUATOR 1/8 PVC DRAIN (DRAIN) ×1 IMPLANT
FACESHIELD WRAPAROUND (MASK) ×10 IMPLANT
FACESHIELD WRAPAROUND OR TEAM (MASK) ×5 IMPLANT
GAUZE SPONGE 4X4 12PLY STRL (GAUZE/BANDAGES/DRESSINGS) ×3 IMPLANT
GAUZE XEROFORM 5X9 LF (GAUZE/BANDAGES/DRESSINGS) ×2 IMPLANT
GLOVE BIOGEL PI IND STRL 8 (GLOVE) ×1 IMPLANT
GLOVE BIOGEL PI INDICATOR 8 (GLOVE) ×1
GLOVE ORTHO TXT STRL SZ7.5 (GLOVE) ×2 IMPLANT
GOWN STRL REUS W/TWL LRG LVL3 (GOWN DISPOSABLE) ×2 IMPLANT
HANDPIECE INTERPULSE COAX TIP (DISPOSABLE)
IMMOBILIZER KNEE 20 (SOFTGOODS) ×2 IMPLANT
IMMOBILIZER KNEE 20 THIGH 36 (SOFTGOODS) ×1 IMPLANT
KIT BASIN OR (CUSTOM PROCEDURE TRAY) ×2 IMPLANT
NDL MAYO CATGUT SZ4 TPR NDL (NEEDLE) ×1 IMPLANT
NEEDLE HYPO 22GX1.5 SAFETY (NEEDLE) IMPLANT
NEEDLE MAYO CATGUT SZ4 (NEEDLE) ×2 IMPLANT
NS IRRIG 1000ML POUR BTL (IV SOLUTION) ×4 IMPLANT
PACK TOTAL JOINT (CUSTOM PROCEDURE TRAY) ×2 IMPLANT
PAD ABD 8X10 STRL (GAUZE/BANDAGES/DRESSINGS) ×1 IMPLANT
PASSER SUT SWANSON 36MM LOOP (INSTRUMENTS) ×2 IMPLANT
POSITIONER SURGICAL ARM (MISCELLANEOUS) ×2 IMPLANT
SET HNDPC FAN SPRY TIP SCT (DISPOSABLE) IMPLANT
SPONGE LAP 18X18 RF (DISPOSABLE) IMPLANT
STAPLER VISISTAT 35W (STAPLE) ×2 IMPLANT
SUCTION FRAZIER HANDLE 12FR (TUBING) ×1
SUCTION TUBE FRAZIER 12FR DISP (TUBING) ×1 IMPLANT
SUT ETHIBOND NAB CT1 #1 30IN (SUTURE) ×8 IMPLANT
SUT VIC AB 0 CT1 27 (SUTURE) ×1
SUT VIC AB 0 CT1 27XBRD ANTBC (SUTURE) ×1 IMPLANT
SUT VIC AB 1 CT1 27 (SUTURE)
SUT VIC AB 1 CT1 27XBRD ANTBC (SUTURE) IMPLANT
SUT VIC AB 1 CTX 36 (SUTURE)
SUT VIC AB 1 CTX36XBRD ANBCTR (SUTURE) IMPLANT
SUT VIC AB 2-0 CT1 36 (SUTURE) ×6 IMPLANT
SYR 30ML LL (SYRINGE) IMPLANT
TAPE CLOTH SURG 6X10 WHT LF (GAUZE/BANDAGES/DRESSINGS) ×1 IMPLANT
TOWEL OR 17X26 10 PK STRL BLUE (TOWEL DISPOSABLE) ×4 IMPLANT
TOWER CARTRIDGE SMART MIX (DISPOSABLE) IMPLANT
TRAY FOLEY MTR SLVR 16FR STAT (SET/KITS/TRAYS/PACK) ×2 IMPLANT
WATER STERILE IRR 1000ML POUR (IV SOLUTION) ×2 IMPLANT

## 2018-03-15 NOTE — Anesthesia Procedure Notes (Signed)
Spinal  Patient location during procedure: OR Start time: 03/15/2018 7:42 AM End time: 03/15/2018 7:47 AM Staffing Anesthesiologist: Lillia Abed, MD Performed: anesthesiologist  Preanesthetic Checklist Completed: patient identified, surgical consent, pre-op evaluation, timeout performed, IV checked, risks and benefits discussed and monitors and equipment checked Spinal Block Patient position: sitting Prep: DuraPrep Patient monitoring: blood pressure, continuous pulse ox, cardiac monitor and heart rate Approach: right paramedian Location: L3-4 Injection technique: single-shot Needle Needle type: Sprotte  Needle gauge: 24 G Needle length: 9 cm Needle insertion depth: 5 cm

## 2018-03-15 NOTE — Anesthesia Procedure Notes (Signed)
Date/Time: 03/15/2018 7:33 AM Performed by: Claudia Desanctis, CRNA Oxygen Delivery Method: Simple face mask

## 2018-03-15 NOTE — Interval H&P Note (Signed)
History and Physical Interval Note:  03/15/2018 7:20 AM  Stefanie Vincent  has presented today for surgery, with the diagnosis of left hip fracture  The various methods of treatment have been discussed with the patient and family. After consideration of risks, benefits and other options for treatment, the patient has consented to  Procedure(s): ARTHROPLASTY BIPOLAR HIP (HEMIARTHROPLASTY) (Left) as a surgical intervention .  The patient's history has been reviewed, patient examined, no change in status, stable for surgery.  I have reviewed the patient's chart and labs.  Questions were answered to the patient's satisfaction.     Stefanie Vincent

## 2018-03-15 NOTE — Progress Notes (Signed)
Initial Nutrition Assessment  INTERVENTION:   Provide Ensure Enlive po BID, each supplement provides 350 kcal and 20 grams of protein  NUTRITION DIAGNOSIS:   Increased nutrient needs related to post-op healing, hip fracture as evidenced by estimated needs.  GOAL:   Patient will meet greater than or equal to 90% of their needs  MONITOR:   PO intake, Supplement acceptance, Weight trends, Labs, I & O's  REASON FOR ASSESSMENT:   Consult Hip fracture protocol  ASSESSMENT:   77 year old female who lives here in [place] with her daughter with recent discharge from the hospital February 2019 with influenza and hypoxia.,  Was at home turn twisted fell suffering a closed displaced femoral neck fracture.  Patient in OR for hip hemiarthroplasty at time of visit. Pt reported per MST, no weight loss or appetite issues. Will order Ensure supplements to aid in healing from surgery.   No weight loss noted in chart.  Medications: Colace capsule BID, Lactated Ringers infusion at 50 ml/hr Labs reviewed: CBGs: 189-232 Low Na   NUTRITION - FOCUSED PHYSICAL EXAM:  Pt in OR.  Diet Order:   Diet Order           Diet Carb Modified Fluid consistency: Thin; Room service appropriate? Yes  Diet effective now          EDUCATION NEEDS:   No education needs have been identified at this time  Skin:  Skin Assessment: Reviewed RN Assessment  Last BM:  5/17  Height:   Ht Readings from Last 1 Encounters:  03/14/18 5\' 2"  (1.575 m)    Weight:   Wt Readings from Last 1 Encounters:  03/14/18 147 lb (66.7 kg)    Ideal Body Weight:  52.3 kg  BMI:  Body mass index is 26.89 kg/m.  Estimated Nutritional Needs:   Kcal:  1650-1850  Protein:  70-80g  Fluid:  1.8L/day  Clayton Bibles, MS, RD, LDN [facility] Pager: [phone] After Hours Pager: [phone]

## 2018-03-15 NOTE — Op Note (Signed)
Preop diagnosis: Displaced left femoral neck fracture.  Postop diagnosis: Same  Procedure: Left hip bipolar hemiarthroplasty.  Depuy  #4 Summit standard press-fit stem.  +1.5 neck 44 mm snap on bipolar head.  Anesthesia: Spinal  EBL: See anesthetic record  Procedure: After standard prepping and draping with patient lateral position after spinal anesthesia Monserat Prestigiacomo 7 frame was used for securing the patient lateral position with axillary roll.  Preoperative antibodies were given.  As the dura prep was drying timeout procedure was completed.  Usual impervious stockinette split sheets draped sterile skin marker and Betadine Steri-Drape x2 was used to seal the skin.  Posterior approach was made.  Trial retractor was placed after splitting gluteus medius sciatic nerve was identified protected.  Piriformis was tagged cut posterior capsule was opened head was removed with a corkscrew measured at 44 with good suction fit with a trial.  Lateralization cookie-cutter lateralizer reamers sequential broaching after reaming was performed progressing up to a #4 which gave excellent tight fit.  Trial sizers with a 44 mm trial head +1.5 gave good stability and restoration of neck length.  Irrigation with saline solution placement of the permanent #4 stem.  Calcar was intact trunnion was cleaned bipolar was assembled popped on and then protecting the sciatic nerve the hip was reduced again with good stability flexion and 90 internal rotation 80 degrees with abduction.  Piriformis was repaired back to gluteus medius sutures placed in the capsule tensor fascia was closed with #1 Vicryl sutures and gluteus maximus fascia 2-0 Vicryl subtenons tissue skin staple closure postop dressing and knee immobilizer.  Patient tolerated procedure well was transferred to recovery room in stable condition.

## 2018-03-15 NOTE — Progress Notes (Signed)
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  Came to the hospital secondary to severe pain in Left Hip after mechanical fall. Found to have a Displaced Left femoral Neck Fracture and was taken to the OR for a Left Hip Bipolar Hemiarthroplasty 03/15/18.  Assessment & Plan:   Active Problems:   Hip fracture (HCC)  Left Hip Fracture -Left Hip X-Ray showed Acute fracture of left femoral neck with proximal migration of femoral shaft and coxa vara deformity. -Placed in Buck's traction 3 pounds until Orthopedics can see -Orthopedic surgery evaluated and took the patient to the OR for a left hip bipolar hemiarthroplasty done on 03/15/2018 by Dr. Lorin Mercy -Pain control with p.o. hydrocodone-acetaminophen 1 to 2 tablets every 6 hours as needed for moderate pain, as well as IV morphine 2.5 mg every 2 as needed for severe pain -Continue with Robaxin 500 mg p.o. every 6 as needed for muscle spasms -Continue with antiemetics with Zofran 4 mg p.o./IV every 6 hours as needed for nausea and with Metoclopramide 5 to 10 mg p.o./IV every 8 hours as needed for nausea if Zofran is not effective -bowel regimen with Bisacodyl suppository 10 mg rectally daily as needed for moderate constipation, Docusate Sodium 100 mg p.o. twice daily, and MiraLAX 17 g p.o. daily as needed for moderate constipation -Continue with IV fluid rehydration with activity Ringer's at 50 mL's per hour continuous and  likely stop tomorrow -Surgical prophylactic antibiotics with cefazolin per orthopedic surgery -DVT prophylaxis with enoxaparin 30 mg subcu every 24 hours -PT/OT to evaluate and treat likely patient will need to go to SNF  Cor Pulmonale, Diastolic Heart Failure -Probably secondary to smoking, OHS, and Obese Habitus -Continue current medications with Losartan 25 mg daily and Atenolol 50 mg daily  -Gentle IV fluid hydration with lactated Ringer at 50 mL's per hour as above -Continue to monitor Strict I's and O's, Daily Weights; she is +525 mL's since admission -Weight was 147 pounds -Continue to monitor volume status extremely carefully  Insulin Dependent Diabetes Mellitus Type 2 -Takes only 2 units of 70/30 insulin with meals -Would not cover at this stage unless we see blood sugars above 140 perioperatively -Continue with Sensitive Novolog SSI AC and Novolog 3 units sq TIDwm -CBGs ranging from 189 to 242  Tobacco Abuse/Smoker -Smoking Cessation Counseling Given -C/w Nicotine Patch 14 mg TD  COPD -Currently not in Exacerbation -Will continue currently Tiotropium 18 mcg daily and Albuterol 2.5 mg Neb q6hprn Wheezing/SOB as needed  HTN -Continue with Losartan 25 mg p.o. daily  HLD -Continue with Pravastatin 40 mg po with breakfast  Vitamin D Deficiency -Continue with Vitamin D 400 units p.o. daily  Hx of Lacunar CVA -C/w ASA 325 mg po Daily and with pravastatin 40 mg po with breakfast -Per Neurology clinic there is no need for Plavix at this time  Leukocytosis -Mild.  Patient's WBC was 11.7 on admission is now 11.2 -Likely secondary to pain -Continue to monitor for signs and symptoms of bleeding -Repeat CBC  in a.m.  Hyponatremia -Mild at 134 -Continue to monitor and repeat CMP in a.m.  DVT prophylaxis: Lovenox 30 mg subcu every 24 hours per Ortho Code Status: FULL CODE Family Communication: No family present at bedside Disposition Plan: Anticipate SNF at  D/C  Consultants:   Orthopedic Surgery Dr. Lorin Mercy    Procedures: Left Hip Bipolar Hemiarthroplasty    Antimicrobials:  Anti-infectives (From admission, onward)   Start     Dose/Rate Route Frequency Ordered Stop   03/15/18 1500  ceFAZolin (ANCEF) IVPB 2g/100 mL premix     2 g 200 mL/hr over 30 Minutes Intravenous Every 6 hours 03/15/18 1108 03/16/18 0259   03/15/18 1115  ceFAZolin (ANCEF) IVPB 2g/100 mL premix  Status:  Discontinued     2 g 200 mL/hr over 30 Minutes Intravenous On call to O.R. 03/15/18 1112 03/15/18 1113   03/15/18 0651  ceFAZolin (ANCEF) 2-4 GM/100ML-% IVPB    Note to Pharmacy:  Dellie Catholic   : cabinet override      03/15/18 0651 03/15/18 1859     Subjective: Seen and examined at bedside and had just come back from surgery.  States that she felt okay and pain was adequately controlled at this time.  No nausea, vomiting, shortness breath, chest pain, or lightheadedness or dizziness.  Denies any other complaints or concerns however felt cold.  Objective: Vitals:   03/15/18 1115 03/15/18 1220 03/15/18 1310 03/15/18 1416  BP:  (!) 142/51 (!) 152/53 135/60  Pulse: 63 66 68 68  Resp:  18 20 20   Temp:  98.9 F (37.2 C) 98.3 F (36.8 C) 98.3 F (36.8 C)  TempSrc:  Oral Oral Oral  SpO2: 93% 100% 95% 95%  Weight:      Height:        Intake/Output Summary (Last 24 hours) at 03/15/2018 1458 Last data filed at 03/15/2018 1230 Gross per 24 hour  Intake 1125.8 ml  Output 1275 ml  Net -149.2 ml   Filed Weights   03/14/18 0355  Weight: 66.7 kg (147 lb)   Examination: Physical Exam:  Constitutional: WN/WD Caucasian female in NAD and appears calm and comfortable Eyes: Lids and conjunctivae normal, sclerae anicteric  ENMT: External Ears, Nose appear normal. Grossly normal hearing. Mucous membranes are moist. Neck: Appears normal, supple, no cervical masses, normal ROM, no appreciable thyromegaly, no JVD Respiratory: Diminished to auscultation bilaterally, no  wheezing, rales, rhonchi or crackles. Normal respiratory effort and patient is not tachypenic. No accessory muscle use.  Cardiovascular: RRR, no appreciable murmurs / rubs / gallops. S1 and S2 auscultated. Trace extremity edema. Abdomen: Soft, non-tender, Distended 2/2 body habitus. No masses palpated. No appreciable hepatosplenomegaly. Bowel sounds positive x4.  GU: Deferred. Musculoskeletal: Has a right transmetatarsal amputation. Skin: No rashes, lesions, ulcers on a limited skin evaluation. No induration; Warm and dry.  Left hip incisions appear clean dry and intact Neurologic: CN 2-12 grossly intact with no focal deficits. Romberg sign and cerebellar reflexes not assessed.  Psychiatric: Normal judgment and insight. Alert and oriented x 3. Normal mood and appropriate affect.   Data Reviewed: I have personally reviewed following labs and imaging studies  CBC: Recent Labs  Lab 03/14/18 0526 03/15/18 0508  WBC 11.7* 11.2*  NEUTROABS 8.9*  --   HGB 13.1 12.1  HCT 38.5 36.0  MCV 84.2 85.7  PLT 270 696   Basic Metabolic Panel: Recent Labs  Lab 03/14/18 0526 03/15/18 0508  NA 136 134*  K 3.5 3.6  CL 101  99*  CO2 20* 22  GLUCOSE 266* 253*  BUN 13 20  CREATININE 0.98 0.88  CALCIUM 9.8 9.2   GFR: Estimated Creatinine Clearance: 47.2 mL/min (by C-G formula based on SCr of 0.88 mg/dL). Liver Function Tests: No results for input(s): AST, ALT, ALKPHOS, BILITOT, PROT, ALBUMIN in the last 168 hours. No results for input(s): LIPASE, AMYLASE in the last 168 hours. No results for input(s): AMMONIA in the last 168 hours. Coagulation Profile: Recent Labs  Lab 03/14/18 0526  INR 0.85   Cardiac Enzymes: No results for input(s): CKTOTAL, CKMB, CKMBINDEX, TROPONINI in the last 168 hours. BNP (last 3 results) No results for input(s): PROBNP in the last 8760 hours. HbA1C: No results for input(s): HGBA1C in the last 72 hours. CBG: Recent Labs  Lab 03/14/18 1601 03/14/18 2010  03/15/18 0623 03/15/18 0900 03/15/18 1146  GLUCAP 323* 248* 220* 232* 189*   Lipid Profile: No results for input(s): CHOL, HDL, LDLCALC, TRIG, CHOLHDL, LDLDIRECT in the last 72 hours. Thyroid Function Tests: No results for input(s): TSH, T4TOTAL, FREET4, T3FREE, THYROIDAB in the last 72 hours. Anemia Panel: No results for input(s): VITAMINB12, FOLATE, FERRITIN, TIBC, IRON, RETICCTPCT in the last 72 hours. Sepsis Labs: No results for input(s): PROCALCITON, LATICACIDVEN in the last 168 hours.  Recent Results (from the past 240 hour(s))  Surgical PCR screen     Status: None   Collection Time: 03/14/18  5:15 PM  Result Value Ref Range Status   MRSA, PCR NEGATIVE NEGATIVE Final   Staphylococcus aureus NEGATIVE NEGATIVE Final    Comment: (NOTE) The Xpert SA Assay (FDA approved for NASAL specimens in patients 71 years of age and older), is one component of a comprehensive surveillance program. It is not intended to diagnose infection nor to guide or monitor treatment. Performed at [facility], [place] [address], [place], [place] [address]     Radiology Studies: Ct Head Wo Contrast  Result Date: 03/14/2018 CLINICAL DATA:  77 year old female with fall. EXAM: CT HEAD WITHOUT CONTRAST CT CERVICAL SPINE WITHOUT CONTRAST TECHNIQUE: Multidetector CT imaging of the head and cervical spine was performed following the standard protocol without intravenous contrast. Multiplanar CT image reconstructions of the cervical spine were also generated. COMPARISON:  None. FINDINGS: CT HEAD FINDINGS Brain: There is mild age-related atrophy and moderate chronic microvascular ischemic changes. There is no acute intracranial hemorrhage. No mass effect or midline shift. No extra-axial fluid collection. Vascular: No hyperdense vessel or unexpected calcification. Skull: Normal. Negative for fracture or focal lesion. Sinuses/Orbits: No acute finding. Other: None CT CERVICAL SPINE FINDINGS Alignment:  No acute subluxation. Skull base and vertebrae: No acute fracture.  Osteopenia. Soft tissues and spinal canal: No prevertebral fluid or swelling. No visible canal hematoma. Disc levels: Multilevel degenerative changes most prominent at C3-C4 and C5-C6. Upper chest: Mild interstitial coarsening and interlobular septal prominence may represent a degree of interstitial edema. Clinical correlation is recommended. There is atherosclerotic calcification of the aortic arch. Bilateral carotid bulb calcified plaques noted. Other: None IMPRESSION: 1. No acute intracranial hemorrhage. Age-related atrophy and chronic microvascular ischemic changes. 2. No acute/traumatic cervical spine pathology. Osteopenia with multilevel degenerative changes. 3. Bilateral carotid bulb calcified plaques. Electronically Signed   By: Anner Crete M.D.   On: 03/14/2018 06:46   Ct Cervical Spine Wo Contrast  Result Date: 03/14/2018 CLINICAL DATA:  77 year old female with fall. EXAM: CT HEAD WITHOUT CONTRAST CT CERVICAL SPINE WITHOUT CONTRAST TECHNIQUE: Multidetector CT imaging of the head and cervical spine was  performed following the standard protocol without intravenous contrast. Multiplanar CT image reconstructions of the cervical spine were also generated. COMPARISON:  None. FINDINGS: CT HEAD FINDINGS Brain: There is mild age-related atrophy and moderate chronic microvascular ischemic changes. There is no acute intracranial hemorrhage. No mass effect or midline shift. No extra-axial fluid collection. Vascular: No hyperdense vessel or unexpected calcification. Skull: Normal. Negative for fracture or focal lesion. Sinuses/Orbits: No acute finding. Other: None CT CERVICAL SPINE FINDINGS Alignment: No acute subluxation. Skull base and vertebrae: No acute fracture.  Osteopenia. Soft tissues and spinal canal: No prevertebral fluid or swelling. No visible canal hematoma. Disc levels: Multilevel degenerative changes most prominent at C3-C4 and  C5-C6. Upper chest: Mild interstitial coarsening and interlobular septal prominence may represent a degree of interstitial edema. Clinical correlation is recommended. There is atherosclerotic calcification of the aortic arch. Bilateral carotid bulb calcified plaques noted. Other: None IMPRESSION: 1. No acute intracranial hemorrhage. Age-related atrophy and chronic microvascular ischemic changes. 2. No acute/traumatic cervical spine pathology. Osteopenia with multilevel degenerative changes. 3. Bilateral carotid bulb calcified plaques. Electronically Signed   By: Anner Crete M.D.   On: 03/14/2018 06:46   Pelvis Portable  Result Date: 03/15/2018 CLINICAL DATA:  77 year old female with a history of left hip arthroplasty EXAM: PORTABLE PELVIS 1-2 VIEWS COMPARISON:  03/14/2018 FINDINGS: Bony pelvic ring appears intact with no acute fracture line identified. Surgical changes of the left hip with arthroplasty components and surgical staples. Gas within the soft tissues. No perihardware fracture. Mild right hip degenerative changes. IMPRESSION: Early surgical changes of left hip arthroplasty with no complicating features. Electronically Signed   By: Corrie Mckusick D.O.   On: 03/15/2018 09:22   Dg Chest Port 1 View  Result Date: 03/14/2018 CLINICAL DATA:  Hip fracture.  Preoperative study. EXAM: PORTABLE CHEST 1 VIEW COMPARISON:  December 27, 2017 FINDINGS: The heart size and mediastinal contours are within normal limits. Both lungs are clear. The visualized skeletal structures are unremarkable. IMPRESSION: No active disease. Electronically Signed   By: Dorise Bullion III M.D   On: 03/14/2018 08:20   Dg Hip Unilat With Pelvis 2-3 Views Left  Result Date: 03/14/2018 CLINICAL DATA:  77 y/o F; left buttocks and hip pain. Unable to bear weight on the left hip. EXAM: DG HIP (WITH OR WITHOUT PELVIS) 2-3V LEFT COMPARISON:  None. FINDINGS: Acute fracture of the left femoral neck with proximal migration of the  femoral shaft and coxa vara deformity. No pelvic fracture or diastasis. Vascular calcifications noted. IMPRESSION: Acute fracture of left femoral neck with proximal migration of femoral shaft and coxa vara deformity. Electronically Signed   By: Kristine Garbe M.D.   On: 03/14/2018 06:38   Scheduled Meds: . [START ON 03/16/2018] aspirin EC  325 mg Oral Q breakfast  . atenolol  50 mg Oral Daily  . docusate sodium  100 mg Oral BID  . enoxaparin (LOVENOX) injection  30 mg Subcutaneous Q24H  . feeding supplement (ENSURE ENLIVE)  237 mL Oral BID BM  . insulin aspart  0-9 Units Subcutaneous TID WC  . insulin aspart  3 Units Subcutaneous TID WC  . losartan  25 mg Oral Daily  . tiotropium  18 mcg Inhalation Daily   Continuous Infusions: . ceFAZolin    .  ceFAZolin (ANCEF) IV    . lactated ringers 50 mL/hr at 03/15/18 0059    LOS: 1 day   Kerney Elbe, DO [facility] Pager [phone]  If 7PM-7AM, please contact night-coverage  [email or web address] Password [facility] 03/15/2018, 2:58 PM

## 2018-03-15 NOTE — Anesthesia Postprocedure Evaluation (Signed)
Anesthesia Post Note  Patient: SAREA FYFE  Procedure(s) Performed: ARTHROPLASTY BIPOLAR HIP (HEMIARTHROPLASTY) (Left Hip)     Patient location during evaluation: PACU Anesthesia Type: Spinal Level of consciousness: oriented and awake and alert Pain management: pain level controlled Vital Signs Assessment: post-procedure vital signs reviewed and stable Respiratory status: spontaneous breathing, respiratory function stable and patient connected to nasal cannula oxygen Cardiovascular status: blood pressure returned to baseline and stable Postop Assessment: no headache, no backache and no apparent nausea or vomiting Anesthetic complications: no    Last Vitals:  Vitals:   03/15/18 1112 03/15/18 1115  BP: (!) 162/64   Pulse: (!) 59 63  Resp:    Temp: 36.5 C   SpO2:  93%    Last Pain:  Vitals:   03/15/18 1112  TempSrc: Oral  PainSc:                  Keanthony Poole DAVID

## 2018-03-15 NOTE — Transfer of Care (Signed)
Immediate Anesthesia Transfer of Care Note  Patient: Stefanie Vincent  Procedure(s) Performed: ARTHROPLASTY BIPOLAR HIP (HEMIARTHROPLASTY) (Left Hip)  Patient Location: PACU  Anesthesia Type:Spinal  Level of Consciousness: drowsy  Airway & Oxygen Therapy: Patient Spontanous Breathing and Patient connected to face mask  Post-op Assessment: Report given to RN and Post -op Vital signs reviewed and stable  Post vital signs: Reviewed and stable  Last Vitals:  Vitals Value Taken Time  BP    Temp    Pulse 60 03/15/2018  8:50 AM  Resp 32 03/15/2018  8:50 AM  SpO2 100 % 03/15/2018  8:50 AM  Vitals shown include unvalidated device data.  Last Pain:  Vitals:   03/15/18 0507  TempSrc: Oral  PainSc:       Patients Stated Pain Goal: 5 (86/76/72 0947)  Complications: No apparent anesthesia complications

## 2018-03-15 NOTE — Anesthesia Preprocedure Evaluation (Signed)
Anesthesia Evaluation  Patient identified by MRN, date of birth, ID band Patient awake    Reviewed: Allergy & Precautions, NPO status , Patient's Chart, lab work & pertinent test results  Airway Mallampati: I  TM Distance: >3 FB Neck ROM: Full    Dental   Pulmonary COPD, Current Smoker,    Pulmonary exam normal        Cardiovascular hypertension, Pt. on medications Normal cardiovascular exam     Neuro/Psych CVA    GI/Hepatic   Endo/Other  diabetes, Type 2, Oral Hypoglycemic Agents  Renal/GU      Musculoskeletal   Abdominal   Peds  Hematology   Anesthesia Other Findings   Reproductive/Obstetrics                             Anesthesia Physical Anesthesia Plan  ASA: III  Anesthesia Plan: Spinal   Post-op Pain Management:    Induction: Intravenous  PONV Risk Score and Plan: 1 and Ondansetron and Treatment may vary due to age or medical condition  Airway Management Planned: Simple Face Mask  Additional Equipment:   Intra-op Plan:   Post-operative Plan:   Informed Consent: I have reviewed the patients History and Physical, chart, labs and discussed the procedure including the risks, benefits and alternatives for the proposed anesthesia with the patient or authorized representative who has indicated his/her understanding and acceptance.     Plan Discussed with: CRNA and Surgeon  Anesthesia Plan Comments:         Anesthesia Quick Evaluation

## 2018-03-16 ENCOUNTER — Encounter (HOSPITAL_COMMUNITY): Payer: Self-pay | Admitting: Orthopaedic Surgery

## 2018-03-16 LAB — BASIC METABOLIC PANEL
Anion gap: 10 (ref 5–15)
BUN: 17 mg/dL (ref 6–20)
CALCIUM: 8.8 mg/dL — AB (ref 8.9–10.3)
CHLORIDE: 97 mmol/L — AB (ref 101–111)
CO2: 24 mmol/L (ref 22–32)
CREATININE: 0.88 mg/dL (ref 0.44–1.00)
GFR calc non Af Amer: >60 mL/min (ref >60)
Glucose, Bld: 233 mg/dL — ABNORMAL HIGH (ref 65–99)
Potassium: 3.5 mmol/L (ref 3.5–5.1)
SODIUM: 131 mmol/L — AB (ref 135–145)

## 2018-03-16 LAB — CBC
HCT: 32.2 % — ABNORMAL LOW (ref 36.0–46.0)
Hemoglobin: 10.9 g/dL — ABNORMAL LOW (ref 12.0–15.0)
MCH: 28.9 pg (ref 26.0–34.0)
MCHC: 33.9 g/dL (ref 30.0–36.0)
MCV: 85.4 fL (ref 78.0–100.0)
Platelets: 197 10*3/uL (ref 150–400)
RBC: 3.77 MIL/uL — ABNORMAL LOW (ref 3.87–5.11)
RDW: 13.9 % (ref 11.5–15.5)
WBC: 11.4 10*3/uL — ABNORMAL HIGH (ref 4.0–10.5)

## 2018-03-16 LAB — GLUCOSE, CAPILLARY
GLUCOSE-CAPILLARY: 238 mg/dL — AB (ref 65–99)
GLUCOSE-CAPILLARY: 301 mg/dL — AB (ref 65–99)
GLUCOSE-CAPILLARY: 342 mg/dL — AB (ref 65–99)

## 2018-03-16 MED ORDER — ASPIRIN 325 MG PO TBEC: 325.0000 mg | DELAYED_RELEASE_TABLET | Freq: Every day | ORAL | 0 refills | Status: AC

## 2018-03-16 MED ORDER — SODIUM CHLORIDE 0.9 % IV SOLN
INTRAVENOUS | Status: DC
  Administered 2018-03-16: 10:00:00 via INTRAVENOUS

## 2018-03-16 MED ORDER — HYDROCODONE-ACETAMINOPHEN 5-325 MG PO TABS: 1.0000 | ORAL_TABLET | Freq: Four times a day (QID) | ORAL | 0 refills | Status: AC | PRN

## 2018-03-16 MED ORDER — SODIUM CHLORIDE 0.9 % IV BOLUS
250.0000 mL | Freq: Once | INTRAVENOUS | Status: AC
  Administered 2018-03-16: 250 mL via INTRAVENOUS

## 2018-03-16 NOTE — Progress Notes (Signed)
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  Came to the hospital secondary to severe pain in Left Hip after mechanical fall. Found to have a Displaced Left femoral Neck Fracture and was taken to the OR for a Left Hip Bipolar Hemiarthroplasty 03/15/18.   Assessment & Plan:   Active Problems:   Hip fracture (HCC)  Left Hip Fracture s/p Left Hip Bipolar Hemiarthoplasty POD 1 -Left Hip X-Ray showed Acute fracture of left femoral neck with proximal migration of femoral shaft and coxa vara deformity. -Placed in Buck's traction 3 pounds until Orthopedics can see -Orthopedic surgery evaluated and took the patient to the OR for a left hip bipolar hemiarthroplasty done on 03/15/2018 by Dr. Lorin Mercy -Pain control with p.o. Hydrocodone-Acetaminophen 1 to 2 tablets every 6 hours as needed for moderate pain, as well as IV morphine 2.5 mg every 2 as needed for severe pain -Continue with Robaxin 500 mg p.o. every 6 as needed for muscle spasms -Continue with Antiemetics with Zofran 4 mg p.o./IV every 6 hours as needed for nausea and with Metoclopramide 5 to 10 mg p.o./IV every 8 hours as needed for nausea if Zofran is not effective -Bowel regimen with Bisacodyl suppository 10 mg rectally daily as needed for moderate constipation, Docusate Sodium 100 mg p.o. twice daily, and MiraLAX 17 g p.o. daily as needed for moderate constipation -IVF Rehydration with Lactated Ringer's at 50  mL/hr now stopped and started on NS at 50 mL/hr but will also stop to prevent volume overload now that patient is eating.  -Surgical prophylactic antibiotics with cefazolin per orthopedic surgery -DVT prophylaxis with enoxaparin 30 mg subcu every 24 hours changed to ASA 325 mg po with Breakfast per Ortho -WBAT and Up with Therapy per Ortho; Foley Catheter being removed today per Ortho  -PT/OT to evaluate recommending SNF  Cor Pulmonale, Diastolic Heart Failure -Probably secondary to smoking, OHS, and Obese Habitus -Continue current medications with Losartan 25 mg daily and Atenolol 50 mg daily  -Gentle IV fluid hydration with Lactated Ringer at 50 mL's per hour as above now Stopped  -Continue to monitor Strict I's and O's, Daily Weights; she is +525 mL's since admission -Weight was 147 pounds -Continue to monitor volume status extremely carefully  Insulin Dependent Diabetes Mellitus Type 2 -Takes only 2 units of 70/30 insulin with meals -Would not cover at this stage unless we see blood sugars above 140 perioperatively -Continue with Sensitive Novolog SSI AC and Novolog 3 units sq TIDwm -CBGs ranging from 238-301  Tobacco Abuse/Smoker -Smoking Cessation Counseling Given -C/w Nicotine Patch 14 mg TD  COPD -Currently not in Exacerbation -Will continue currently Tiotropium 18 mcg daily and Albuterol 2.5 mg Neb q6hprn Wheezing/SOB as needed  HTN -Continue with Losartan 25 mg p.o. daily  HLD -Continue with Pravastatin 40 mg po with breakfast  Vitamin D Deficiency -Continue with Vitamin D 400 units p.o. daily  Hx of Lacunar CVA -C/w ASA 325 mg po Daily (Increased from 81 mg to 325 mg po Daily  by Ortho) and with Pravastatin 40 mg po with breakfast -Per Neurology Clinic there is no need for Plavix at this time  Leukocytosis -Mild. Patient's WBC was 11.7 on admission is now 11.4 -Likely secondary to pain and Surgery  -Continue to monitor for signs and symptoms of  bleeding -Repeat CBC in AM.  Hyponatremia -Mild at 134 and went to 131 -Started Gentle IVF Rehydration with NS this AM after LR finished yesterday and now will stop this afternoon in avoidance of Volume Overload  -Continue to monitor and repeat CMP in a.m.  DVT prophylaxis: Lovenox 30 mg subcu every 24 hours per Ortho Code Status: FULL CODE Family Communication: No family present at bedside Disposition Plan: Anticipate SNF at D/C  Consultants:   Orthopedic Surgery Dr. Lorin Mercy    Procedures: Left Hip Bipolar Hemiarthroplasty    Antimicrobials:  Anti-infectives (From admission, onward)   Start     Dose/Rate Route Frequency Ordered Stop   03/15/18 1500  ceFAZolin (ANCEF) IVPB 2g/100 mL premix     2 g 200 mL/hr over 30 Minutes Intravenous Every 6 hours 03/15/18 1108 03/15/18 2047   03/15/18 1115  ceFAZolin (ANCEF) IVPB 2g/100 mL premix  Status:  Discontinued     2 g 200 mL/hr over 30 Minutes Intravenous On call to O.R. 03/15/18 1112 03/15/18 1113   03/15/18 0651  ceFAZolin (ANCEF) 2-4 GM/100ML-% IVPB    Note to Pharmacy:  Dellie Catholic   : cabinet override      03/15/18 0651 03/15/18 1859     Subjective: Seen and examined at bedside sitting in chair.  Had no complaints this morning however states that she had pain however she moved.  No chest pain, shortness of breath, nausea, vomiting.  No other complaints or concerns at this time and understands that she will need physical therapy prior to going back home.  Objective: Vitals:   03/16/18 0824 03/16/18 1023 03/16/18 1056 03/16/18 1343  BP: (!) 126/51   (!) 137/47  Pulse: (!) 57 64  66  Resp: 16   12  Temp: 98.5 F (36.9 C)   (!) 97.4 F (36.3 C)  TempSrc: Oral   Oral  SpO2: 90%  93% 98%  Weight:      Height:        Intake/Output Summary (Last 24 hours) at 03/16/2018 1359 Last data filed at 03/16/2018 1236 Gross per 24 hour  Intake 1820.13 ml  Output 1300 ml  Net 520.13 ml   Filed Weights   03/14/18 0355  Weight:  66.7 kg (147 lb)   Examination: Physical Exam:  Constitutional: Well-nourished, well-developed Caucasian female who is currently in no acute distress appears calm and comfortable Eyes: Sclera are anicteric.  Lids and conjunctival appear normal ENMT: External ears and nose appear normal.  Grossly normal hearing  Neck: Appears supple with no JVD Respiratory: Diminished to auscultation bilaterally.  No appreciable wheezing, rales, rhonchi.  Normal respiratory effort and patient not tachypneic or using accessory muscle breathing Cardiovascular: Regular rate rhythm.  Slight 2 out of 6 systolic murmur.  Mild lower extremity edema Abdomen: Soft, nontender, slightly distended secondary to body habitus.  Bowel sounds present GU: Deferred Musculoskeletal: Has a right transmetatarsal amputation Skin: No rashes or lesions on limited skin evaluation.  Warm and dry.  Left hip incision appeared clean dry and intact Neurologic: Nerves II through XII grossly intact no appreciable focal deficits Psychiatric: Normal mood and affect.  Intact judgment and insight.  Awake, alert, oriented x3  Data  Reviewed: I have personally reviewed following labs and imaging studies  CBC: Recent Labs  Lab 03/14/18 0526 03/15/18 0508 03/16/18 0533  WBC 11.7* 11.2* 11.4*  NEUTROABS 8.9*  --   --   HGB 13.1 12.1 10.9*  HCT 38.5 36.0 32.2*  MCV 84.2 85.7 85.4  PLT 270 232 510   Basic Metabolic Panel: Recent Labs  Lab 03/14/18 0526 03/15/18 0508 03/16/18 0533  NA 136 134* 131*  K 3.5 3.6 3.5  CL 101 99* 97*  CO2 20* 22 24  GLUCOSE 266* 253* 233*  BUN 13 20 17   CREATININE 0.98 0.88 0.88  CALCIUM 9.8 9.2 8.8*   GFR: Estimated Creatinine Clearance: 47.2 mL/min (by C-G formula based on SCr of 0.88 mg/dL). Liver Function Tests: No results for input(s): AST, ALT, ALKPHOS, BILITOT, PROT, ALBUMIN in the last 168 hours. No results for input(s): LIPASE, AMYLASE in the last 168 hours. No results for input(s):  AMMONIA in the last 168 hours. Coagulation Profile: Recent Labs  Lab 03/14/18 0526  INR 0.85   Cardiac Enzymes: No results for input(s): CKTOTAL, CKMB, CKMBINDEX, TROPONINI in the last 168 hours. BNP (last 3 results) No results for input(s): PROBNP in the last 8760 hours. HbA1C: No results for input(s): HGBA1C in the last 72 hours. CBG: Recent Labs  Lab 03/15/18 1146 03/15/18 1711 03/15/18 2155 03/16/18 0740 03/16/18 1145  GLUCAP 189* 242* 243* 238* 301*   Lipid Profile: No results for input(s): CHOL, HDL, LDLCALC, TRIG, CHOLHDL, LDLDIRECT in the last 72 hours. Thyroid Function Tests: No results for input(s): TSH, T4TOTAL, FREET4, T3FREE, THYROIDAB in the last 72 hours. Anemia Panel: No results for input(s): VITAMINB12, FOLATE, FERRITIN, TIBC, IRON, RETICCTPCT in the last 72 hours. Sepsis Labs: No results for input(s): PROCALCITON, LATICACIDVEN in the last 168 hours.  Recent Results (from the past 240 hour(s))  Surgical PCR screen     Status: None   Collection Time: 03/14/18  5:15 PM  Result Value Ref Range Status   MRSA, PCR NEGATIVE NEGATIVE Final   Staphylococcus aureus NEGATIVE NEGATIVE Final    Comment: (NOTE) The Xpert SA Assay (FDA approved for NASAL specimens in patients 45 years of age and older), is one component of a comprehensive surveillance program. It is not intended to diagnose infection nor to guide or monitor treatment. Performed at [facility], [place] [address] [place], [place] [address]     Radiology Studies: Pelvis Portable  Result Date: 03/15/2018 CLINICAL DATA:  77 year old female with a history of left hip arthroplasty EXAM: PORTABLE PELVIS 1-2 VIEWS COMPARISON:  03/14/2018 FINDINGS: Bony pelvic ring appears intact with no acute fracture line identified. Surgical changes of the left hip with arthroplasty components and surgical staples. Gas within the soft tissues. No perihardware fracture. Mild right hip degenerative  changes. IMPRESSION: Early surgical changes of left hip arthroplasty with no complicating features. Electronically Signed   By: Corrie Mckusick D.O.   On: 03/15/2018 09:22   Scheduled Meds: . aspirin EC  325 mg Oral Q breakfast  . atenolol  50 mg Oral Daily  . cholecalciferol  400 Units Oral Daily  . docusate sodium  100 mg Oral BID  . feeding supplement (ENSURE ENLIVE)  237 mL Oral BID BM  . insulin aspart  0-9 Units Subcutaneous TID WC  . insulin aspart  3 Units Subcutaneous TID WC  . losartan  25 mg Oral Daily  . pantoprazole  40 mg Oral Daily  . pravastatin  40 mg Oral  QPC breakfast  . tiotropium  18 mcg Inhalation Daily   Continuous Infusions: . sodium chloride 50 mL/hr at 03/16/18 1023    LOS: 2 days   Kerney Elbe, DO [facility] Pager [phone]  If 7PM-7AM, please contact night-coverage [email or web address] Password [facility] 03/16/2018, 1:59 PM

## 2018-03-16 NOTE — Progress Notes (Signed)
   Subjective: 1 Day Post-Op Procedure(s) (LRB): ARTHROPLASTY BIPOLAR HIP (HEMIARTHROPLASTY) (Left) Patient reports pain as moderate.    Objective: Vital signs in last 24 hours: Temp:  [97.7 F (36.5 C)-99.6 F (37.6 C)] 99.4 F (37.4 C) (05/13 0435) Pulse Rate:  [54-84] 70 (05/13 0435) Resp:  [16-20] 18 (05/13 0435) BP: (115-166)/(47-64) 138/51 (05/13 0435) SpO2:  [91 %-100 %] 94 % (05/13 0435)  Intake/Output from previous day: 05/12 0701 - 05/13 0700 In: 2480.1 [P.O.:340; I.V.:2040.1; IV Piggyback:100] Out: 0923 [Urine:1625; Blood:50] Intake/Output this shift: No intake/output data recorded.  Recent Labs    03/14/18 0526 03/15/18 0508 03/16/18 0533  HGB 13.1 12.1 10.9*   Recent Labs    03/15/18 0508 03/16/18 0533  WBC 11.2* 11.4*  RBC 4.20 3.77*  HCT 36.0 32.2*  PLT 232 197   Recent Labs    03/15/18 0508 03/16/18 0533  NA 134* 131*  K 3.6 3.5  CL 99* 97*  CO2 22 24  BUN 20 17  CREATININE 0.88 0.88  GLUCOSE 253* 233*  CALCIUM 9.2 8.8*   Recent Labs    03/14/18 0526  INR 0.85    Neurologically intact Pelvis Portable  Result Date: 03/15/2018 CLINICAL DATA:  77 year old female with a history of left hip arthroplasty EXAM: PORTABLE PELVIS 1-2 VIEWS COMPARISON:  03/14/2018 FINDINGS: Bony pelvic ring appears intact with no acute fracture line identified. Surgical changes of the left hip with arthroplasty components and surgical staples. Gas within the soft tissues. No perihardware fracture. Mild right hip degenerative changes. IMPRESSION: Early surgical changes of left hip arthroplasty with no complicating features. Electronically Signed   By: Corrie Mckusick D.O.   On: 03/15/2018 09:22    Assessment/Plan: 1 Day Post-Op Procedure(s) (LRB): ARTHROPLASTY BIPOLAR HIP (HEMIARTHROPLASTY) (Left) Up with therapy, WBAT, d/c lovenox. Aspirin for DVT prophylaxis.  D/c foley.   Stefanie Vincent 03/16/2018, 8:16 AM

## 2018-03-16 NOTE — Discharge Instructions (Signed)
Posterior hip precautions.  Weight bearing with walker daily.  Take aspirin 325mg  one daily for 4 wks to help prevent blood clots. See Dr. Lorin Mercy in  2 wks.  OK to shower. Do not cross your legs.

## 2018-03-16 NOTE — Clinical Social Work Placement (Addendum)
[facility] arranged for transport.  Nurse given the number to call reports.  Both patient adult children informed d/c.  CLINICAL SOCIAL WORK PLACEMENT  NOTE  Date:  03/16/2018  Patient Details  Name: Stefanie Vincent MRN: [id number] Date of Birth: Mar 02, 1941  Clinical Social Work is seeking post-discharge placement for this patient at the [place] level of care (*CSW will initial, date and re-position this form in  chart as items are completed):      Patient/family provided with [facility] Work Department's list of facilities offering this level of care within the geographic area requested by the patient (or if unable, by the patient's family).      Patient/family informed of their freedom to choose among providers that offer the needed level of care, that participate in Medicare, Medicaid or managed care program needed by the patient, have an available bed and are willing to accept the patient.      Patient/family informed of 's ownership interest in [facility] and [facility], as well as of the fact that they are under no obligation to receive care at these facilities.  PASRR submitted to EDS on       PASRR number received on       Existing PASRR number confirmed on 03/16/18     FL2 transmitted to all facilities in geographic area requested by pt/family on       FL2 transmitted to all facilities within larger geographic area on 03/16/18     Patient informed that his/her managed care company has contracts with or will negotiate with certain facilities, including the following:            Patient/family informed of bed offers received.  Patient chooses bed at     [facility]  Physician recommends and patient chooses bed at    [facility].   Patient to be transferred to   on  .  Patient to be transferred to facility by     [facility]  Patient family notified on   of transfer. Son-Steven by phone. Daughter  [name] by text message.   Name of family member notified:     Son-Steven.   PHYSICIAN       Additional Comment:    _______________________________________________ Lia Hopping, LCSW 03/16/2018, 1:22 PM

## 2018-03-16 NOTE — Clinical Social Work Note (Signed)
Clinical Social Work Assessment  Patient Details  Name: Stefanie Vincent MRN: [id number] Date of Birth: September 10, 1941  Date of referral:  03/16/18               Reason for consult:  Facility Placement                Permission sought to share information with:  Family Supports Permission granted to share information::  Yes, Verbal Permission Granted  Name::        Agency::  SNF  Relationship::  Daughter/Son  Contact Information:  [address]   Housing/Transportation Living arrangements for the past 2 months:  [place] of Information:  Patient Patient Interpreter Needed:  None Criminal Activity/Legal Involvement Pertinent to Current Situation/Hospitalization:  No - Comment as needed Significant Relationships:  Adult Children Lives with:  Adult Children(Daughter) Do you feel safe going back to the place where you live?  Yes Need for family participation in patient care:  Yes (Comment)  Care giving concerns:   SNF placement for short rehab before returning home.   Social Worker assessment / plan:  Patient reports she was recently at [facility] about two months ago and complete physical therapy to regain her strength. Patient has a medical history for COPD admitted at the time for shortness of breath.   Patient admitted this time for a hip fracture after having a fall at home. She lives in the home with her daughter.   Patient reports she cannot for long periods of time"it hurts to stand." Patient reports before the fall she was independent  cleaning and cooking for herself. Patient reports her daughter drives and assist with her appointments.   Patient prefers to go back to [facility], if space is available at discharge.   Plan: SNF  Employment status:  Retired Forensic scientist:  Medicare PT Recommendations:  [place] / Referral to community resources:  [place]  Patient/Family's Response to care: Agreeable and  Responding to care.   Patient/Family's Understanding of and Emotional Response to Diagnosis, Current Treatment, and Prognosis:  Patient alert and oriented x4 and has a good understanding of her medical history. Patient reports her ddaughter is very involved in her care.  Emotional Assessment Appearance:  Appears stated age Attitude/Demeanor/Rapport:    Affect (typically observed):  Accepting Orientation:  Oriented to Self, Oriented to Place, Oriented to  Time, Oriented to Situation Alcohol / Substance use:  Not Applicable Psych involvement (Current and /or in the community):  No (Dependent with mobility)  Discharge Needs  Concerns to be addressed:  Discharge Planning Concerns Readmission within the last 30 days:  No Current discharge risk:  Dependent with Mobility Barriers to Discharge:  Continued Medical Work up   [employer], [place] 03/16/2018, 12:42 PM

## 2018-03-16 NOTE — Progress Notes (Signed)
OT Cancellation Note  Patient Details Name: ORTENCIA ASKARI MRN: [id number] DOB: 07/16/1941   Cancelled Treatment:    Reason Eval/Treat Not Completed: Other (comment)  Noted plans for SNF- will defer OT eval to SNF [facility] [place]  Betsy Pries 03/16/2018, 1:43 PM

## 2018-03-16 NOTE — NC FL2 (Signed)
[place] MEDICAID FL2 LEVEL OF CARE SCREENING TOOL     IDENTIFICATION  Patient Name: Stefanie Vincent Birthdate: 14-Jul-1941 Sex: female Admission Date (Current Location): 03/14/2018  [facility] and [place] Number:  Herbalist and Address:  [facility],  [place], [place]      Provider Number: [id number]  Attending Physician Name and Address:  Kerney Elbe, DO  Relative Name and Phone Number:       Current Level of Care: SNF Recommended Level of Care: [place] Prior Approval Number:    Date Approved/Denied:   PASRR Number: [id number]  Discharge Plan: SNF    Current Diagnoses: Patient Active Problem List   Diagnosis Date Noted  . Hip fracture ([place]) 03/14/2018  . Influenza A 12/26/2017  . CAP (community acquired pneumonia) 12/26/2017  . Acute respiratory failure with hypoxia ([place]) 12/26/2017  . Orthostatic hypotension 12/26/2017  . Hypokalemia 04/11/2015  . Near syncope 04/11/2015  . Abdominal pain 04/11/2015  . Hyponatremia 03/22/2015  . Loss of weight 03/22/2015  . COPD exacerbation ([place]) 03/12/2015  . Type 2 diabetes mellitus ([place]) 03/08/2015  . Essential hypertension 03/08/2015    Orientation RESPIRATION BLADDER Height & Weight     Self, Time, Place, Situation  Normal Continent Weight: 147 lb (66.7 kg) Height:  5\' 2"  (157.5 cm)  BEHAVIORAL SYMPTOMS/MOOD NEUROLOGICAL BOWEL NUTRITION STATUS      Continent Diet(Carb Modified. )  AMBULATORY STATUS COMMUNICATION OF NEEDS Skin   Extensive Assist Verbally Normal                       Personal Care Assistance Level of Assistance  Bathing, Feeding, Dressing Bathing Assistance: Limited assistance Feeding assistance: Independent Dressing Assistance: Limited assistance     Functional Limitations Info  Sight, Hearing, Speech Sight Info: Impaired Hearing Info: Impaired Speech Info: Adequate    SPECIAL CARE FACTORS FREQUENCY  PT (By licensed  PT), OT (By licensed OT)     PT Frequency: 5x/week OT Frequency: 5x/week            Contractures Contractures Info: Not present    Additional Factors Info  Code Status, Allergies Code Status Info: Fullcode Allergies Info: Allergies: Codeine           Current Medications (03/16/2018):  This is the current hospital active medication list Current Facility-Administered Medications  Medication Dose Route Frequency Provider Last Rate Last Dose  . 0.9 %  sodium chloride infusion   Intravenous Continuous Raiford Noble [place], DO 50 mL/hr at 03/16/18 1023    . albuterol (PROVENTIL) (2.5 MG/3ML) 0.083% nebulizer solution 2.5 mg  2.5 mg Nebulization Q6H PRN Marybelle Killings, MD      . aspirin EC tablet 325 mg  325 mg Oral Q breakfast Marybelle Killings, MD   325 mg at 03/16/18 0827  . atenolol (TENORMIN) tablet 50 mg  50 mg Oral Daily Marybelle Killings, MD   50 mg at 03/16/18 1024  . bisacodyl (DULCOLAX) suppository 10 mg  10 mg Rectal Daily PRN Marybelle Killings, MD      . cholecalciferol (VITAMIN D) tablet 400 Units  400 Units Oral Daily Raiford Noble [place], [place]   400 Units at 03/16/18 1024  . docusate sodium (COLACE) capsule 100 mg  100 mg Oral BID Marybelle Killings, MD   100 mg at 03/16/18 1024  . feeding supplement (ENSURE ENLIVE) (ENSURE ENLIVE) liquid 237 mL  237 mL Oral BID  BM Sheikh, Omair Latif, DO   237 mL at 03/16/18 1025  . HYDROcodone-acetaminophen (NORCO/VICODIN) 5-325 MG per tablet 1-2 tablet  1-2 tablet Oral Q6H PRN Marybelle Killings, MD   1 tablet at 03/16/18 1148  . insulin aspart (novoLOG) injection 0-9 Units  0-9 Units Subcutaneous TID WC Marybelle Killings, MD   7 Units at 03/16/18 1147  . insulin aspart (novoLOG) injection 3 Units  3 Units Subcutaneous TID WC Marybelle Killings, MD   3 Units at 03/16/18 0827  . losartan (COZAAR) tablet 25 mg  25 mg Oral Daily Marybelle Killings, MD   25 mg at 03/16/18 1024  . menthol-cetylpyridinium (CEPACOL) lozenge 3 mg  1 lozenge Oral PRN Marybelle Killings, MD       Or  .  phenol (CHLORASEPTIC) mouth spray 1 spray  1 spray Mouth/Throat PRN Marybelle Killings, MD      . methocarbamol (ROBAXIN) tablet 500 mg  500 mg Oral Q6H PRN Marybelle Killings, MD   500 mg at 03/16/18 1148  . metoCLOPramide (REGLAN) tablet 5-10 mg  5-10 mg Oral Q8H PRN Marybelle Killings, MD       Or  . metoCLOPramide (REGLAN) injection 5-10 mg  5-10 mg Intravenous Q8H PRN Marybelle Killings, MD      . morphine 2 MG/ML injection 0.5 mg  0.5 mg Intravenous Q2H PRN Marybelle Killings, MD      . ondansetron [facility]) tablet 4 mg  4 mg Oral Q6H PRN Marybelle Killings, MD       Or  . ondansetron [facility]) injection 4 mg  4 mg Intravenous Q6H PRN Marybelle Killings, MD      . pantoprazole (PROTONIX) EC tablet 40 mg  40 mg Oral Daily Sheikh, Georgina Quint [place], DO   40 mg at 03/16/18 1024  . polyethylene glycol (MIRALAX / GLYCOLAX) packet 17 g  17 g Oral Daily PRN Marybelle Killings, MD      . pravastatin (PRAVACHOL) tablet 40 mg  40 mg Oral QPC breakfast Raiford Noble [place], DO   40 mg at 03/16/18 0827  . tiotropium (SPIRIVA) inhalation capsule 18 mcg  18 mcg Inhalation Daily Marybelle Killings, MD   18 mcg at 03/16/18 1055     Discharge Medications: Please see discharge summary for a list of discharge medications.  Relevant Imaging Results:  Relevant Lab Results:   Additional Information ssn: [phone]  Lia Hopping, LCSW

## 2018-03-16 NOTE — Evaluation (Signed)
Physical Therapy Evaluation Patient Details Name: Stefanie Vincent MRN: [id number] DOB: 03-22-1941 Today's Date: 03/16/2018   History of Present Illness  77 year old female who lives here in [place] with her daughter with recent discharge from the hospital February 2019 with influenza and hypoxia;  Hx: DM, right foot TMA; Pt adm after mechanical fall at home  suffering a closed displaced femoral neck fracture, s/p L hip hemiarthroplasty  Clinical Impression  Pt admitted with above diagnosis. Pt currently with functional limitations due to the deficits listed below (see PT Problem List).  Pt will benefit from SNF, requiring max/total assist for bed mobility and transfers;  Pt will benefit from skilled PT to increase their independence and safety with mobility to allow discharge to the venue listed below.       Follow Up Recommendations SNF    Equipment Recommendations  None recommended by PT    Recommendations for Other Services       Precautions / Restrictions Precautions Precautions: Fall;Posterior Hip Required Braces or Orthoses: Knee Immobilizer - Left Restrictions Weight Bearing Restrictions: No LLE Weight Bearing: Weight bearing as tolerated      Mobility  Bed Mobility Overal bed mobility: Needs Assistance Bed Mobility: Sit to Supine       Sit to supine: +2 for safety/equipment;+2 for physical assistance;Total assist   General bed mobility comments: assist with LEs and trunk  Transfers Overall transfer level: Needs assistance   Transfers: Lateral/Scoot Transfers          Lateral/Scoot Transfers: +2 safety/equipment;+2 physical assistance;Max assist General transfer comment: pt in chair on arrival, requesting to go back to bed; attmepted standing, pt unable with +2 assist; bed pad used to laterally scoot  back to bed  Ambulation/Gait                Stairs            Wheelchair Mobility    Modified Rankin (Stroke Patients Only)        Balance Overall balance assessment: Needs assistance;History of Falls Sitting-balance support: Feet supported;Single extremity supported;Bilateral upper extremity supported Sitting balance-Leahy Scale: Fair                                       Pertinent Vitals/Pain Pain Assessment: Faces Faces Pain Scale: Hurts whole lot Pain Location: left hip Pain Descriptors / Indicators: Discomfort;Moaning;Grimacing;Guarding Pain Intervention(s): Monitored during session;Repositioned;Patient requesting pain meds-RN notified    Home Living Family/patient expects to be discharged to:: Skilled nursing facility                 Additional Comments: daughter works in the day    Prior Function                 Hand Dominance        Extremity/Trunk Assessment   Upper Extremity Assessment Upper Extremity Assessment: Defer to OT evaluation    Lower Extremity Assessment Lower Extremity Assessment: LLE deficits/detail LLE Deficits / Details: knee AAROM to 60*, AAROM ankle to neutral, testing limited by pain LLE: Unable to fully assess due to pain       Communication   Communication: No difficulties  Cognition Arousal/Alertness: Awake/alert Behavior During Therapy: WFL for tasks assessed/performed Overall Cognitive Status: Within Functional Limits for tasks assessed  General Comments      Exercises     Assessment/Plan    PT Assessment Patient needs continued PT services  PT Problem List Decreased activity tolerance;Decreased strength;Decreased range of motion;Decreased mobility;Decreased knowledge of use of DME;Decreased balance       PT Treatment Interventions DME instruction;Gait training;Functional mobility training;Therapeutic activities;Therapeutic exercise;Patient/family education    PT Goals (Current goals can be found in the Care Plan section)  Acute Rehab PT Goals Patient Stated Goal:  get better PT Goal Formulation: With patient Time For Goal Achievement: 03/30/18 Potential to Achieve Goals: Good    Frequency Min 3X/week   Barriers to discharge        Co-evaluation               AM-PAC PT "6 Clicks" Daily Activity  Outcome Measure Difficulty turning over in bed (including adjusting bedclothes, sheets and blankets)?: Unable Difficulty moving from lying on back to sitting on the side of the bed? : Unable Difficulty sitting down on and standing up from a chair with arms (e.g., wheelchair, bedside commode, etc,.)?: Unable Help needed moving to and from a bed to chair (including a wheelchair)?: Total Help needed walking in hospital room?: Total Help needed climbing 3-5 steps with a railing? : Total 6 Click Score: 6    End of Session Equipment Utilized During Treatment: Gait belt Activity Tolerance: Patient limited by pain Patient left: in bed;with call bell/phone within reach;with bed alarm set        Time: 1101-1125 PT Time Calculation (min) (ACUTE ONLY): 24 min   Charges:   PT Evaluation $PT Eval Low Complexity: 1 Low PT Treatments $Therapeutic Activity: 8-22 mins   PT G CodesKenyon Ana, PT Pager: [phone] 03/16/2018   [facility] 03/16/2018, 1:46 PM

## 2018-03-17 DIAGNOSIS — E871 Hypo-osmolality and hyponatremia: Secondary | ICD-10-CM

## 2018-03-17 DIAGNOSIS — Z794 Long term (current) use of insulin: Secondary | ICD-10-CM

## 2018-03-17 DIAGNOSIS — I10 Essential (primary) hypertension: Secondary | ICD-10-CM

## 2018-03-17 DIAGNOSIS — E785 Hyperlipidemia, unspecified: Secondary | ICD-10-CM

## 2018-03-17 DIAGNOSIS — E559 Vitamin D deficiency, unspecified: Secondary | ICD-10-CM

## 2018-03-17 DIAGNOSIS — J441 Chronic obstructive pulmonary disease with (acute) exacerbation: Secondary | ICD-10-CM

## 2018-03-17 DIAGNOSIS — E1165 Type 2 diabetes mellitus with hyperglycemia: Secondary | ICD-10-CM

## 2018-03-17 DIAGNOSIS — Z8673 Personal history of transient ischemic attack (TIA), and cerebral infarction without residual deficits: Secondary | ICD-10-CM

## 2018-03-17 DIAGNOSIS — D72829 Elevated white blood cell count, unspecified: Secondary | ICD-10-CM

## 2018-03-17 LAB — GLUCOSE, CAPILLARY
GLUCOSE-CAPILLARY: 291 mg/dL — AB (ref 65–99)
GLUCOSE-CAPILLARY: 269 mg/dL — AB (ref 65–99)
Glucose-Capillary: 272 mg/dL — ABNORMAL HIGH (ref 65–99)

## 2018-03-17 LAB — COMPREHENSIVE METABOLIC PANEL
ALBUMIN: 2.6 g/dL — AB (ref 3.5–5.0)
ALK PHOS: 64 U/L (ref 38–126)
ALT: 11 U/L — ABNORMAL LOW (ref 14–54)
ANION GAP: 10 (ref 5–15)
AST: 13 U/L — ABNORMAL LOW (ref 15–41)
BUN: 16 mg/dL (ref 6–20)
CALCIUM: 8.8 mg/dL — AB (ref 8.9–10.3)
CO2: 25 mmol/L (ref 22–32)
Chloride: 97 mmol/L — ABNORMAL LOW (ref 101–111)
Creatinine, Ser: 0.76 mg/dL (ref 0.44–1.00)
GFR calc Af Amer: >60 mL/min (ref >60)
GFR calc non Af Amer: >60 mL/min (ref >60)
Glucose, Bld: 263 mg/dL — ABNORMAL HIGH (ref 65–99)
POTASSIUM: 3.6 mmol/L (ref 3.5–5.1)
SODIUM: 132 mmol/L — AB (ref 135–145)
TOTAL PROTEIN: 6 g/dL — AB (ref 6.5–8.1)
Total Bilirubin: 0.5 mg/dL (ref 0.3–1.2)

## 2018-03-17 LAB — CBC WITH DIFFERENTIAL/PLATELET
BASOS PCT: 0 %
Basophils Absolute: 0 10*3/uL (ref 0.0–0.1)
EOS ABS: 0.2 10*3/uL (ref 0.0–0.7)
EOS PCT: 2 %
HCT: 30.1 % — ABNORMAL LOW (ref 36.0–46.0)
HEMOGLOBIN: 10.2 g/dL — AB (ref 12.0–15.0)
LYMPHS ABS: 1.8 10*3/uL (ref 0.7–4.0)
Lymphocytes Relative: 19 %
MCH: 28.7 pg (ref 26.0–34.0)
MCHC: 33.9 g/dL (ref 30.0–36.0)
MCV: 84.6 fL (ref 78.0–100.0)
MONO ABS: 0.8 10*3/uL (ref 0.1–1.0)
MONOS PCT: 9 %
NEUTROS PCT: 70 %
Neutro Abs: 6.6 10*3/uL (ref 1.7–7.7)
Platelets: 188 10*3/uL (ref 150–400)
RBC: 3.56 MIL/uL — ABNORMAL LOW (ref 3.87–5.11)
RDW: 13.7 % (ref 11.5–15.5)
WBC: 9.4 10*3/uL (ref 4.0–10.5)

## 2018-03-17 LAB — PHOSPHORUS: Phosphorus: 2.6 mg/dL (ref 2.5–4.6)

## 2018-03-17 LAB — MAGNESIUM: Magnesium: 1.4 mg/dL — ABNORMAL LOW (ref 1.7–2.4)

## 2018-03-17 MED ORDER — ONDANSETRON HCL 4 MG PO TABS: 4.0000 mg | ORAL_TABLET | Freq: Four times a day (QID) | ORAL | 0 refills | Status: AC | PRN

## 2018-03-17 MED ORDER — ENSURE ENLIVE PO LIQD: 237.0000 mL | Freq: Two times a day (BID) | ORAL | 12 refills | Status: AC

## 2018-03-17 MED ORDER — MAGNESIUM SULFATE 2 GM/50ML IV SOLN: 2.0000 g | Freq: Once | INTRAVENOUS | Status: DC

## 2018-03-17 MED ORDER — POLYETHYLENE GLYCOL 3350 17 G PO PACK
17.0000 g | PACK | Freq: Two times a day (BID) | ORAL | Status: DC
  Administered 2018-03-17: 17 g via ORAL
  Filled 2018-03-17: qty 1

## 2018-03-17 MED ORDER — MAGNESIUM SULFATE 50 % IJ SOLN
3.0000 g | Freq: Once | INTRAVENOUS | Status: AC
  Administered 2018-03-17: 3 g via INTRAVENOUS
  Filled 2018-03-17: qty 6

## 2018-03-17 MED ORDER — DOCUSATE SODIUM 100 MG PO CAPS: 100.0000 mg | ORAL_CAPSULE | Freq: Two times a day (BID) | ORAL | 0 refills | Status: AC

## 2018-03-17 NOTE — Care Management Important Message (Signed)
Important Message  Patient Details  Name: Stefanie Vincent MRN: [id number] Date of Birth: 01-17-1941   Medicare Important Message Given:  Yes    [name] 03/17/2018, 11:54 AMImportant Message  Patient Details  Name: Stefanie Vincent MRN: [id number] Date of Birth: 06-17-1941   Medicare Important Message Given:  Yes    [name] 03/17/2018, 11:54 AM

## 2018-03-17 NOTE — Progress Notes (Addendum)
Patient first facility choice does not have bed availability at this time. CSW discussed  other SNF options with the patient. She is not familiar other SNF's in the area. She prefers her daughter or son make the choice. CSW has placed calls to both adult children. CSW will continue to call.   Update: Patient first facility choice-[facility] able to make bed offer today. CSW still unable to reach patient daughter or son.  The facility liaison will complete paperwork with the patient. Patient will transport by [facility].   D/C summary and FL2 sent.

## 2018-03-17 NOTE — Discharge Summary (Signed)
Physician Discharge Summary  Stefanie Vincent QJF:[id number] DOB: 05-Jun-1941 DOA: 03/14/2018  PCP: Helane Rima, MD  Admit date: 03/14/2018 Discharge date: 03/17/2018  Admitted From: Home Disposition: SNF  Recommendations for Outpatient Follow-up:  1. Follow up with PCP in 1-2 weeks 2. Follow up with Orthopedic Surgery Dr. Lorin Mercy in 1-2 weeks 3. WBAT 4. Please obtain CMP/CBC, Mag, Phos in one week 5. 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  Came to the hospital secondary to severe pain in Left Hip after mechanical fall. Found to have a Displaced Left femoral Neck Fracture and was taken to the OR for a Left Hip Bipolar Hemiarthroplasty 03/15/18.  She is postoperative day 2 and is improved.  She is weightbearing as tolerated physical therapy and Occupational Therapy have evaluated and recommending skilled nursing facility.  Social worker consulted for assistance placement.  Patient be medically stable to be discharged to skilled nursing facility and will need to follow-up with primary care physician as well as Orthopedic Surgery as an outpatient.  Discharge Diagnoses:  Active Problems:   Hip fracture (HCC)  Left Hip Fracture s/p Left Hip Bipolar Hemiarthoplasty POD 2 -Left Hip X-Ray showed Acute fracture of left femoral neck with proximal migration of  femoral shaft and coxa vara deformity. -Placed in Buck's traction 3 pounds until Orthopedics can see -Orthopedic surgery evaluated and took the patient to the OR for a left hip bipolar hemiarthroplasty done on 03/15/2018 by Dr. Lorin Mercy -Pain control with p.o. Hydrocodone-Acetaminophen 1 to 2 tablets every 6 hours as needed for moderate pain, as well as IV morphine 2.5 mg every 2 as needed for severe pain while hospitalized; Orthopedics writing for pain control at discharge -Continue with Robaxin 500 mg p.o. every 6 as needed for muscle spasms -Continue with Antiemetics with Zofran 4 mg p.o./IV every 6 hours as needed for nausea and with Metoclopramide 5 to 10 mg p.o./IV every 8 hours as needed for nausea if Zofran is not effective while hospitalized  -Bowel regimen with Bisacodyl suppository 10 mg rectally daily as needed for moderate constipation, Docusate Sodium 100 mg p.o. twice daily, and MiraLAX 17 g p.o. daily as needed for moderate constipation; Continue bowel regimen at SNF -IVF Rehydration with Lactated Ringer's at 50 mL/hr now stopped and started on NS at 50 mL/hr but will also stop to prevent volume overload now that patient is eating.  -Surgical prophylactic antibiotics with Cefazolin per orthopedic surgery -DVT prophylaxis with Enoxaparin 30 mg subcu every 24 hours changed to ASA 325 mg po with Breakfast per Ortho -WBAT and Up with Therapy per Ortho; Foley Catheter removed yesterday per Ortho  -PT/OT to evaluate recommending SNF and patient is stable to be discharged today. -Follow-up with PCP as well as orthopedics as an outpatient  Cor Pulmonale, Diastolic Heart Failure -Probably secondary to smoking, OHS, and Obese Habitus -Continue current medications with Losartan 25 mg daily and Atenolol 50 mg daily  -Gentle IV  fluid hydration with Lactated Ringer at 50 mL's per hour as above now Stopped  -Continue to monitor Strict I's and O's, Daily Weights; she is +126.8 mL's since  admission -Weight was 147 pounds and not rechecked -Continue to monitor volume status extremely carefully at skilled nursing facility  Insulin Dependent Diabetes Mellitus Type 2 -Takes only 2 units of 70/30 insulin with meals and will resume at discharge -Continue with Sensitive Novolog SSI AC and Novolog 3 units sq TIDwm -CBGs ranging from 269-342 -Resume home insulin at discharge  Tobacco Abuse/Smoker -Smoking Cessation Counseling Given -C/w Nicotine Patch 14 mg TD  COPD -Currently not in Exacerbation -Will continue currently Tiotropium 18 mcg daily and Albuterol 2.5 mg Neb q6hprn Wheezing/SOB as needed  HTN -Continue with Losartan 25 mg p.o. daily  HLD -Continue with Pravastatin 40 mg po with breakfast  Vitamin D Deficiency -Continue with Vitamin D 400 units p.o. daily  Hx of Lacunar CVA -C/w ASA 325 mg po Daily (Increased from 81 mg to 325 mg po Daily by Ortho) and with Pravastatin 40 mg po with breakfast -Per Neurology Clinic there is no need for Plavix at this time -Follow up with Neurology as an outpatient as necessary   Leukocytosis, improved  -Mild. Patient's WBC was 11.7 on admission is now 9.4 -Likely secondary to pain and Surgery  -Continue to monitor for signs and symptoms of infection -Repeat CBC at SNF  Hyponatremia, improving slowly  -Has a history of Hyponatremia and previously was on Chlorthalidone -Mild at 134 and dropped to 131; Na+ is now 132 -Started Gentle IVF Rehydration with NS this AM after LR finished yesterday and now will stop this afternoon in avoidance of Volume Overload; Given Bolus Yesterday -Continue to monitor and repeat CMP at SNF  Hypomagnesemia -Patient's magnesium level was 1.4 this morning -Replete with IV mag sulfate 3 g -Continue to monitor and replete as necessary -Repeat magnesium level at skilled nursing facility  Normocytic Anemia/ ABLA -Expected postoperative drop -Patient's hemoglobin/hematocrit went from  12.1/30 6 -> 10.9/32.2 and is now 10.2/30.1 -Continue to monitor for signs and symptoms of bleeding the patient on Aspirin 325 daily -Repeat CBC at SNF  Discharge Instructions  Discharge Instructions    Call MD for:  difficulty breathing, headache or visual disturbances   Complete by:  As directed    Call MD for:  extreme fatigue   Complete by:  As directed    Call MD for:  hives   Complete by:  As directed    Call MD for:  persistant dizziness or light-headedness   Complete by:  As directed    Call MD for:  persistant nausea and vomiting   Complete by:  As directed    Call MD for:  redness, tenderness, or signs of infection (pain, swelling, redness, odor or green/yellow discharge around incision site)   Complete by:  As directed    Call MD for:  severe uncontrolled pain   Complete by:  As directed    Call MD for:  temperature >100.4   Complete by:  As directed    Diet - low sodium heart healthy   Complete by:  As directed    Discharge instructions   Complete by:  As directed    Follow up with PCP and Orthopedic Surgery within 1-2 weeks.  Take all medications as prescribed.  If symptoms change or worsen please return the emergency room for evaluation.   Increase activity slowly   Complete by:  As directed  Weight bearing as tolerated   Complete by:  As directed    With posterior hip precautions left hip   Laterality:  left     Allergies as of 03/17/2018      Reactions   Codeine    Makes patient depressed.      Medication List    STOP taking these medications   aspirin 81 MG chewable tablet Replaced by:  aspirin 325 MG EC tablet   cholestyramine 4 g packet Commonly known as:  QUESTRAN   fluticasone 50 MCG/ACT nasal spray Commonly known as:  FLONASE     TAKE these medications   albuterol (2.5 MG/3ML) 0.083% nebulizer solution Commonly known as:  PROVENTIL Take 3 mLs (2.5 mg total) by nebulization every 6 (six) hours as needed for wheezing or shortness of  breath.   aspirin 325 MG EC tablet Take 1 tablet (325 mg total) by mouth daily with breakfast. Replaces:  aspirin 81 MG chewable tablet   atenolol 50 MG tablet Commonly known as:  TENORMIN Take 1 tablet (50 mg total) by mouth daily.   docusate sodium 100 MG capsule Commonly known as:  COLACE Take 1 capsule (100 mg total) by mouth 2 (two) times daily.   feeding supplement (ENSURE ENLIVE) Liqd Take 237 mLs by mouth 2 (two) times daily between meals.   HYDROcodone-acetaminophen 5-325 MG tablet Commonly known as:  NORCO/VICODIN Take 1-2 tablets by mouth every 6 (six) hours as needed for moderate pain.   insulin aspart protamine- aspart (70-30) 100 UNIT/ML injection Commonly known as:  NOVOLOG MIX 70/30 Inject 0.1 mLs (10 Units total) into the skin 2 (two) times daily with a meal. What changed:    how much to take  additional instructions   loratadine 10 MG tablet Commonly known as:  CLARITIN Take 10 mg by mouth daily after breakfast.   losartan 25 MG tablet Commonly known as:  COZAAR Take 1 tablet (25 mg total) by mouth daily.   omeprazole 20 MG capsule Commonly known as:  PRILOSEC Take 20 mg by mouth daily after breakfast.   ondansetron 4 MG tablet Commonly known as:  ZOFRAN Take 1 tablet (4 mg total) by mouth every 6 (six) hours as needed for nausea.   polyethylene glycol packet Commonly known as:  MIRALAX / GLYCOLAX Take 17 g by mouth daily as needed for moderate constipation.   pravastatin 40 MG tablet Commonly known as:  PRAVACHOL Take 40 mg by mouth daily after breakfast.   tiotropium 18 MCG inhalation capsule Commonly known as:  SPIRIVA Place 1 capsule (18 mcg total) into inhaler and inhale daily.   vitamin D (CHOLECALCIFEROL) 400 units tablet Take 400 Units by mouth daily.            Discharge Care Instructions  (From admission, onward)        Start     Ordered   03/16/18 0000  Weight bearing as tolerated    Comments:  With posterior hip  precautions left hip  Question:  Laterality  Answer:  left   03/16/18 0846     Follow-up Information    Marybelle Killings, MD Follow up in 2 week(s).   Specialty:  Orthopedic Surgery Contact information: [address] [place] [address] [phone]          Allergies  Allergen Reactions  . Codeine     Makes patient depressed.    Consultations:  Orthopedic Surgery  Procedures/Studies: Ct Head Wo Contrast  Result Date: 03/14/2018  CLINICAL DATA:  77 year old female with fall. EXAM: CT HEAD WITHOUT CONTRAST CT CERVICAL SPINE WITHOUT CONTRAST TECHNIQUE: Multidetector CT imaging of the head and cervical spine was performed following the standard protocol without intravenous contrast. Multiplanar CT image reconstructions of the cervical spine were also generated. COMPARISON:  None. FINDINGS: CT HEAD FINDINGS Brain: There is mild age-related atrophy and moderate chronic microvascular ischemic changes. There is no acute intracranial hemorrhage. No mass effect or midline shift. No extra-axial fluid collection. Vascular: No hyperdense vessel or unexpected calcification. Skull: Normal. Negative for fracture or focal lesion. Sinuses/Orbits: No acute finding. Other: None CT CERVICAL SPINE FINDINGS Alignment: No acute subluxation. Skull base and vertebrae: No acute fracture.  Osteopenia. Soft tissues and spinal canal: No prevertebral fluid or swelling. No visible canal hematoma. Disc levels: Multilevel degenerative changes most prominent at C3-C4 and C5-C6. Upper chest: Mild interstitial coarsening and interlobular septal prominence may represent a degree of interstitial edema. Clinical correlation is recommended. There is atherosclerotic calcification of the aortic arch. Bilateral carotid bulb calcified plaques noted. Other: None IMPRESSION: 1. No acute intracranial hemorrhage. Age-related atrophy and chronic microvascular ischemic changes. 2. No acute/traumatic cervical spine pathology.  Osteopenia with multilevel degenerative changes. 3. Bilateral carotid bulb calcified plaques. Electronically Signed   By: Anner Crete M.D.   On: 03/14/2018 06:46   Ct Cervical Spine Wo Contrast  Result Date: 03/14/2018 CLINICAL DATA:  77 year old female with fall. EXAM: CT HEAD WITHOUT CONTRAST CT CERVICAL SPINE WITHOUT CONTRAST TECHNIQUE: Multidetector CT imaging of the head and cervical spine was performed following the standard protocol without intravenous contrast. Multiplanar CT image reconstructions of the cervical spine were also generated. COMPARISON:  None. FINDINGS: CT HEAD FINDINGS Brain: There is mild age-related atrophy and moderate chronic microvascular ischemic changes. There is no acute intracranial hemorrhage. No mass effect or midline shift. No extra-axial fluid collection. Vascular: No hyperdense vessel or unexpected calcification. Skull: Normal. Negative for fracture or focal lesion. Sinuses/Orbits: No acute finding. Other: None CT CERVICAL SPINE FINDINGS Alignment: No acute subluxation. Skull base and vertebrae: No acute fracture.  Osteopenia. Soft tissues and spinal canal: No prevertebral fluid or swelling. No visible canal hematoma. Disc levels: Multilevel degenerative changes most prominent at C3-C4 and C5-C6. Upper chest: Mild interstitial coarsening and interlobular septal prominence may represent a degree of interstitial edema. Clinical correlation is recommended. There is atherosclerotic calcification of the aortic arch. Bilateral carotid bulb calcified plaques noted. Other: None IMPRESSION: 1. No acute intracranial hemorrhage. Age-related atrophy and chronic microvascular ischemic changes. 2. No acute/traumatic cervical spine pathology. Osteopenia with multilevel degenerative changes. 3. Bilateral carotid bulb calcified plaques. Electronically Signed   By: Anner Crete M.D.   On: 03/14/2018 06:46   Pelvis Portable  Result Date: 03/15/2018 CLINICAL DATA:  77 year old  female with a history of left hip arthroplasty EXAM: PORTABLE PELVIS 1-2 VIEWS COMPARISON:  03/14/2018 FINDINGS: Bony pelvic ring appears intact with no acute fracture line identified. Surgical changes of the left hip with arthroplasty components and surgical staples. Gas within the soft tissues. No perihardware fracture. Mild right hip degenerative changes. IMPRESSION: Early surgical changes of left hip arthroplasty with no complicating features. Electronically Signed   By: Corrie Mckusick D.O.   On: 03/15/2018 09:22   Dg Chest Port 1 View  Result Date: 03/14/2018 CLINICAL DATA:  Hip fracture.  Preoperative study. EXAM: PORTABLE CHEST 1 VIEW COMPARISON:  December 27, 2017 FINDINGS: The heart size and mediastinal contours are within normal limits. Both lungs are clear. The visualized  skeletal structures are unremarkable. IMPRESSION: No active disease. Electronically Signed   By: Dorise Bullion III M.D   On: 03/14/2018 08:20   Dg Hip Unilat With Pelvis 2-3 Views Left  Result Date: 03/14/2018 CLINICAL DATA:  77 y/o F; left buttocks and hip pain. Unable to bear weight on the left hip. EXAM: DG HIP (WITH OR WITHOUT PELVIS) 2-3V LEFT COMPARISON:  None. FINDINGS: Acute fracture of the left femoral neck with proximal migration of the femoral shaft and coxa vara deformity. No pelvic fracture or diastasis. Vascular calcifications noted. IMPRESSION: Acute fracture of left femoral neck with proximal migration of femoral shaft and coxa vara deformity. Electronically Signed   By: Kristine Garbe M.D.   On: 03/14/2018 06:38     Subjective: Seen and examined at bedside and was doing well.  Still complained of pain whenever she tries to move the leg.  No chest pain, shortness breath, nausea, vomiting.  Other concerns or complaints at this time is ready to go to SNF  Discharge Exam: Vitals:   03/17/18 0830 03/17/18 0831  BP:    Pulse:    Resp:    Temp:    SpO2: 94% 94%   Vitals:   03/16/18 2025  03/17/18 0455 03/17/18 0830 03/17/18 0831  BP: (!) 148/49 (!) 151/49    Pulse: 66 64    Resp: 18 20    Temp: 98.3 F (36.8 C) 98.3 F (36.8 C)    TempSrc: Oral Oral    SpO2: 93% 98% 94% 94%  Weight:      Height:       General: Pt is alert, awake, not in acute distress Cardiovascular: RRR, S1/S2 +, no rubs, no gallops Respiratory: Diminished to auscultation bilaterally, no wheezing, no rhonchi Abdominal: Soft, NT, Slightly distended due to body habitus, bowel sounds + Extremities: no edema, no cyanosis; Left Left incisions appear C/D/I  The results of significant diagnostics from this hospitalization (including imaging, microbiology, ancillary and laboratory) are listed below for reference.    Microbiology: Recent Results (from the past 240 hour(s))  Surgical PCR screen     Status: None   Collection Time: 03/14/18  5:15 PM  Result Value Ref Range Status   MRSA, PCR NEGATIVE NEGATIVE Final   Staphylococcus aureus NEGATIVE NEGATIVE Final    Comment: (NOTE) The Xpert SA Assay (FDA approved for NASAL specimens in patients 8 years of age and older), is one component of a comprehensive surveillance program. It is not intended to diagnose infection nor to guide or monitor treatment. Performed at [facility], [place] [address], [place], [place] [address]     Labs: BNP (last 3 results) Recent Labs    12/27/17 1655  BNP 151.7*   Basic Metabolic Panel: Recent Labs  Lab 03/14/18 0526 03/15/18 0508 03/16/18 0533 03/17/18 0516  NA 136 134* 131* 132*  K 3.5 3.6 3.5 3.6  CL 101 99* 97* 97*  CO2 20* 22 24 25   GLUCOSE 266* 253* 233* 263*  BUN 13 20 17 16   CREATININE 0.98 0.88 0.88 0.76  CALCIUM 9.8 9.2 8.8* 8.8*  MG  --   --   --  1.4*  PHOS  --   --   --  2.6   Liver Function Tests: Recent Labs  Lab 03/17/18 0516  AST 13*  ALT 11*  ALKPHOS 64  BILITOT 0.5  PROT 6.0*  ALBUMIN 2.6*   No results for input(s): LIPASE, AMYLASE in the last 168  hours.  No results for input(s): AMMONIA in the last 168 hours. CBC: Recent Labs  Lab 03/14/18 0526 03/15/18 0508 03/16/18 0533 03/17/18 0516  WBC 11.7* 11.2* 11.4* 9.4  NEUTROABS 8.9*  --   --  6.6  HGB 13.1 12.1 10.9* 10.2*  HCT 38.5 36.0 32.2* 30.1*  MCV 84.2 85.7 85.4 84.6  PLT 270 232 197 188   Cardiac Enzymes: No results for input(s): CKTOTAL, CKMB, CKMBINDEX, TROPONINI in the last 168 hours. BNP: Invalid input(s): POCBNP CBG: Recent Labs  Lab 03/16/18 0740 03/16/18 1145 03/16/18 1625 03/16/18 2213 03/17/18 0744  GLUCAP 238* 301* 342* 291* 269*   D-Dimer No results for input(s): DDIMER in the last 72 hours. Hgb A1c No results for input(s): HGBA1C in the last 72 hours. Lipid Profile No results for input(s): CHOL, HDL, LDLCALC, TRIG, CHOLHDL, LDLDIRECT in the last 72 hours. Thyroid function studies No results for input(s): TSH, T4TOTAL, T3FREE, THYROIDAB in the last 72 hours.  Invalid input(s): FREET3 Anemia work up No results for input(s): VITAMINB12, FOLATE, FERRITIN, TIBC, IRON, RETICCTPCT in the last 72 hours. Urinalysis    Component Value Date/Time   COLORURINE YELLOW 12/26/2017 0935   APPEARANCEUR CLEAR 12/26/2017 0935   LABSPEC 1.025 12/26/2017 0935   PHURINE 5.0 12/26/2017 0935   GLUCOSEU 150 (A) 12/26/2017 0935   HGBUR NEGATIVE 12/26/2017 0935   BILIRUBINUR NEGATIVE 12/26/2017 0935   KETONESUR NEGATIVE 12/26/2017 0935   PROTEINUR 100 (A) 12/26/2017 0935   UROBILINOGEN 0.2 04/11/2015 0210   NITRITE NEGATIVE 12/26/2017 0935   LEUKOCYTESUR NEGATIVE 12/26/2017 0935   Sepsis Labs Invalid input(s): PROCALCITONIN,  WBC,  LACTICIDVEN Microbiology Recent Results (from the past 240 hour(s))  Surgical PCR screen     Status: None   Collection Time: 03/14/18  5:15 PM  Result Value Ref Range Status   MRSA, PCR NEGATIVE NEGATIVE Final   Staphylococcus aureus NEGATIVE NEGATIVE Final    Comment: (NOTE) The Xpert SA Assay (FDA approved for NASAL specimens  in patients 48 years of age and older), is one component of a comprehensive surveillance program. It is not intended to diagnose infection nor to guide or monitor treatment. Performed at [facility], [place] [address] [place], [place] [address]    Time coordinating discharge: 35 minutes  SIGNED:  Kerney Elbe, DO [facility] 03/17/2018, 11:51 AM Pager [phone]  If 7PM-7AM, please contact night-coverage [email or web address] Password [email or web address]

## 2018-03-17 NOTE — Progress Notes (Signed)
Physical Therapy Treatment Patient Details Name: Stefanie Vincent MRN: [id number] DOB: [birth year] Today's Date: 03/17/2018    History of Present Illness 77 year old female who lives here in [place] with her daughter with recent discharge from the hospital February 2019 with influenza and hypoxia;  Hx: DM, right foot TMA; Pt adm after mechanical fall at home  suffering a closed displaced femoral neck fracture, s/p L hip hemiarthroplasty    PT Comments    Pt progressing, cooperative although pain limiting mobility; continue to recommend SNF  Follow Up Recommendations  SNF     Equipment Recommendations  None recommended by PT    Recommendations for Other Services       Precautions / Restrictions Precautions Precautions: Fall;Posterior Hip Required Braces or Orthoses: Knee Immobilizer - Left Restrictions Weight Bearing Restrictions: No LLE Weight Bearing: Weight bearing as tolerated    Mobility  Bed Mobility   Bed Mobility: Supine to Sit     Supine to sit: +2 for physical assistance;+2 for safety/equipment;Max assist     General bed mobility comments: assist with LEs and trunk  Transfers Overall transfer level: Needs assistance Equipment used: Rolling walker (2 wheeled) Transfers: Sit to/from [employer] Transfers Sit to Stand: +2 safety/equipment;+2 physical assistance;Max assist Stand pivot transfers: +2 physical assistance;+2 safety/equipment;Max assist       General transfer comment: +2 to rise, stabilize, transition hands to walker, multi-modal cues and  assist for trunk extension, hip extension; pain limiting movement  Ambulation/Gait             General Gait Details: unable    Stairs             Wheelchair Mobility    Modified Rankin (Stroke Patients Only)       Balance   Sitting-balance support: Feet supported;Single extremity supported;Bilateral upper extremity supported Sitting balance-Leahy Scale: Fair                                      Cognition Arousal/Alertness: Awake/alert Behavior During Therapy: WFL for tasks assessed/performed Overall Cognitive Status: Within Functional Limits for tasks assessed                                        Exercises      General Comments        Pertinent Vitals/Pain Pain Assessment: Faces Faces Pain Scale: Hurts whole lot Pain Location: left hip Pain Descriptors / Indicators: Discomfort;Moaning;Grimacing;Guarding Pain Intervention(s): Monitored during session;Premedicated before session;Repositioned    Home Living                      Prior Function            PT Goals (current goals can now be found in the care plan section) Acute Rehab PT Goals Patient Stated Goal: get better PT Goal Formulation: With patient Time For Goal Achievement: 03/30/18 Potential to Achieve Goals: Good Progress towards PT goals: Progressing toward goals    Frequency    Min 3X/week      PT Plan Current plan remains appropriate    Co-evaluation              AM-PAC PT "6 Clicks" Daily Activity  Outcome Measure  Difficulty turning over in bed (including adjusting bedclothes, sheets and blankets)?: Unable Difficulty moving from  lying on back to sitting on the side of the bed? : Unable Difficulty sitting down on and standing up from a chair with arms (e.g., wheelchair, bedside commode, etc,.)?: Unable Help needed moving to and from a bed to chair (including a wheelchair)?: Total Help needed walking in hospital room?: Total Help needed climbing 3-5 steps with a railing? : Total 6 Click Score: 6    End of Session Equipment Utilized During Treatment: Gait belt Activity Tolerance: Patient limited by pain Patient left: in chair;with call bell/phone within reach;with chair alarm set Nurse Communication: Mobility status PT Visit Diagnosis: Muscle weakness (generalized) (M62.81);Other abnormalities of gait and mobility  (R26.89);History of falling (Z91.81)     Time: 8032-1224 PT Time Calculation (min) (ACUTE ONLY): 35 min  Charges:  $Therapeutic Activity: 23-37 mins                    G CodesKenyon Ana, PT Pager: [phone] 03/17/2018    Kenyon Ana 03/17/2018, 1:13 PM

## 2018-03-17 NOTE — Progress Notes (Signed)
   03/17/18 1500  PT Visit Information  Last PT Received On 03/17/18--pt will benefit from SNF  Assistance Needed +2  History of Present Illness 77 year old female who lives here in [place] with her daughter with recent discharge from the hospital February 2019 with influenza and hypoxia;  Hx: DM, right foot TMA; Pt adm after mechanical fall at home  suffering a closed displaced femoral neck fracture, s/p L hip hemiarthroplasty  Subjective Data  Patient Stated Goal get better  Precautions  Precautions Fall;Posterior Hip  Required Braces or Orthoses Knee Immobilizer - Left  Restrictions  Weight Bearing Restrictions No  LLE Weight Bearing WBAT  Pain Assessment  Pain Assessment Faces  Faces Pain Scale 10  Pain Location left hip  Pain Descriptors / Indicators Discomfort;Moaning;Grimacing;Guarding  Pain Intervention(s) Monitored during session  Cognition  Arousal/Alertness Awake/alert  Behavior During Therapy WFL for tasks assessed/performed  Overall Cognitive Status Within Functional Limits for tasks assessed  Bed Mobility  Overal bed mobility Needs Assistance  Bed Mobility Sit to Supine  Sit to supine +2 for safety/equipment;+2 for physical assistance;Total assist  General bed mobility comments assist with LEs and trunk  Transfers  Overall transfer level Needs assistance  Transfers Lateral/Scoot Transfers   Lateral/Scoot Transfers +2 safety/equipment;+2 physical assistance;Max assist  General transfer comment bed pad used to assist lateral scoot back to bed  Ambulation/Gait  General Gait Details unable   Balance  Sitting balance-Leahy Scale Poor  PT - End of Session  Equipment Utilized During Treatment Gait belt  Activity Tolerance Patient limited by pain  Patient left in bed;with call bell/phone within reach;with bed alarm set  Nurse Communication Mobility status   PT - Assessment/Plan  PT Plan Current plan remains appropriate  PT Visit Diagnosis Muscle weakness  (generalized) (M62.81);Other abnormalities of gait and mobility (R26.89);History of falling (Z91.81)  PT Frequency (ACUTE ONLY) Min 3X/week  Follow Up Recommendations SNF  PT equipment None recommended by PT  AM-PAC PT "6 Clicks" Daily Activity Outcome Measure  Difficulty turning over in bed (including adjusting bedclothes, sheets and blankets)? 1  Difficulty moving from lying on back to sitting on the side of the bed?  1  Difficulty sitting down on and standing up from a chair with arms (e.g., wheelchair, bedside commode, etc,.)? 1  Help needed moving to and from a bed to chair (including a wheelchair)? 1  Help needed walking in hospital room? 1  Help needed climbing 3-5 steps with a railing?  1  6 Click Score 6  Mobility G Code  CN  PT Goal Progression  Progress towards PT goals Progressing toward goals  Acute Rehab PT Goals  PT Goal Formulation With patient  Time For Goal Achievement 03/30/18  Potential to Achieve Goals Good  PT Time Calculation  PT Start Time (ACUTE ONLY) 1449  PT Stop Time (ACUTE ONLY) 1506  PT Time Calculation (min) (ACUTE ONLY) 17 min  PT General Charges  $$ ACUTE PT VISIT 1 Visit  PT Treatments  $Therapeutic Activity 8-22 mins

## 2018-03-17 NOTE — Progress Notes (Signed)
   Subjective: 2 Days Post-Op Procedure(s) (LRB): ARTHROPLASTY BIPOLAR HIP (HEMIARTHROPLASTY) (Left) Patient reports pain as moderate.  Only made it to recliner yesterday. " my hip is sore when I move".  Objective: Vital signs in last 24 hours: Temp:  [97.4 F (36.3 C)-98.7 F (37.1 C)] 98.3 F (36.8 C) (05/14 0455) Pulse Rate:  [63-66] 64 (05/14 0455) Resp:  [12-20] 20 (05/14 0455) BP: (135-151)/(42-49) 151/49 (05/14 0455) SpO2:  [93 %-99 %] 94 % (05/14 0831)  Intake/Output from previous day: 05/13 0701 - 05/14 0700 In: 895.8 [P.O.:240; I.V.:405.8; IV Piggyback:250] Out: 1250 [Urine:1250] Intake/Output this shift: No intake/output data recorded.  Recent Labs    03/15/18 0508 03/16/18 0533 03/17/18 0516  HGB 12.1 10.9* 10.2*   Recent Labs    03/16/18 0533 03/17/18 0516  WBC 11.4* 9.4  RBC 3.77* 3.56*  HCT 32.2* 30.1*  PLT 197 188   Recent Labs    03/16/18 0533 03/17/18 0516  NA 131* 132*  K 3.5 3.6  CL 97* 97*  CO2 24 25  BUN 17 16  CREATININE 0.88 0.76  GLUCOSE 233* 263*  CALCIUM 8.8* 8.8*   No results for input(s): LABPT, INR in the last 72 hours.  Neurologically intact No results found.  Assessment/Plan: 2 Days Post-Op Procedure(s) (LRB): ARTHROPLASTY BIPOLAR HIP (HEMIARTHROPLASTY) (Left) Up with therapy, SNF.   WBAT.   Stefanie Vincent 03/17/2018, 11:16 AM

## 2018-04-01 ENCOUNTER — Inpatient Hospital Stay (INDEPENDENT_AMBULATORY_CARE_PROVIDER_SITE_OTHER): Payer: Medicare Other | Admitting: Surgery

## 2018-04-07 ENCOUNTER — Telehealth (INDEPENDENT_AMBULATORY_CARE_PROVIDER_SITE_OTHER): Payer: Self-pay | Admitting: Orthopaedic Surgery

## 2018-04-07 NOTE — Telephone Encounter (Signed)
error 

## 2018-04-08 ENCOUNTER — Ambulatory Visit (INDEPENDENT_AMBULATORY_CARE_PROVIDER_SITE_OTHER): Payer: Medicare Other

## 2018-04-08 ENCOUNTER — Encounter (INDEPENDENT_AMBULATORY_CARE_PROVIDER_SITE_OTHER): Payer: Self-pay | Admitting: Orthopaedic Surgery

## 2018-04-08 ENCOUNTER — Ambulatory Visit (INDEPENDENT_AMBULATORY_CARE_PROVIDER_SITE_OTHER): Payer: Medicare Other | Admitting: Orthopaedic Surgery

## 2018-04-08 VITALS — BP 186/72 | HR 58 | Ht 62.0 in | Wt 138.0 lb

## 2018-04-08 DIAGNOSIS — Z96642 Presence of left artificial hip joint: Secondary | ICD-10-CM

## 2018-04-08 DIAGNOSIS — Z96649 Presence of unspecified artificial hip joint: Secondary | ICD-10-CM

## 2018-04-08 NOTE — Progress Notes (Signed)
   Post-Op Visit Note   Patient: Stefanie Vincent           Date of Birth: Jan 14, 1941           MRN: [id number] Visit Date: 04/08/2018 PCP: Helane Rima, MD   Assessment & Plan:  Chief Complaint:  Chief Complaint  Patient presents with  . Left Hip - Routine Post Op   Visit Diagnoses:  1. Status post total hip replacement, left     Plan: Return 1 month no x-ray needed on return.  Patient has some erythema over her heel and swelling in her foot has been sitting in a wheelchair most of the day.  She needs to keep pressure off her heel when she lays down she needs a pillow underneath her calf and not put her heel on any hard surface.  She needs to elevate her foot above her heart to help with the swelling and get up and walk more with her walker.  Staples were removed today.  Follow-Up Instructions: Return in about 1 month (around 05/08/2018).   Orders:  Orders Placed This Encounter  Procedures  . XR HIP UNILAT W OR W/O PELVIS 2-3 VIEWS LEFT   No orders of the defined types were placed in this encounter.   Imaging: No results found.  PMFS History: Patient Active Problem List   Diagnosis Date Noted  . Vitamin D deficiency 03/17/2018  . History of CVA (cerebrovascular accident) 03/17/2018  . Leukocytosis 03/17/2018  . HLD (hyperlipidemia) 03/17/2018  . Hip fracture ([place]) 03/14/2018  . Influenza A 12/26/2017  . CAP (community acquired pneumonia) 12/26/2017  . Acute respiratory failure with hypoxia ([place]) 12/26/2017  . Orthostatic hypotension 12/26/2017  . Hypokalemia 04/11/2015  . Near syncope 04/11/2015  . Abdominal pain 04/11/2015  . Hyponatremia 03/22/2015  . Loss of weight 03/22/2015  . COPD exacerbation ([place]) 03/12/2015  . Type 2 diabetes mellitus ([place]) 03/08/2015  . Essential hypertension 03/08/2015   Past Medical History:  Diagnosis Date  . Arthritis   . Blood transfusion without reported diagnosis   . COPD (chronic obstructive pulmonary disease) ([place])   .  Diabetes mellitus without complication ([place])   . Hypertension   . Stroke [facility])     Family History  Problem Relation Age of Onset  . Stroke Mother   . Cancer Father     Past Surgical History:  Procedure Laterality Date  . amputation of foot    . CHOLECYSTECTOMY    . HIP ARTHROPLASTY Left 03/15/2018   Procedure: ARTHROPLASTY BIPOLAR HIP (HEMIARTHROPLASTY);  Surgeon: Marybelle Killings, MD;  Location: [facility];  Service: Orthopedics;  Laterality: Left;   Social History   Occupational History  . Not on file  Tobacco Use  . Smoking status: Current Every Day Smoker    Packs/day: 1.00    Years: 60.00    Pack years: 60.00    Types: Cigarettes  . Smokeless tobacco: Never Used  . Tobacco comment: Has cut back lately.  Substance and Sexual Activity  . Alcohol use: No    Alcohol/week: 0.0 oz  . Drug use: No  . Sexual activity: Not on file

## 2018-05-05 ENCOUNTER — Ambulatory Visit (INDEPENDENT_AMBULATORY_CARE_PROVIDER_SITE_OTHER): Payer: Medicare Other | Admitting: Orthopaedic Surgery

## 2018-05-05 ENCOUNTER — Encounter (INDEPENDENT_AMBULATORY_CARE_PROVIDER_SITE_OTHER): Payer: Self-pay | Admitting: Orthopaedic Surgery

## 2018-05-05 VITALS — BP 175/67 | HR 61 | Ht 62.0 in | Wt 138.0 lb

## 2018-05-05 DIAGNOSIS — B351 Tinea unguium: Secondary | ICD-10-CM

## 2018-05-05 DIAGNOSIS — E1140 Type 2 diabetes mellitus with diabetic neuropathy, unspecified: Secondary | ICD-10-CM | POA: Diagnosis not present

## 2018-05-05 DIAGNOSIS — Z96649 Presence of unspecified artificial hip joint: Secondary | ICD-10-CM

## 2018-05-05 NOTE — Progress Notes (Signed)
Post-Op Visit Note   Patient: Stefanie Vincent           Date of Birth: 01-17-1941           MRN: [id number] Visit Date: 05/05/2018 PCP: Helane Rima, MD   Assessment & Plan: Post left hip bipolar hemiarthroplasty for left femoral neck fracture.  She is in a skilled facility and is using a walker for ambulation and is gradually working on increasing distance and increasing stamina.  She lives with her daughter who works during the day.  She also has a grandson there but he goes to high school. 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  Trimmed today left 1 thru 5 toe nails. Chief Complaint:  Chief Complaint  Patient presents with  . Left Hip - Routine Post Op, Follow-up   Visit Diagnoses:  1. S/P hip hemiarthroplasty     Plan: She will continue to work on endurance and distance ambulation with a walker.  When she is no longer fall risk she can consider being discharged home physical therapy.  We encouraged her to continue to work on ambulation attempts.  Patient had problems with overgrowth of her toenails that are thick she has not been able to trim them she had a nurse at the skilled facility partial trim them but this was unsuccessful.  She had problems with previous infection in the opposite foot and ended up having toe amputations.  She requested her toenails be trimmed and we did these with some shears debriding left toenails 1 through 5 removing the distal aspect of the thickened nail paring them down there is no bleeding.  We discussed nail care.  She has some toenail tremors that are for dog but states she cannot get down to her foot now after hemiarthroplasty.  She could have her daughter to this we discussed washing her foot afterwards and discussed foot care at length in relationship to type 2 diabetes.  I can check her back again on a as needed basis.  Follow-Up Instructions: No follow-ups on file.   Orders:  No orders of the defined types were placed in  this encounter.  No orders of the defined types were placed in this encounter.   Imaging: No results found.  PMFS History: Patient Active Problem List   Diagnosis Date Noted  . S/P hip hemiarthroplasty 04/08/2018  . Vitamin D deficiency 03/17/2018  . History of CVA (cerebrovascular accident) 03/17/2018  . Leukocytosis 03/17/2018  . HLD (hyperlipidemia) 03/17/2018  . Influenza A 12/26/2017  . CAP (community acquired pneumonia) 12/26/2017  . Acute respiratory failure with hypoxia ([place]) 12/26/2017  . Orthostatic hypotension 12/26/2017  . Hypokalemia 04/11/2015  . Near syncope 04/11/2015  . Abdominal pain 04/11/2015  . Hyponatremia 03/22/2015  . Loss of weight 03/22/2015  . COPD exacerbation ([place]) 03/12/2015  . Type 2 diabetes mellitus ([place]) 03/08/2015  . Essential hypertension 03/08/2015   Past Medical History:  Diagnosis Date  . Arthritis   . Blood transfusion without reported diagnosis   . COPD (chronic obstructive pulmonary disease) ([place])   . Diabetes mellitus without complication ([place])   . Hypertension   . Stroke [facility])     Family History  Problem Relation Age of Onset  . Stroke Mother   . Cancer Father     Past Surgical History:  Procedure Laterality Date  . amputation of foot    . CHOLECYSTECTOMY    . HIP ARTHROPLASTY Left 03/15/2018   Procedure: ARTHROPLASTY BIPOLAR HIP (  HEMIARTHROPLASTY);  Surgeon: Marybelle Killings, MD;  Location: [facility];  Service: Orthopedics;  Laterality: Left;   Social History   Occupational History  . Not on file  Tobacco Use  . Smoking status: Current Every Day Smoker    Packs/day: 1.00    Years: 60.00    Pack years: 60.00    Types: Cigarettes  . Smokeless tobacco: Never Used  . Tobacco comment: Has cut back lately.  Substance and Sexual Activity  . Alcohol use: No    Alcohol/week: 0.0 oz  . Drug use: No  . Sexual activity: Not on file

## 2018-05-13 ENCOUNTER — Telehealth (INDEPENDENT_AMBULATORY_CARE_PROVIDER_SITE_OTHER): Payer: Self-pay | Admitting: Orthopaedic Surgery

## 2018-05-13 NOTE — Telephone Encounter (Signed)
Ucall. Needs PT and Aid. Should not need nurse nor OT. thanks

## 2018-05-13 NOTE — Telephone Encounter (Signed)
Lavella Lemons from [facility] called advised she received a referral from [facility] asking for PT,OT,Aid and Nursing for the patient. Lavella Lemons advised patient is discharging home 05/15/18 and want to know if Dr Lorin Mercy will be following these orders. The number to contact Lavella Lemons is [phone]

## 2018-05-13 NOTE — Telephone Encounter (Signed)
Please advise. Per last office note, patient was going to follow up prn.

## 2018-05-14 NOTE — Telephone Encounter (Signed)
I left voicemail advising. ?

## 2018-05-15 ENCOUNTER — Telehealth (INDEPENDENT_AMBULATORY_CARE_PROVIDER_SITE_OTHER): Payer: Self-pay | Admitting: Orthopaedic Surgery

## 2018-05-15 NOTE — Telephone Encounter (Signed)
Received call from Loletha Grayer-  Account excecutive with [facility] concerning orders for the patient. I read note from Dr Lorin Mercy to her advising ok for PT and Aid but, no to OT and a nurse. The number to contact  Hoyle Sauer is [phone]

## 2018-05-18 ENCOUNTER — Telehealth (INDEPENDENT_AMBULATORY_CARE_PROVIDER_SITE_OTHER): Payer: Self-pay

## 2018-05-18 NOTE — Telephone Encounter (Signed)
Stefanie Vincent with [facility] would like verbal order for Home Health to start on 05/19/2018.  CB# is [phone].  Please advise.  Thank you.

## 2018-05-19 ENCOUNTER — Telehealth (INDEPENDENT_AMBULATORY_CARE_PROVIDER_SITE_OTHER): Payer: Self-pay | Admitting: Radiology

## 2018-05-19 NOTE — Telephone Encounter (Signed)
Stefanie Vincent per other note and advised ok for HHPT and aid, patient is set to start tomorrow.

## 2018-05-19 NOTE — Telephone Encounter (Signed)
Error, see other msg

## 2018-05-19 NOTE — Telephone Encounter (Signed)
Noted  

## 2018-05-29 ENCOUNTER — Telehealth (INDEPENDENT_AMBULATORY_CARE_PROVIDER_SITE_OTHER): Payer: Self-pay | Admitting: Radiology

## 2018-05-29 NOTE — Telephone Encounter (Signed)
Sharese with [facility] called to let you know that Stefanie Vincent has refused her Home Health aide.  CB for Stefanie Vincent is [phone]

## 2018-06-12 ENCOUNTER — Telehealth (INDEPENDENT_AMBULATORY_CARE_PROVIDER_SITE_OTHER): Payer: Self-pay

## 2018-06-12 NOTE — Telephone Encounter (Signed)
Phone call on vm for triage very difficult to understand who the caller was. cb # [phone] she advised that the pt's b/p was elevated? And that you were needing to know this information.

## 2018-06-12 NOTE — Telephone Encounter (Signed)
I called [phone] and spoke with Velina who is patient's HHPT.  She called to let us know patient's BP was elevated at her visit today.  She said it was 180/80 or 180/90, but the patient had not taken her blood pressure medication yet.  She did take it while therapist was there.  Therapist is concerned that patient is not taking her medication correctly and made mention to patient that maybe her daughter should help with it.  I did advise we do not manage patient's blood pressure medication, but I would send note to Dr. Lorin Mercy so that he would be aware.  I called to check on patient. Unable to leave message, voicemail has not been set up.

## 2018-06-12 NOTE — Telephone Encounter (Signed)
Fyi. Please see below.

## 2018-06-16 ENCOUNTER — Telehealth (INDEPENDENT_AMBULATORY_CARE_PROVIDER_SITE_OTHER): Payer: Self-pay | Admitting: Orthopaedic Surgery

## 2018-06-16 NOTE — Telephone Encounter (Signed)
Noted  

## 2018-06-16 NOTE — Telephone Encounter (Signed)
Please advise 

## 2018-06-16 NOTE — Telephone Encounter (Signed)
Ok , she is walking on her on and working on endurance. OK to stop PT

## 2018-06-16 NOTE — Telephone Encounter (Signed)
Stefanie Vincent-(PT) with [facility] called advised patient refused (PT) and wanted to be discharged. The number to contact Stefanie Vincent is [phone]

## 2018-06-17 NOTE — Telephone Encounter (Signed)
I left voicemail for Cherise and advised.

## 2019-04-02 ENCOUNTER — Emergency Department (HOSPITAL_COMMUNITY): Payer: Medicare Other

## 2019-04-02 ENCOUNTER — Encounter (HOSPITAL_COMMUNITY): Payer: Self-pay | Admitting: Emergency Medicine

## 2019-04-02 ENCOUNTER — Emergency Department (HOSPITAL_COMMUNITY)
Admission: EM | Admit: 2019-04-02 | Discharge: 2019-04-02 | Disposition: A | Discharge status: Home / Self Care | Payer: Medicare Other | Attending: Emergency Medicine | Admitting: Emergency Medicine

## 2019-04-02 ENCOUNTER — Other Ambulatory Visit: Payer: Self-pay

## 2019-04-02 DIAGNOSIS — E119 Type 2 diabetes mellitus without complications: Secondary | ICD-10-CM | POA: Diagnosis not present

## 2019-04-02 DIAGNOSIS — Z79899 Other long term (current) drug therapy: Secondary | ICD-10-CM | POA: Diagnosis not present

## 2019-04-02 DIAGNOSIS — I10 Essential (primary) hypertension: Secondary | ICD-10-CM | POA: Diagnosis not present

## 2019-04-02 DIAGNOSIS — Z8673 Personal history of transient ischemic attack (TIA), and cerebral infarction without residual deficits: Secondary | ICD-10-CM | POA: Insufficient documentation

## 2019-04-02 DIAGNOSIS — J449 Chronic obstructive pulmonary disease, unspecified: Secondary | ICD-10-CM | POA: Insufficient documentation

## 2019-04-02 DIAGNOSIS — E162 Hypoglycemia, unspecified: Secondary | ICD-10-CM | POA: Diagnosis not present

## 2019-04-02 DIAGNOSIS — F17210 Nicotine dependence, cigarettes, uncomplicated: Secondary | ICD-10-CM | POA: Insufficient documentation

## 2019-04-02 DIAGNOSIS — Z7982 Long term (current) use of aspirin: Secondary | ICD-10-CM | POA: Diagnosis not present

## 2019-04-02 DIAGNOSIS — R4182 Altered mental status, unspecified: Secondary | ICD-10-CM | POA: Diagnosis present

## 2019-04-02 LAB — CBC WITH DIFFERENTIAL/PLATELET
Abs Immature Granulocytes: 0.03 10*3/uL (ref 0.00–0.07)
Basophils Absolute: 0.1 10*3/uL (ref 0.0–0.1)
Basophils Relative: 1 %
Eosinophils Absolute: 0.1 10*3/uL (ref 0.0–0.5)
Eosinophils Relative: 1 %
HCT: 35.9 % — ABNORMAL LOW (ref 36.0–46.0)
Hemoglobin: 11.8 g/dL — ABNORMAL LOW (ref 12.0–15.0)
Immature Granulocytes: 0 %
Lymphocytes Relative: 19 %
Lymphs Abs: 1.6 10*3/uL (ref 0.7–4.0)
MCH: 28.2 pg (ref 26.0–34.0)
MCHC: 32.9 g/dL (ref 30.0–36.0)
MCV: 85.9 fL (ref 80.0–100.0)
Monocytes Absolute: 0.4 10*3/uL (ref 0.1–1.0)
Monocytes Relative: 5 %
Neutro Abs: 6.4 10*3/uL (ref 1.7–7.7)
Neutrophils Relative %: 74 %
Platelets: 220 10*3/uL (ref 150–400)
RBC: 4.18 MIL/uL (ref 3.87–5.11)
RDW: 13.8 % (ref 11.5–15.5)
WBC: 8.6 10*3/uL (ref 4.0–10.5)
nRBC: 0 % (ref 0.0–0.2)

## 2019-04-02 LAB — URINALYSIS, ROUTINE W REFLEX MICROSCOPIC
Bilirubin Urine: NEGATIVE
Glucose, UA: NEGATIVE mg/dL
Hgb urine dipstick: NEGATIVE
Ketones, ur: NEGATIVE mg/dL
Leukocytes,Ua: NEGATIVE
Nitrite: NEGATIVE
Protein, ur: 30 mg/dL — AB
Specific Gravity, Urine: 1.009 (ref 1.005–1.030)
pH: 6 (ref 5.0–8.0)

## 2019-04-02 LAB — COMPREHENSIVE METABOLIC PANEL
ALT: 12 U/L (ref 0–44)
AST: 18 U/L (ref 15–41)
Albumin: 3.5 g/dL (ref 3.5–5.0)
Alkaline Phosphatase: 81 U/L (ref 38–126)
Anion gap: 10 (ref 5–15)
BUN: 13 mg/dL (ref 8–23)
CO2: 25 mmol/L (ref 22–32)
Calcium: 9.3 mg/dL (ref 8.9–10.3)
Chloride: 102 mmol/L (ref 98–111)
Creatinine, Ser: 1.08 mg/dL — ABNORMAL HIGH (ref 0.44–1.00)
GFR calc Af Amer: 57 mL/min — ABNORMAL LOW (ref >60)
GFR calc non Af Amer: 49 mL/min — ABNORMAL LOW (ref >60)
Glucose, Bld: 84 mg/dL (ref 70–99)
Potassium: 3 mmol/L — ABNORMAL LOW (ref 3.5–5.1)
Sodium: 137 mmol/L (ref 135–145)
Total Bilirubin: 0.3 mg/dL (ref 0.3–1.2)
Total Protein: 6.3 g/dL — ABNORMAL LOW (ref 6.5–8.1)

## 2019-04-02 LAB — CBG MONITORING, ED
Glucose-Capillary: 82 mg/dL (ref 70–99)
Glucose-Capillary: 79 mg/dL (ref 70–99)
Glucose-Capillary: 89 mg/dL (ref 70–99)
Glucose-Capillary: 99 mg/dL (ref 70–99)
Glucose-Capillary: 111 mg/dL — ABNORMAL HIGH (ref 70–99)

## 2019-04-02 LAB — TROPONIN I: Troponin I: <0.03 ng/mL (ref <0.03)

## 2019-04-02 MED ORDER — SODIUM CHLORIDE 0.9 % IV BOLUS
1000.0000 mL | Freq: Once | INTRAVENOUS | Status: AC
  Administered 2019-04-02: 19:00:00 1000 mL via INTRAVENOUS

## 2019-04-02 MED ORDER — LOSARTAN POTASSIUM 50 MG PO TABS
100.0000 mg | ORAL_TABLET | Freq: Once | ORAL | Status: AC
  Administered 2019-04-02: 21:00:00 100 mg via ORAL
  Filled 2019-04-02: qty 2

## 2019-04-02 MED ORDER — AMLODIPINE BESYLATE 5 MG PO TABS: 10.0000 mg | ORAL_TABLET | Freq: Once | ORAL | Status: DC

## 2019-04-02 NOTE — ED Notes (Signed)
EDP will contact family

## 2019-04-02 NOTE — ED Notes (Signed)
Contact information:   [facility] [address]  [phone]   [phone]

## 2019-04-02 NOTE — ED Notes (Signed)
Pt self administered a beta blocker after lunch.  HR 49

## 2019-04-02 NOTE — ED Provider Notes (Signed)
[facility] EMERGENCY DEPARTMENT Provider Note   CSN: [id number] Arrival date & time: 04/02/19  1815    History   Chief Complaint No chief complaint on file.   HPI Stefanie Vincent is a 78 y.o. female history of COPD, diabetes, hypertension, previous stroke here presenting with altered mental status.  Patient lives at home with family.  Per the daughter, patient was fine around 5:45 PM.  The daughter came home at that time and talk to the patient.  The daughter then went to pick up something in the back of her trunk and came back about 5 minutes later and noticed that patient was completely altered.  Patient usually does not eat that much but has no vomiting or falls.  When EMS got there, her CBG was 21.  She was given some orange juice by EMS and her blood sugar increased to about 97.  Per the daughter, patient had similar episodes with hypoglycemia previously. She also was noted to be hypertensive per EMS but is a history of that and did take her medicines this morning.      The history is provided by the patient and the EMS personnel.    Past Medical History:  Diagnosis Date  . Arthritis   . Blood transfusion without reported diagnosis   . COPD (chronic obstructive pulmonary disease) ([place])   . Diabetes mellitus without complication ([place])   . Hypertension   . Stroke [facility])     Patient Active Problem List   Diagnosis Date Noted  . S/P hip hemiarthroplasty 04/08/2018  . Vitamin D deficiency 03/17/2018  . History of CVA (cerebrovascular accident) 03/17/2018  . Leukocytosis 03/17/2018  . HLD (hyperlipidemia) 03/17/2018  . Influenza A 12/26/2017  . CAP (community acquired pneumonia) 12/26/2017  . Acute respiratory failure with hypoxia ([place]) 12/26/2017  . Orthostatic hypotension 12/26/2017  . Hypokalemia 04/11/2015  . Near syncope 04/11/2015  . Abdominal pain 04/11/2015  . Hyponatremia 03/22/2015  . Loss of weight 03/22/2015  . COPD exacerbation ([place])  03/12/2015  . Type 2 diabetes mellitus ([place]) 03/08/2015  . Essential hypertension 03/08/2015    Past Surgical History:  Procedure Laterality Date  . amputation of foot    . CHOLECYSTECTOMY    . HIP ARTHROPLASTY Left 03/15/2018   Procedure: ARTHROPLASTY BIPOLAR HIP (HEMIARTHROPLASTY);  Surgeon: Marybelle Killings, MD;  Location: [facility];  Service: Orthopedics;  Laterality: Left;     OB History   No obstetric history on file.      Home Medications    Prior to Admission medications   Medication Sig Start Date End Date Taking? Authorizing Provider  albuterol (PROVENTIL) (2.5 MG/3ML) 0.083% nebulizer solution Take 3 mLs (2.5 mg total) by nebulization every 6 (six) hours as needed for wheezing or shortness of breath. 01/02/18   Arrien, Jimmy Picket, MD  aspirin EC 325 MG EC tablet Take 1 tablet (325 mg total) by mouth daily with breakfast. 03/16/18   Marybelle Killings, MD  atenolol (TENORMIN) 50 MG tablet Take 1 tablet (50 mg total) by mouth daily. 04/11/15   Moding, Langley Gauss, MD  docusate sodium (COLACE) 100 MG capsule Take 1 capsule (100 mg total) by mouth 2 (two) times daily. 03/17/18   Raiford Noble Latif, DO  feeding supplement, ENSURE ENLIVE, (ENSURE ENLIVE) LIQD Take 237 mLs by mouth 2 (two) times daily between meals. 03/17/18   Raiford Noble Latif, DO  HYDROcodone-acetaminophen (NORCO/VICODIN) 5-325 MG tablet Take 1-2 tablets by mouth every 6 (six) hours  as needed for moderate pain. 03/16/18   Marybelle Killings, MD  insulin aspart protamine- aspart (NOVOLOG MIX 70/30) (70-30) 100 UNIT/ML injection Inject 0.1 mLs (10 Units total) into the skin 2 (two) times daily with a meal. Patient taking differently: Inject 2 Units into the skin 2 (two) times daily with a meal. Per sliding scale 03/14/15   Corky Sox, MD  loratadine (CLARITIN) 10 MG tablet Take 10 mg by mouth daily after breakfast.     [provider]  losartan (COZAAR) 25 MG tablet Take 1 tablet (25 mg total) by mouth daily. Patient not  taking: Reported on 12/26/2017 04/11/15   Moding, Langley Gauss, MD  omeprazole (PRILOSEC) 20 MG capsule Take 20 mg by mouth daily after breakfast.     [provider]  ondansetron (ZOFRAN) 4 MG tablet Take 1 tablet (4 mg total) by mouth every 6 (six) hours as needed for nausea. 03/17/18   Raiford Noble Latif, DO  polyethylene glycol [facility] / GLYCOLAX) packet Take 17 g by mouth daily as needed for moderate constipation. 01/02/18   Arrien, Jimmy Picket, MD  pravastatin (PRAVACHOL) 40 MG tablet Take 40 mg by mouth daily after breakfast.  12/16/17   [provider]  tiotropium (SPIRIVA) 18 MCG inhalation capsule Place 1 capsule (18 mcg total) into inhaler and inhale daily. Patient not taking: Reported on 12/26/2017 03/14/15   McLean-Scocuzza, Nino Glow, MD  vitamin D, CHOLECALCIFEROL, 400 UNITS tablet Take 400 Units by mouth daily.    [provider]    Family History Family History  Problem Relation Age of Onset  . Stroke Mother   . Cancer Father     Social History Social History   Tobacco Use  . Smoking status: Current Every Day Smoker    Packs/day: 1.00    Years: 60.00    Pack years: 60.00    Types: Cigarettes  . Smokeless tobacco: Never Used  . Tobacco comment: Has cut back lately.  Substance Use Topics  . Alcohol use: No    Alcohol/week: 0.0 standard drinks  . Drug use: No     Allergies   Codeine   Review of Systems Review of Systems  Psychiatric/Behavioral: Positive for confusion.  All other systems reviewed and are negative.    Physical Exam Updated Vital Signs BP (!) 209/69   Pulse (!) 48   Temp (!) 96.5 F (35.8 C) (Temporal)   Resp 16   SpO2 97%   Physical Exam Vitals signs and nursing note reviewed.  Constitutional:      Comments: Alert, oriented   HENT:     Head: Normocephalic and atraumatic.     Nose: Nose normal.     Mouth/Throat:     Mouth: Mucous membranes are moist.  Eyes:     Extraocular Movements: Extraocular movements  intact.     Pupils: Pupils are equal, round, and reactive to light.  Neck:     Musculoskeletal: Normal range of motion.  Cardiovascular:     Rate and Rhythm: Normal rate and regular rhythm.     Pulses: Normal pulses.     Heart sounds: Normal heart sounds.  Pulmonary:     Effort: Pulmonary effort is normal.     Breath sounds: Normal breath sounds.  Abdominal:     General: Abdomen is flat.     Palpations: Abdomen is soft.  Musculoskeletal:     Comments: Bruising R shoulder area that appears old, nl ROM of the shoulder. No midline spinal  tenderness, nl ROM bilateral hips. No obvious extremity trauma   Skin:    General: Skin is warm.     Capillary Refill: Capillary refill takes less than 2 seconds.  Neurological:     General: No focal deficit present.     Comments: A & O x 2, moving all extremities   Psychiatric:        Mood and Affect: Mood normal.        Behavior: Behavior normal.      ED Treatments / Results  Labs (all labs ordered are listed, but only abnormal results are displayed) Labs Reviewed  CBC WITH DIFFERENTIAL/PLATELET - Abnormal; Notable for the following components:      Result Value   Hemoglobin 11.8 (*)    HCT 35.9 (*)    All other components within normal limits  COMPREHENSIVE METABOLIC PANEL  TROPONIN I  URINALYSIS, ROUTINE W REFLEX MICROSCOPIC  CBG MONITORING, ED  CBG MONITORING, ED  CBG MONITORING, ED    EKG EKG Interpretation  Date/Time:  Friday Apr 02 2019 18:35:47 EDT Ventricular Rate:  48 PR Interval:    QRS Duration: 105 QT Interval:  511 QTC Calculation: 457 R Axis:   40 Text Interpretation:  Sinus bradycardia Probable anteroseptal infarct, old No significant change since last tracing Confirmed by Wandra Arthurs [phone]) on 04/02/2019 6:46:49 PM   Radiology Dg Chest Port 1 View  Result Date: 04/02/2019 CLINICAL DATA:  Altered mental status EXAM: PORTABLE CHEST 1 VIEW COMPARISON:  04/11/2015 FINDINGS: Mildly low lung volumes. No focal  airspace disease or effusion. Cardiomediastinal silhouette within normal limits. Aortic atherosclerosis. No pneumothorax. IMPRESSION: No active disease. Electronically Signed   By: Donavan Foil M.D.   On: 04/02/2019 19:07    Procedures Procedures (including critical care time)  Medications Ordered in ED Medications  losartan (COZAAR) tablet 100 mg (has no administration in time range)  sodium chloride 0.9 % bolus 1,000 mL (1,000 mLs Intravenous New Bag/Given 04/02/19 1903)     Initial Impression / Assessment and Plan / ED Course  I have reviewed the triage vital signs and the nursing notes.  Pertinent labs & imaging results that were available during my care of the patient were reviewed by me and considered in my medical decision making (see chart for details).       Stefanie Vincent is a 78 y.o. female here with hypoglycemia.  She has a history of diabetes and is on 70/30 insulin.  Patient is back to baseline now that her CBG is normal.  We will continue to monitor CBG and get labs and look for reversible causes with CT head, CXR, UA.   10:01 PM Patient's CT head unremarkable. Labs and CXR and UA unremarkable. Monitor patient for 4 hours and CBG stable. Patient back to baseline. Stable for discharge    Final Clinical Impressions(s) / ED Diagnoses   Final diagnoses:  None    ED Discharge Orders    None       Drenda Freeze, MD 04/02/19 2203

## 2019-04-02 NOTE — ED Triage Notes (Signed)
Per EMS: Pt from home with family. Pt found unconscious by family with  a CBG of 21. Pt was given coke and OJ. Repeat CBG 97. Pt A/O x4 upon arrival. Pt reports left sided weakness from a previous stroke, though ambulates without assistance at baseline.   EMS vitals:   CBG 97 BP 178/78 RR 16 Sp02 98% Temp 97 Temporal

## 2019-04-02 NOTE — Discharge Instructions (Addendum)
Please hold your insulin today.   See your doctor.   Please make sure she eats and drinks normally   Return to ER if she is confused, fever, vomiting.

## 2019-04-02 NOTE — ED Notes (Signed)
Pt given orange juice, cheese, and [place] sandwich.

## 2019-10-17 IMAGING — CT CT HEAD WITHOUT CONTRAST
4 series · 16 of 47 positions shown, 18 images · non-contrast
Comparison: None.

CLINICAL DATA: Altered LOC

EXAM:
CT HEAD WITHOUT CONTRAST
TECHNIQUE: Contiguous axial images were obtained from the base of the skull
through the vertex without intravenous contrast.

[Series 3: head without · axial · non-contrast · 0.44mm/px · z∈[-150,-25]mm · 7 of 35 slices shown, 9 images]
[im 5/35  brain]
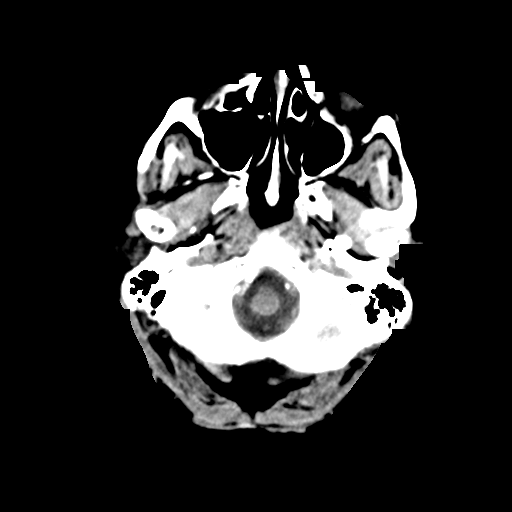
[im 5/35  bone]
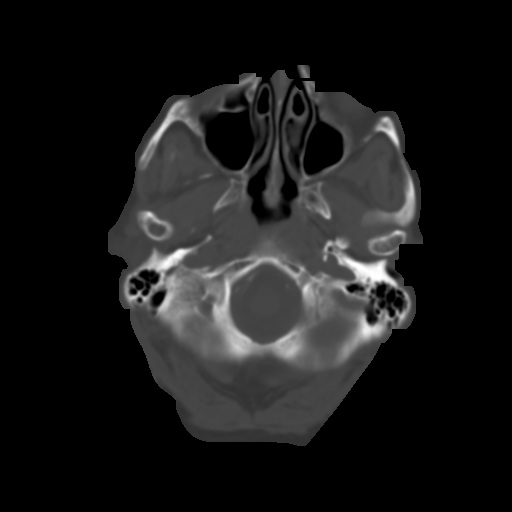
[im 9/35  brain]
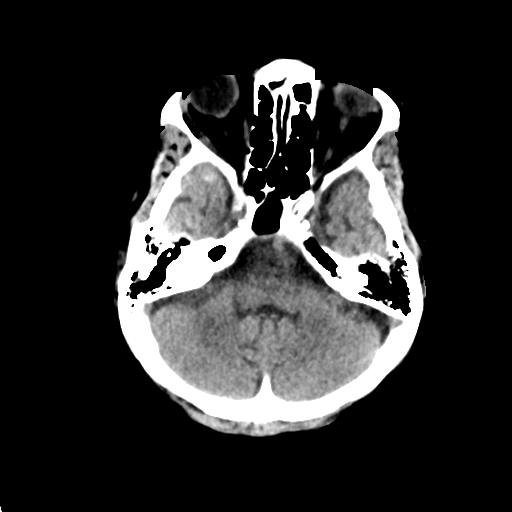
[im 13/35  brain]
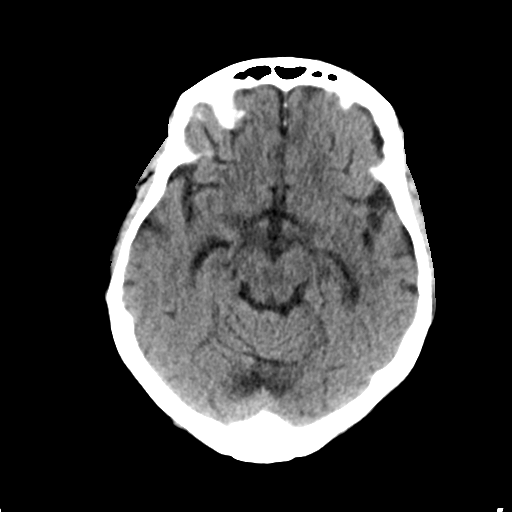
[im 18/35  brain]
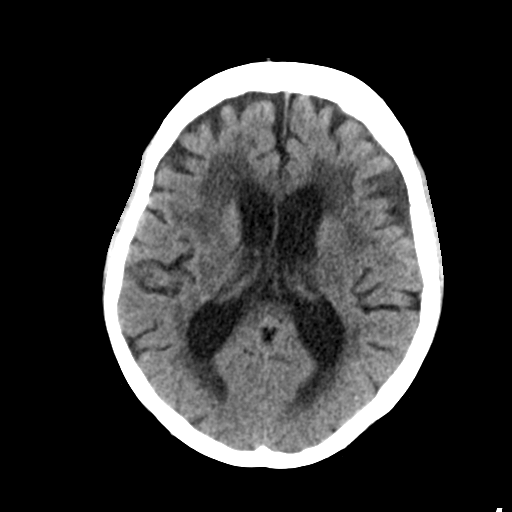
[im 22/35  brain]
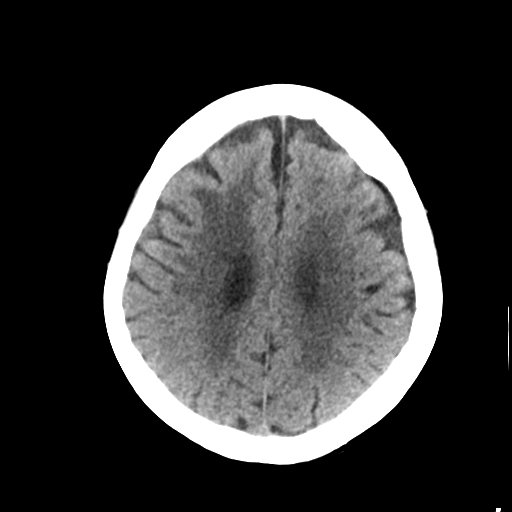
[im 22/35  bone]
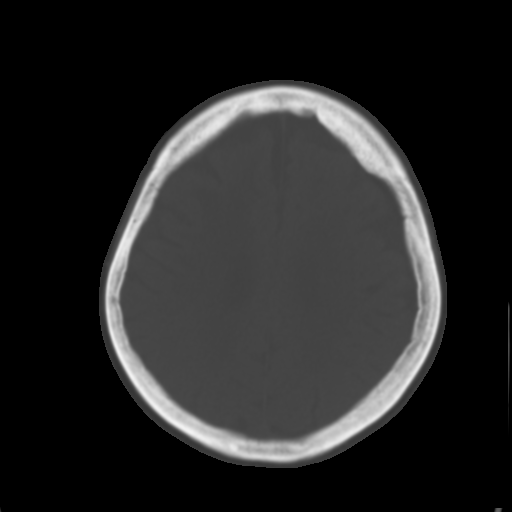
[im 26/35  brain]
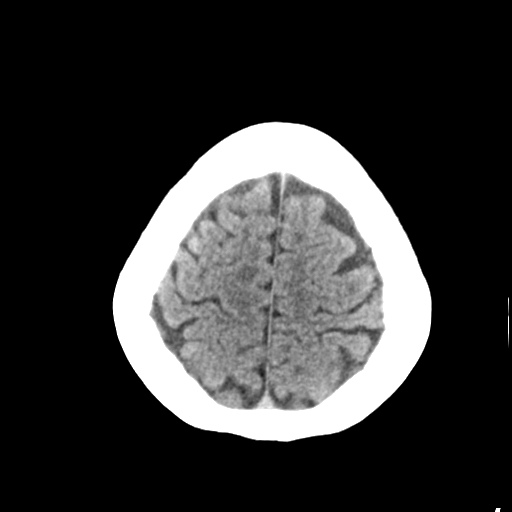
[im 30/35  brain]
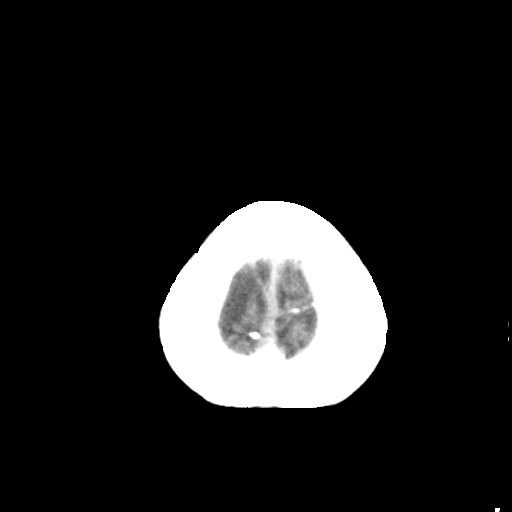

[Series 4: head bone · axial · 0.44mm/px · z∈[-154,-120]mm · 3 of 87 slices shown]
[im 9/87  bone]
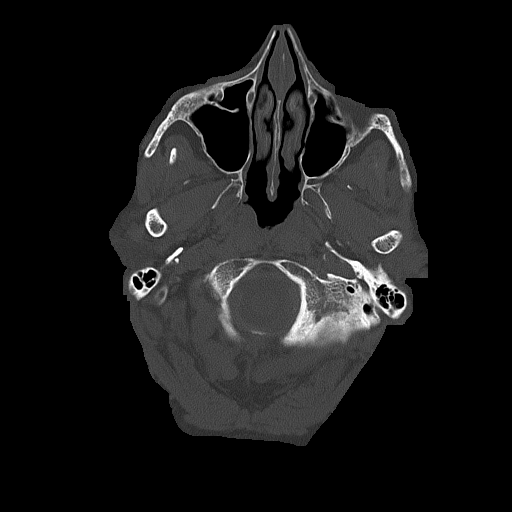
[im 18/87  bone]
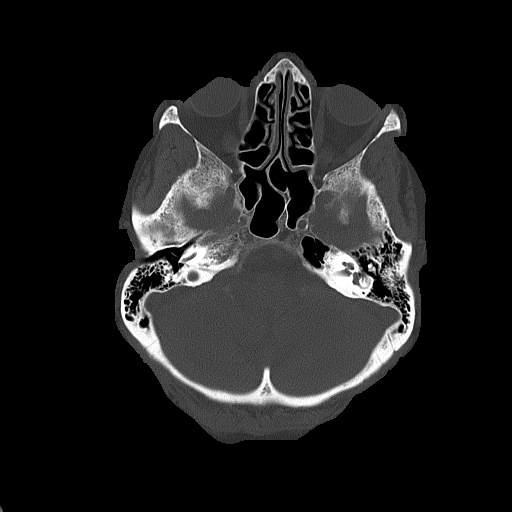
[im 26/87  bone]
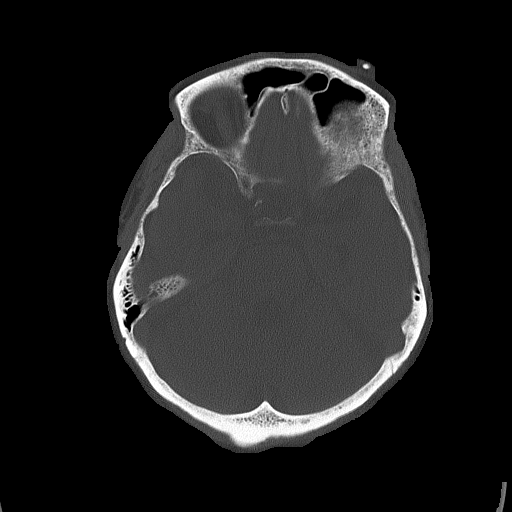

[Series 5: head without cor · coronal · non-contrast · 0.33mm/px · 3 of 67 slices shown]
[im 23/67  brain]
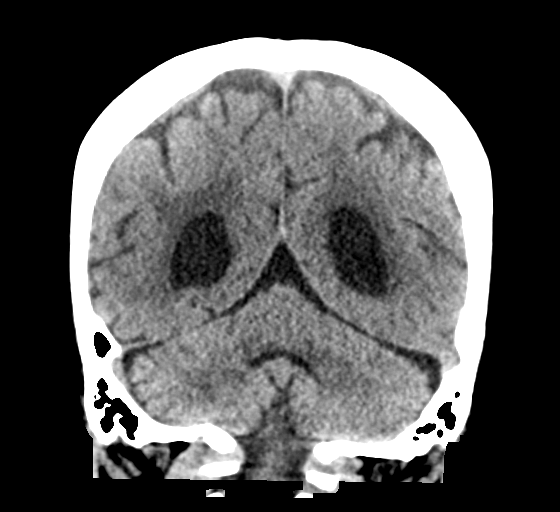
[im 30/67  brain]
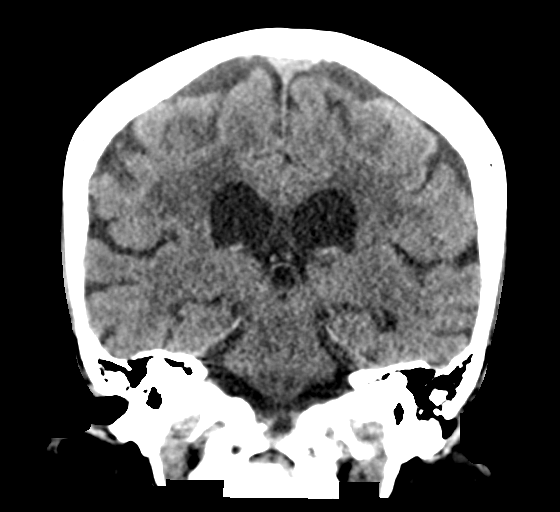
[im 37/67  brain]
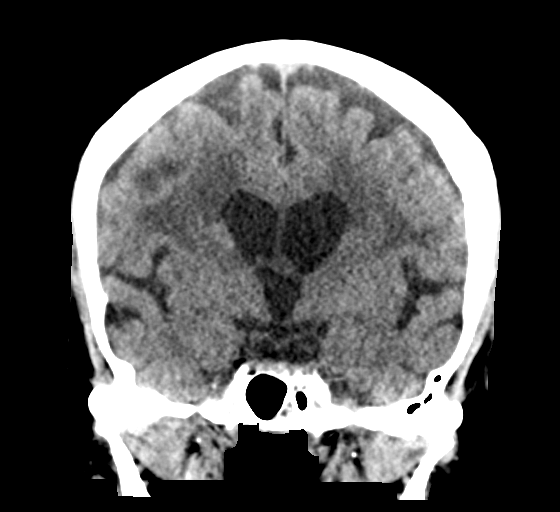

[Series 6: head without sag · sagittal · non-contrast · 0.33mm/px · 3 of 58 slices shown]
[im 20/58  brain]
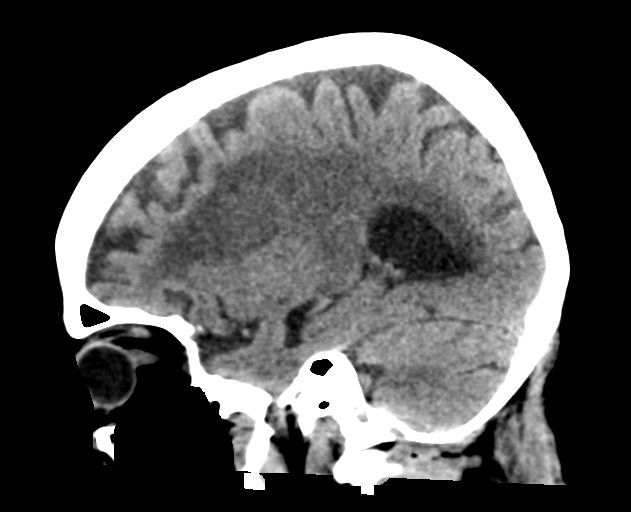
[im 29/58  brain]
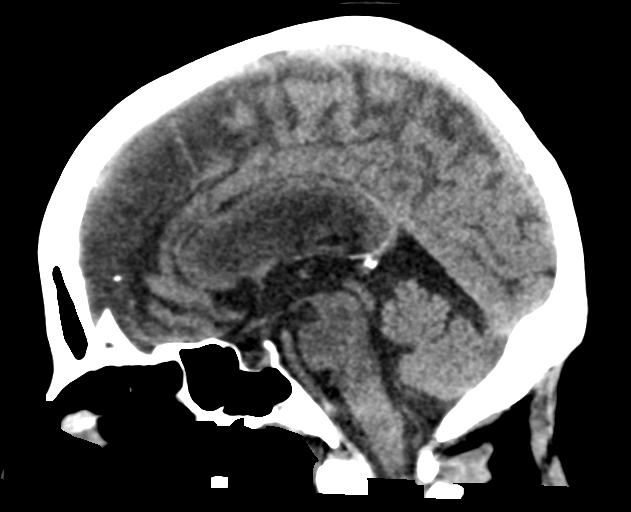
[im 39/58  brain]
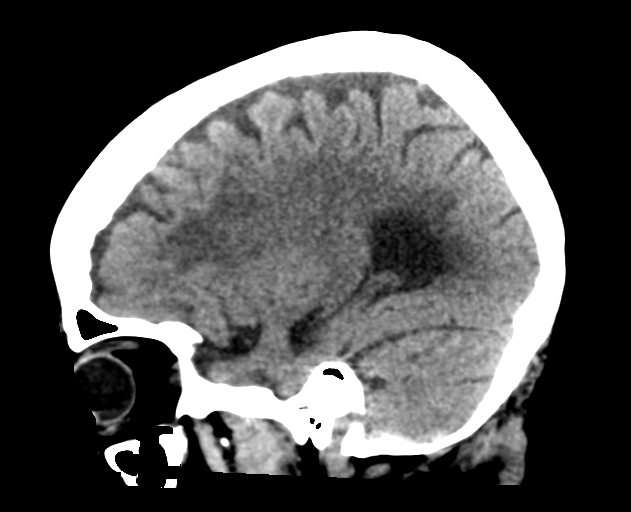

[16 of 47 positions shown; findings below may reference images not displayed]

FINDINGS: Brain: No acute territorial infarction, hemorrhage or intracranial
mass. Moderate atrophy. Extensive hypodensity within the
periventricular, deep, and subcortical white matter consistent with
small vessel ischemic change. Age indeterminate hypodensity in the
right basal ganglia and left thalamus. Mildly prominent ventricles
felt secondary to atrophy

Vascular: No hyperdense vessel.  Carotid vascular calcification

Skull: Normal. Negative for fracture or focal lesion.

Sinuses/Orbits: No acute finding.

Other: Punctate calcification or possible small foreign body within
the left forehead and midline soft tissues.
IMPRESSION: 1. No definite acute intracranial abnormality.
2. Age indeterminate hypodensities within the right basal ganglia
and left thalamus, possible lacunar infarcts.
3. Atrophy and small vessel ischemic changes of the white matter

## 2022-05-04 DEATH — deceased
